# Patient Record
Sex: Female | Born: 1960 | Race: Black or African American | Hispanic: No | Marital: Married | Smoking: Never smoker
Health system: Southern US, Community
[De-identification: ages and names within clinical notes are randomized; demographics above are authoritative.]

## PROBLEM LIST (undated history)

## (undated) DIAGNOSIS — G459 Transient cerebral ischemic attack, unspecified: Secondary | ICD-10-CM

## (undated) DIAGNOSIS — E7800 Pure hypercholesterolemia, unspecified: Secondary | ICD-10-CM

## (undated) DIAGNOSIS — I10 Essential (primary) hypertension: Secondary | ICD-10-CM

## (undated) DIAGNOSIS — Z8489 Family history of other specified conditions: Secondary | ICD-10-CM

## (undated) DIAGNOSIS — E119 Type 2 diabetes mellitus without complications: Secondary | ICD-10-CM

## (undated) DIAGNOSIS — G4733 Obstructive sleep apnea (adult) (pediatric): Secondary | ICD-10-CM

## (undated) DIAGNOSIS — K219 Gastro-esophageal reflux disease without esophagitis: Secondary | ICD-10-CM

## (undated) DIAGNOSIS — M797 Fibromyalgia: Secondary | ICD-10-CM

## (undated) DIAGNOSIS — R011 Cardiac murmur, unspecified: Secondary | ICD-10-CM

## (undated) DIAGNOSIS — E669 Obesity, unspecified: Secondary | ICD-10-CM

## (undated) DIAGNOSIS — F419 Anxiety disorder, unspecified: Secondary | ICD-10-CM

## (undated) DIAGNOSIS — M1990 Unspecified osteoarthritis, unspecified site: Secondary | ICD-10-CM

## (undated) DIAGNOSIS — F329 Major depressive disorder, single episode, unspecified: Secondary | ICD-10-CM

## (undated) DIAGNOSIS — F32A Depression, unspecified: Secondary | ICD-10-CM

## (undated) HISTORY — PX: CHOLECYSTECTOMY: SHX55

## (undated) HISTORY — PX: CARPAL TUNNEL RELEASE: SHX101

## (undated) HISTORY — DX: Anxiety disorder, unspecified: F41.9

## (undated) HISTORY — PX: APPENDECTOMY: SHX54

## (undated) HISTORY — PX: TONSILLECTOMY: SUR1361

## (undated) HISTORY — PX: NASAL SINUS SURGERY: SHX719

## (undated) HISTORY — DX: Fibromyalgia: M79.7

## (undated) HISTORY — PX: KNEE SURGERY: SHX244

---
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
## 1982-12-24 HISTORY — PX: CHOLECYSTECTOMY: SHX55

## 1985-12-24 HISTORY — PX: CARPAL TUNNEL RELEASE: SHX101

## 1990-12-24 HISTORY — PX: NASAL SINUS SURGERY: SHX719

## 1993-12-24 HISTORY — PX: KNEE SURGERY: SHX244

## 2010-12-24 DIAGNOSIS — G459 Transient cerebral ischemic attack, unspecified: Secondary | ICD-10-CM

## 2010-12-24 HISTORY — DX: Transient cerebral ischemic attack, unspecified: G45.9

## 2015-09-21 ENCOUNTER — Inpatient Hospital Stay (HOSPITAL_BASED_OUTPATIENT_CLINIC_OR_DEPARTMENT_OTHER): Payer: Medicaid Other

## 2015-09-21 ENCOUNTER — Encounter (HOSPITAL_BASED_OUTPATIENT_CLINIC_OR_DEPARTMENT_OTHER): Payer: Self-pay

## 2015-09-21 ENCOUNTER — Observation Stay (HOSPITAL_BASED_OUTPATIENT_CLINIC_OR_DEPARTMENT_OTHER)
Admission: EM | Admit: 2015-09-21 | Discharge: 2015-09-22 | Disposition: A | Discharge status: Home / Self Care | Payer: Medicaid Other | Attending: Internal Medicine | Admitting: Internal Medicine

## 2015-09-21 ENCOUNTER — Emergency Department (HOSPITAL_BASED_OUTPATIENT_CLINIC_OR_DEPARTMENT_OTHER): Payer: Medicaid Other

## 2015-09-21 DIAGNOSIS — R079 Chest pain, unspecified: Principal | ICD-10-CM | POA: Diagnosis present

## 2015-09-21 DIAGNOSIS — I10 Essential (primary) hypertension: Secondary | ICD-10-CM

## 2015-09-21 DIAGNOSIS — E109 Type 1 diabetes mellitus without complications: Secondary | ICD-10-CM | POA: Diagnosis not present

## 2015-09-21 DIAGNOSIS — R0789 Other chest pain: Secondary | ICD-10-CM | POA: Diagnosis not present

## 2015-09-21 DIAGNOSIS — E119 Type 2 diabetes mellitus without complications: Secondary | ICD-10-CM

## 2015-09-21 HISTORY — DX: Unspecified osteoarthritis, unspecified site: M19.90

## 2015-09-21 HISTORY — DX: Family history of other specified conditions: Z84.89

## 2015-09-21 HISTORY — DX: Obstructive sleep apnea (adult) (pediatric): G47.33

## 2015-09-21 HISTORY — DX: Pure hypercholesterolemia, unspecified: E78.00

## 2015-09-21 HISTORY — DX: Essential (primary) hypertension: I10

## 2015-09-21 HISTORY — DX: Transient cerebral ischemic attack, unspecified: G45.9

## 2015-09-21 HISTORY — DX: Obesity, unspecified: E66.9

## 2015-09-21 HISTORY — DX: Major depressive disorder, single episode, unspecified: F32.9

## 2015-09-21 HISTORY — DX: Cardiac murmur, unspecified: R01.1

## 2015-09-21 HISTORY — DX: Gastro-esophageal reflux disease without esophagitis: K21.9

## 2015-09-21 HISTORY — DX: Depression, unspecified: F32.A

## 2015-09-21 HISTORY — DX: Type 2 diabetes mellitus without complications: E11.9

## 2015-09-21 LAB — BASIC METABOLIC PANEL
ANION GAP: 6 (ref 5–15)
BUN: 13 mg/dL (ref 6–20)
CHLORIDE: 108 mmol/L (ref 101–111)
CO2: 26 mmol/L (ref 22–32)
Calcium: 8.8 mg/dL — ABNORMAL LOW (ref 8.9–10.3)
Creatinine, Ser: 0.86 mg/dL (ref 0.44–1.00)
GFR calc Af Amer: >60 mL/min (ref >60)
GFR calc non Af Amer: >60 mL/min (ref >60)
GLUCOSE: 142 mg/dL — AB (ref 65–99)
POTASSIUM: 3.7 mmol/L (ref 3.5–5.1)
Sodium: 140 mmol/L (ref 135–145)

## 2015-09-21 LAB — GLUCOSE, CAPILLARY
Glucose-Capillary: 98 mg/dL (ref 65–99)
Glucose-Capillary: 159 mg/dL — ABNORMAL HIGH (ref 65–99)
Glucose-Capillary: 149 mg/dL — ABNORMAL HIGH (ref 65–99)

## 2015-09-21 LAB — CBC WITH DIFFERENTIAL/PLATELET
BASOS ABS: 0 10*3/uL (ref 0.0–0.1)
Basophils Relative: 0 %
Eosinophils Absolute: 0.2 10*3/uL (ref 0.0–0.7)
Eosinophils Relative: 2 %
HEMATOCRIT: 38.7 % (ref 36.0–46.0)
HEMOGLOBIN: 12.9 g/dL (ref 12.0–15.0)
LYMPHS PCT: 42 %
Lymphs Abs: 4 10*3/uL (ref 0.7–4.0)
MCH: 25.3 pg — ABNORMAL LOW (ref 26.0–34.0)
MCHC: 33.3 g/dL (ref 30.0–36.0)
MCV: 75.9 fL — AB (ref 78.0–100.0)
MONO ABS: 0.5 10*3/uL (ref 0.1–1.0)
Monocytes Relative: 5 %
NEUTROS ABS: 4.8 10*3/uL (ref 1.7–7.7)
Neutrophils Relative %: 51 %
Platelets: 299 10*3/uL (ref 150–400)
RBC: 5.1 MIL/uL (ref 3.87–5.11)
RDW: 14.8 % (ref 11.5–15.5)
WBC: 9.5 10*3/uL (ref 4.0–10.5)

## 2015-09-21 LAB — TROPONIN I: Troponin I: <0.03 ng/mL (ref <0.031)

## 2015-09-21 MED ORDER — ACETAMINOPHEN 325 MG PO TABS
650.0000 mg | ORAL_TABLET | ORAL | Status: DC | PRN
Start: 2015-09-21 — End: 2015-09-21

## 2015-09-21 MED ORDER — ONDANSETRON HCL 4 MG PO TABS: 4.0000 mg | ORAL_TABLET | Freq: Four times a day (QID) | ORAL | Status: DC | PRN

## 2015-09-21 MED ORDER — SODIUM CHLORIDE 0.9 % IJ SOLN: 3.0000 mL | INTRAMUSCULAR | Status: DC | PRN

## 2015-09-21 MED ORDER — ONDANSETRON HCL 4 MG/2ML IJ SOLN: 4.0000 mg | Freq: Four times a day (QID) | INTRAMUSCULAR | Status: DC | PRN

## 2015-09-21 MED ORDER — HYDROCHLOROTHIAZIDE 12.5 MG PO CAPS
12.5000 mg | ORAL_CAPSULE | Freq: Every day | ORAL | Status: DC
  Administered 2015-09-21 – 2015-09-22 (×2): 12.5 mg via ORAL
  Filled 2015-09-21 (×2): qty 1

## 2015-09-21 MED ORDER — OXYCODONE HCL 5 MG PO TABS: 5.0000 mg | ORAL_TABLET | ORAL | Status: DC | PRN

## 2015-09-21 MED ORDER — SODIUM CHLORIDE 0.9 % IJ SOLN
3.0000 mL | Freq: Two times a day (BID) | INTRAMUSCULAR | Status: DC
  Administered 2015-09-21 – 2015-09-22 (×2): 3 mL via INTRAVENOUS

## 2015-09-21 MED ORDER — ASPIRIN 81 MG PO CHEW
324.0000 mg | CHEWABLE_TABLET | Freq: Once | ORAL | Status: AC
  Administered 2015-09-21: 324 mg via ORAL
  Filled 2015-09-21: qty 4

## 2015-09-21 MED ORDER — ALPRAZOLAM 0.25 MG PO TABS: 0.2500 mg | ORAL_TABLET | Freq: Two times a day (BID) | ORAL | Status: DC | PRN

## 2015-09-21 MED ORDER — GI COCKTAIL ~~LOC~~
30.0000 mL | Freq: Four times a day (QID) | ORAL | Status: DC | PRN
  Administered 2015-09-21 – 2015-09-22 (×2): 30 mL via ORAL
  Filled 2015-09-21 (×2): qty 30

## 2015-09-21 MED ORDER — ACETAMINOPHEN 650 MG RE SUPP: 650.0000 mg | Freq: Four times a day (QID) | RECTAL | Status: DC | PRN

## 2015-09-21 MED ORDER — LISINOPRIL-HYDROCHLOROTHIAZIDE 10-12.5 MG PO TABS: 1.0000 | ORAL_TABLET | Freq: Every day | ORAL | Status: DC

## 2015-09-21 MED ORDER — MAGNESIUM 30 MG PO TABS: 30.0000 mg | ORAL_TABLET | Freq: Every day | ORAL | Status: DC

## 2015-09-21 MED ORDER — LISINOPRIL 10 MG PO TABS
10.0000 mg | ORAL_TABLET | Freq: Every day | ORAL | Status: DC
  Administered 2015-09-21 – 2015-09-22 (×2): 10 mg via ORAL
  Filled 2015-09-21 (×2): qty 1

## 2015-09-21 MED ORDER — SIMVASTATIN 20 MG PO TABS
20.0000 mg | ORAL_TABLET | Freq: Every day | ORAL | Status: DC
  Administered 2015-09-21 – 2015-09-22 (×2): 20 mg via ORAL
  Filled 2015-09-21 (×2): qty 1

## 2015-09-21 MED ORDER — DOCUSATE SODIUM 100 MG PO CAPS
100.0000 mg | ORAL_CAPSULE | Freq: Two times a day (BID) | ORAL | Status: DC
  Administered 2015-09-21 – 2015-09-22 (×2): 100 mg via ORAL
  Filled 2015-09-21 (×2): qty 1

## 2015-09-21 MED ORDER — SODIUM CHLORIDE 0.9 % IV SOLN: 250.0000 mL | INTRAVENOUS | Status: DC | PRN

## 2015-09-21 MED ORDER — ENOXAPARIN SODIUM 60 MG/0.6ML ~~LOC~~ SOLN
0.5000 mg/kg | SUBCUTANEOUS | Status: DC
  Filled 2015-09-21: qty 0.6

## 2015-09-21 MED ORDER — ENOXAPARIN SODIUM 40 MG/0.4ML ~~LOC~~ SOLN: 40.0000 mg | SUBCUTANEOUS | Status: DC

## 2015-09-21 MED ORDER — INFLUENZA VAC SPLIT QUAD 0.5 ML IM SUSY
0.5000 mL | PREFILLED_SYRINGE | INTRAMUSCULAR | Status: AC
Start: 2015-09-22 — End: 2015-09-22
  Administered 2015-09-22: 0.5 mL via INTRAMUSCULAR
  Filled 2015-09-21: qty 0.5

## 2015-09-21 MED ORDER — MORPHINE SULFATE (PF) 2 MG/ML IV SOLN: 2.0000 mg | INTRAVENOUS | Status: DC | PRN

## 2015-09-21 MED ORDER — INSULIN ASPART 100 UNIT/ML ~~LOC~~ SOLN
0.0000 [IU] | Freq: Three times a day (TID) | SUBCUTANEOUS | Status: DC
  Administered 2015-09-21: 3 [IU] via SUBCUTANEOUS
  Administered 2015-09-22: 2 [IU] via SUBCUTANEOUS

## 2015-09-21 MED ORDER — ACETAMINOPHEN 325 MG PO TABS: 650.0000 mg | ORAL_TABLET | Freq: Four times a day (QID) | ORAL | Status: DC | PRN

## 2015-09-21 MED ORDER — NITROGLYCERIN 2 % TD OINT
TOPICAL_OINTMENT | TRANSDERMAL | Status: AC
  Administered 2015-09-21: 1 [in_us]
  Filled 2015-09-21: qty 1

## 2015-09-21 MED ORDER — ONDANSETRON 4 MG PO TBDP
4.0000 mg | ORAL_TABLET | Freq: Once | ORAL | Status: AC
  Administered 2015-09-21: 4 mg via ORAL
  Filled 2015-09-21: qty 1

## 2015-09-21 MED ORDER — SODIUM CHLORIDE 0.9 % IV SOLN
INTRAVENOUS | Status: DC
Start: 2015-09-21 — End: 2015-09-21

## 2015-09-21 NOTE — ED Notes (Signed)
[facility] has transferred patient at this time. Report called to [facility] at [place].

## 2015-09-21 NOTE — ED Notes (Signed)
Pt c/o lt arm pain waking her from sleep with lt upper back pain "pushing" through to lt chest, states has nausea and dizziness now, was sweating when woke up at 0330am

## 2015-09-21 NOTE — Progress Notes (Addendum)
54 year old female with history of diabetes, hypertension, hyperlipidemia who presents with left arm and chest pain. Patient reports that she woke up at 3:30 AM with pressure-like pain in her left arm. EKG shows no evidence of acute ST elevation. Patient was given full dose aspirin and nitroglycerin paste was applied. Chest pain started approximately 2-1/2 hours prior to evaluation. Initial workup reassuring including one negative troponin. On recheck, patient reports that her chest pain has improved with nitroglycerin. She currently has no chest pain is only experiencing 1/10 arm pain. Patient's heart score is 4Work up negative, heart score 4-5 , admitted to tele

## 2015-09-21 NOTE — H&P (Signed)
[facility] History and Physical  Shannon Rosario DGU:[id number] DOB: 1961-01-20 DOA: 09/21/2015  Referring physician: Emergency Department PCP: [facility]   Chief Complaint: chest pain  HPI: Shannon Rosario is a 54 y.o. female with history of type 2 diabetes, HTN, and obesity who was transferred from [facility] this am with chest pain and left arm pain. The pain originates from her back and radiates into left chest. The chest and left arm pain woke patient up around 3am today. Patient initially though pain was positional but it didn't improve with repositioning. Pain associated with nausea and diaphoresis but no SOB. Currently no chest pain but left arm still has numbness and squeezing sensation from shoulder to elbow. Patient had cardiology workup in [place] in 2012 for chest pain. Workup apparently negative.  She reports an abnormal EKG on annual exam last year with subsequent workup at [facility].    Patient reports an involuntary 15 pound weight loss since May. No other medical complaints.   ED workup: Unremarkable CBC except for microcytosis. Initial troponin negative.   Review of Systems:  Constitutional:  No weight loss, night sweats, Fevers, chills, fatigue.  HEENT:  No headaches, Difficulty swallowing,Tooth/dental problems,Sore throat,  No sneezing, itching, ear ache, nasal congestion, post nasal drip,  Cardio-vascular:  No Orthopnea, PND, swelling in lower extremities, anasarca, dizziness, palpitations  GI:  No heartburn, indigestion, abdominal pain, nausea, vomiting, diarrhea, change in bowel habits, loss of appetite  Resp:  No shortness of breath with exertion or at rest. No excess mucus, no productive cough, No non-productive cough, No coughing up of blood.No change in color of mucus.No wheezing.No chest wall deformity  Skin:  no rash or lesions.  GU:  no dysuria, change in color of urine, no urgency or frequency. No  flank pain.  Musculoskeletal:  No joint pain or swelling. No decreased range of motion.Psych:  No change in mood or affect. No depression or anxiety. No memory loss.   Past Medical History  Diagnosis Date  . Diabetes mellitus without complication   . Hypertension   . Arthritis    Past Surgical History  Procedure Laterality Date  . Cholecystectomy    . Appendectomy    . Cesarean section    . Knee surgery     Social History:  reports that she has never smoked. She does not have any smokeless tobacco history on file. She reports that she does not drink alcohol. Her drug history is not on file.   Lives at home with husband. No assistive devices needed at home.   Allergies  Allergen Reactions  . Dobutamine     FMH: CAD in mother, maternal grandmother  Prior to Admission medications   Medication Sig Start Date End Date Taking? Authorizing Provider  CALCIUM PO Take 1 capsule by mouth daily.   Yes Historical Provider, MD  cholecalciferol (VITAMIN D) 1000 UNITS tablet Take 1,000 Units by mouth daily.   Yes Historical Provider, MD  magnesium 30 MG tablet Take 30 mg by mouth daily.   Yes Historical Provider, MD  Omega-3 Fatty Acids (FISH OIL) 1000 MG CAPS Take 1 capsule by mouth daily.   Yes Historical Provider, MD  vitamin E 100 UNIT capsule Take 100 Units by mouth daily.   Yes Historical Provider, MD  lisinopril-hydrochlorothiazide (PRINZIDE,ZESTORETIC) 10-12.5 MG tablet Take 1 tablet by mouth daily.   Yes Historical Provider, MD  metFORMIN (GLUCOPHAGE) 1000 MG tablet Take 1,000 mg by mouth 2 (two) times  daily with a meal.   Yes Historical Provider, MD  omeprazole (PRILOSEC) 20 MG capsule Take 20 mg by mouth daily.   Yes Historical Provider, MD  simvastatin (ZOCOR) 20 MG tablet Take 20 mg by mouth daily.   Yes Historical Provider, MD   Physical Exam: Filed Vitals:   09/21/15 0556 09/21/15 0725 09/21/15 0734 09/21/15 0900  BP: 153/86 125/88 136/92 120/71  Pulse: 61 62 62 62  Temp:  98.9 F (37.2 C) 98.7 F (37.1 C) 98.7 F (37.1 C) 97.6 F (36.4 C)  TempSrc: Oral Oral  Oral  Resp: 18 16 18    Height: 5\' 9"  (1.753 m)   5\' 9"  (1.753 m)  Weight: 122.471 kg (270 lb)   118.2 kg (260 lb 9.3 oz)  SpO2: 99% 99% 99% 100%    Wt Readings from Last 3 Encounters:  09/21/15 118.2 kg (260 lb 9.3 oz)    General:  Pleasant obese, black female. Appears calm and comfortable Eyes: PERRL, normal lids, irises & conjunctiva ENT: grossly normal hearing, lips & tongue Neck: no LAD, masses or thyromegaly Cardiovascular: RRR, no m/r/g. No LE edema. Telemetry: SR, no arrhythmias  Respiratory: CTA bilaterally, no w/r/r. Normal respiratory effort. Abdomen: soft, ntnd Skin: no rash or induration seen on limited exam Musculoskeletal: grossly normal tone BUE/BLE. No chest wall tenderness Psychiatric: grossly normal mood and affect, speech fluent and appropriate Neurologic: grossly non-focal.          Labs on Admission:  Basic Metabolic Panel:  Recent Labs Lab 09/21/15 0609  NA 140  K 3.7  CL 108  CO2 26  GLUCOSE 142*  BUN 13  CREATININE 0.86  CALCIUM 8.8*    CBC:  Recent Labs Lab 09/21/15 0609  WBC 9.5  NEUTROABS 4.8  HGB 12.9  HCT 38.7  MCV 75.9*  PLT 299   Cardiac Enzymes:  Recent Labs Lab 09/21/15 0609  TROPONINI <0.03   CBG:  Recent Labs Lab 09/21/15 1118  GLUCAP 98    Radiological Exams on Admission: Dg Chest 2 View  09/21/2015   CLINICAL DATA:  Initial evaluation for acute chest pain.  EXAM: CHEST  2 VIEW  COMPARISON:  None.  FINDINGS: The cardiac and mediastinal silhouettes are within normal limits.  The lungs are normally inflated. Minimal left basilar atelectasis. No airspace consolidation, pleural effusion, or pulmonary edema is identified. There is no pneumothorax.  No acute osseous abnormality identified.  IMPRESSION: No active cardiopulmonary disease.   Electronically Signed   By: Jeannine Boga M.D.   On: 09/21/2015 06:21    EKG:  NSR. QT 461ms  Assessment/Plan  Chest pain / left arm pain. Normal initial troponin. Had stress test last year at [facility], will get results. Chest pain score 4-5. Will obtain 2D echo, Cardiology consult called. Fasting lipid panel in am  Diabetes. Obtain Hgb A1c.  Will hold home oral diabeteic medications and place on insulin while inpatient. Start       health healthy / carb modified diet when appropriate to eat.  Hypertension. Stable. Continue home anti-hypertensive medications.   Code Status: full code DVT Prophylaxis: Lovenox Family Communication: Patient alert, oriented and understands plan of care.  Disposition Plan: Discharge home in 24-48 hours.   Time spent: 60 minutes  [facility] Pager [phone]

## 2015-09-21 NOTE — Progress Notes (Signed)
  Echocardiogram 2D Echocardiogram has been performed.  Shannon Rosario 09/21/2015, 2:41 PM

## 2015-09-21 NOTE — ED Notes (Signed)
[facility] here at this time. 

## 2015-09-21 NOTE — ED Provider Notes (Signed)
CSN: [id number]     Arrival date & time 09/21/15  0547 History   First MD Initiated Contact with Patient 09/21/15 [phone]     Chief Complaint  Patient presents with  . Arm Pain     (Consider location/radiation/quality/duration/timing/severity/associated sxs/prior Treatment) HPI  This is a 54 year old female with history of diabetes, hypertension, hyperlipidemia who presents with left arm and chest pain. Patient reports that she woke up at 3:30 AM with pressure-like pain in her left arm. She describes it as "having a blood pressure cuff, arm." She also reports pain in her left upper back "pushing through" to her chest. She has associated nausea and dizziness. Also reported mild diaphoresis and shortness of breath. Reports that she had similar symptoms several years ago and had a full cardiac workup in [place]. This was approximately 5 years ago. Nothing makes the pain better or worse. Currently the pain is 4 out of 10. She denies any history of blood clots, recent hospitalization, recent surgery.  Reports that her mother had multiple heart attacks including one in her early 52s.  Past Medical History  Diagnosis Date  . Diabetes mellitus without complication   . Hypertension   . Arthritis    Past Surgical History  Procedure Laterality Date  . Cholecystectomy    . Appendectomy    . Cesarean section    . Knee surgery     No family history on file. Social History  Substance Use Topics  . Smoking status: Never Smoker   . Smokeless tobacco: None  . Alcohol Use: No   OB History    No data available     Review of Systems  Constitutional: Positive for diaphoresis. Negative for fever.  Respiratory: Positive for chest tightness. Negative for cough and shortness of breath.   Cardiovascular: Positive for chest pain. Negative for leg swelling.  Gastrointestinal: Positive for nausea. Negative for vomiting, abdominal pain and diarrhea.  Genitourinary: Negative for dysuria.  Musculoskeletal:  Negative for back pain.  Skin: Negative for wound.  Neurological: Negative for headaches.  Psychiatric/Behavioral: Negative for confusion.  All other systems reviewed and are negative.     Allergies  Dobutamine  Home Medications   Prior to Admission medications   Medication Sig Start Date End Date Taking? Authorizing Daivion Pape  lisinopril-hydrochlorothiazide (PRINZIDE,ZESTORETIC) 10-12.5 MG tablet Take 1 tablet by mouth daily.   Yes Historical Hurman Ketelsen, MD  metFORMIN (GLUCOPHAGE) 1000 MG tablet Take 1,000 mg by mouth 2 (two) times daily with a meal.   Yes Historical Keymon Mcelroy, MD  omeprazole (PRILOSEC) 20 MG capsule Take 20 mg by mouth daily.   Yes Historical Jadore Mcguffin, MD  simvastatin (ZOCOR) 20 MG tablet Take 20 mg by mouth daily.   Yes Historical Vauda Salvucci, MD   BP 153/86 mmHg  Pulse 61  Temp(Src) 98.9 F (37.2 C) (Oral)  Resp 18  Ht 5\' 9"  (1.753 m)  Wt 270 lb (122.471 kg)  BMI 39.85 kg/m2  SpO2 99% Physical Exam  Constitutional: She is oriented to person, place, and time. She appears well-developed and well-nourished.  Obese  HENT:  Head: Normocephalic and atraumatic.  Cardiovascular: Normal rate, regular rhythm and normal heart sounds.   No murmur heard. Pulmonary/Chest: Effort normal and breath sounds normal. No respiratory distress. She has no wheezes.  Abdominal: Soft. Bowel sounds are normal. There is no tenderness. There is no rebound.  Musculoskeletal: She exhibits no edema.  Neurological: She is alert and oriented to person, place, and time.  Skin: Skin is warm and  dry.  Psychiatric: She has a normal mood and affect.  Nursing note and vitals reviewed.   ED Course  Procedures (including critical care time) Labs Review Labs Reviewed  CBC WITH DIFFERENTIAL/PLATELET - Abnormal; Notable for the following:    MCV 75.9 (*)    MCH 25.3 (*)    All other components within normal limits  BASIC METABOLIC PANEL - Abnormal; Notable for the following:    Glucose, Bld  142 (*)    Calcium 8.8 (*)    All other components within normal limits  TROPONIN I    Imaging Review Dg Chest 2 View  09/21/2015   CLINICAL DATA:  Initial evaluation for acute chest pain.  EXAM: CHEST  2 VIEW  COMPARISON:  None.  FINDINGS: The cardiac and mediastinal silhouettes are within normal limits.  The lungs are normally inflated. Minimal left basilar atelectasis. No airspace consolidation, pleural effusion, or pulmonary edema is identified. There is no pneumothorax.  No acute osseous abnormality identified.  IMPRESSION: No active cardiopulmonary disease.   Electronically Signed   By: Jeannine Boga M.D.   On: 09/21/2015 06:21   I have personally reviewed and evaluated these images and lab results as part of my medical decision-making.   EKG Interpretation   Date/Time:  Wednesday September 21 2015 05:59:54 EDT Ventricular Rate:  66 PR Interval:  130 QRS Duration: 78 QT Interval:  418 QTC Calculation: 438 R Axis:   4 Text Interpretation:  Normal sinus rhythm Low voltage QRS Cannot rule out  Anterior infarct , age undetermined Abnormal ECG No prior for comparison  Confirmed by HORTON  MD, COURTNEY ([id number]) on 09/21/2015 5:59:21 AM      MDM   Final diagnoses:  Chest pain, unspecified chest pain type    Patient presents with chest and arm pain. Nontoxic on exam. Initial vital signs notable for blood pressure 153/86. Patient certainly has risk factors for ACS including hypertension, hyperlipidemia, obesity, and early family history. Description of pain is somewhat concerning. EKG shows no evidence of acute ST elevation. Patient was given full dose aspirin and nitroglycerin paste was applied. Chest pain started approximately 2-1/2 hours prior to evaluation. Initial workup reassuring including one negative troponin. On recheck, patient reports that her chest pain has improved with nitroglycerin. She currently has no chest pain is only experiencing 1/10 arm pain.  Patient's  heart score is 4. I feel best if patient were admitted for observation and formal chest pain rule out especially given her risk factors and improvement of pain with nitroglycerin. Patient is agreeable to plan.    Merryl Hacker, MD 09/21/15 [phone]

## 2015-09-21 NOTE — Consult Note (Signed)
CARDIOLOGY CONSULT NOTE     Patient ID: Shannon Rosario MRN: [id number] DOB/AGE: [birth year] 55 y.o.   Admit date: 09/21/2015 Referring Physician Reyne Dumas MD Primary Physician [facility] Primary Cardiologist N/A Reason for Consultation chest pain  HPI: 54 yo BF seen for evaluation of chest pain at the request of the hospitalist service. She reports that she awoke last night with pain in her left upper arm radiating to her upper left back and chest. She became hot and sweaty. No SOB. She thought initially her pain was from lying on her left side but persisted with change in position. She went to ED and admitted for further evaluation.  She has a history of DM type 2, HTN, HL, and family history of CAD. She reports prior Dobutamine stress test 4 yrs ago that led to a cardiac cath in [place]. This was reportedly normal. She also had extensive cardiac evaluation one year ago at [place] [facility] with a pharmacologic nuclear stress test that was normal.   Past Medical History  Diagnosis Date  . Diabetes mellitus without complication   . Hypertension   . Arthritis   . Hypercholesterolemia   . OSA (obstructive sleep apnea)   . Obesity     Family History  Problem Relation Age of Onset  . CAD Mother     Social History   Social History  . Marital Status: Married    Spouse Name: N/A  . Number of Children: N/A  . Years of Education: N/A   Occupational History  . teacher    Social History Main Topics  . Smoking status: Never Smoker   . Smokeless tobacco: Not on file  . Alcohol Use: No  . Drug Use: Not on file  . Sexual Activity: Not on file   Other Topics Concern  . Not on file   Social History Narrative  . No narrative on file    Past Surgical History  Procedure Laterality Date  . Cholecystectomy    . Appendectomy    . Cesarean section    . Knee surgery       Prescriptions prior to admission  Medication Sig Dispense Refill Last Dose   . CALCIUM PO Take 1 capsule by mouth daily.   09/20/2015 at Unknown time  . cholecalciferol (VITAMIN D) 1000 UNITS tablet Take 1,000 Units by mouth daily.   09/20/2015 at Unknown time  . lisinopril-hydrochlorothiazide (PRINZIDE,ZESTORETIC) 10-12.5 MG tablet Take 1 tablet by mouth daily.   09/20/2015 at Unknown time  . magnesium 30 MG tablet Take 30 mg by mouth daily.   09/20/2015 at Unknown time  . metFORMIN (GLUCOPHAGE) 1000 MG tablet Take 1,000 mg by mouth 2 (two) times daily with a meal.   09/20/2015 at Unknown time  . Omega-3 Fatty Acids (FISH OIL) 1000 MG CAPS Take 1 capsule by mouth daily.   09/20/2015 at Unknown time  . omeprazole (PRILOSEC) 20 MG capsule Take 20 mg by mouth daily.   09/20/2015 at Unknown time  . simvastatin (ZOCOR) 20 MG tablet Take 20 mg by mouth daily.   09/20/2015 at Unknown time  . vitamin E 100 UNIT capsule Take 100 Units by mouth daily.   09/20/2015 at Unknown time      Medication List    ASK your doctor about these medications        CALCIUM PO  Take 1 capsule by mouth daily.     cholecalciferol 1000 UNITS tablet  Commonly known as:  VITAMIN  D  Take 1,000 Units by mouth daily.     Fish Oil 1000 MG Caps  Take 1 capsule by mouth daily.     lisinopril-hydrochlorothiazide 10-12.5 MG tablet  Commonly known as:  PRINZIDE,ZESTORETIC  Take 1 tablet by mouth daily.     magnesium 30 MG tablet  Take 30 mg by mouth daily.     metFORMIN 1000 MG tablet  Commonly known as:  GLUCOPHAGE  Take 1,000 mg by mouth 2 (two) times daily with a meal.     omeprazole 20 MG capsule  Commonly known as:  PRILOSEC  Take 20 mg by mouth daily.     simvastatin 20 MG tablet  Commonly known as:  ZOCOR  Take 20 mg by mouth daily.     vitamin E 100 UNIT capsule  Take 100 Units by mouth daily.        ROS: As noted in HPI. All other systems are reviewed and are negative unless otherwise mentioned.   Physical Exam: Blood pressure 120/71, pulse 62, temperature 97.6 F (36.4 C),  temperature source Oral, resp. rate 18, height 5\' 9"  (1.753 m), weight 118.2 kg (260 lb 9.3 oz), SpO2 100 %.  Current Weight  09/21/15 118.2 kg (260 lb 9.3 oz)    GENERAL:  Well appearing, obese BF in NAD HEENT:  PERRL, EOMI, sclera are clear. Oropharynx is clear. NECK:  No jugular venous distention, carotid upstroke brisk and symmetric, no bruits, no thyromegaly or adenopathy LUNGS:  Clear to auscultation bilaterally CHEST:  Unremarkable. No pain to palpation in anterior chest, shoulder, or back. HEART:  RRR,  PMI not displaced or sustained,S1 and S2 within normal limits, no S3, no S4: no clicks, no rubs, no murmurs ABD:  Soft, nontender. BS +, no masses or bruits. No hepatomegaly, no splenomegaly EXT:  2 + pulses throughout, no edema, no cyanosis no clubbing SKIN:  Warm and dry.  No rashes NEURO:  Alert and oriented x 3. Cranial nerves II through XII intact. PSYCH:  Cognitively intact    Labs:   Lab Results  Component Value Date   WBC 9.5 09/21/2015   HGB 12.9 09/21/2015   HCT 38.7 09/21/2015   MCV 75.9* 09/21/2015   PLT 299 09/21/2015     Recent Labs Lab 09/21/15 0609  NA 140  K 3.7  CL 108  CO2 26  BUN 13  CREATININE 0.86  CALCIUM 8.8*  GLUCOSE 142*   Lab Results  Component Value Date   TROPONINI <0.03 09/21/2015   No results found for: CHOL No results found for: HDL No results found for: LDLCALC No results found for: TRIG No results found for: CHOLHDL No results found for: LDLDIRECT  No results found for: PROBNP No results found for: TSH No results found for: HGBA1C  Radiology: Dg Chest 2 View  09/21/2015   CLINICAL DATA:  Initial evaluation for acute chest pain.  EXAM: CHEST  2 VIEW  COMPARISON:  None.  FINDINGS: The cardiac and mediastinal silhouettes are within normal limits.  The lungs are normally inflated. Minimal left basilar atelectasis. No airspace consolidation, pleural effusion, or pulmonary edema is identified. There is no pneumothorax.  No  acute osseous abnormality identified.  IMPRESSION: No active cardiopulmonary disease.   Electronically Signed   By: Jeannine Boga M.D.   On: 09/21/2015 06:21    EKG: NSR with low voltage. No acute ST- T changes.  ASSESSMENT AND PLAN:  1. Chest pain with atypical features. Patient does have multiple cardiac  risk factors but initial evaluation with troponin and Ecg are normal. She has had extensive cardiac evaluation in the past including a cardiac cath 4 yrs ago and Myoview one year ago that apparently were normal. If this can be confirmed then I think her cardiac risk is low. If troponins are negative she could be DC home on medical therapy. I would reserve further work up only if she has persistent pain. 2. DM type 2.  3. Obesity with OSA. Doesn't use CPAP 4. HTN 5. Hypercholesterolemia on statin.   Signed: Min Collymore [place], [place]  09/21/2015, 1:58 PM

## 2015-09-21 NOTE — ED Notes (Signed)
Report called to [facility] with [facility], eta of 20-25 minutes.

## 2015-09-22 DIAGNOSIS — I10 Essential (primary) hypertension: Secondary | ICD-10-CM | POA: Diagnosis not present

## 2015-09-22 DIAGNOSIS — E1165 Type 2 diabetes mellitus with hyperglycemia: Secondary | ICD-10-CM

## 2015-09-22 DIAGNOSIS — R0789 Other chest pain: Secondary | ICD-10-CM | POA: Diagnosis not present

## 2015-09-22 LAB — HEMOGLOBIN A1C
HEMOGLOBIN A1C: 7.8 % — AB (ref 4.8–5.6)
Mean Plasma Glucose: 177 mg/dL

## 2015-09-22 LAB — LIPID PANEL
CHOL/HDL RATIO: 4.3 ratio
CHOLESTEROL: 147 mg/dL (ref 0–200)
HDL: 34 mg/dL — ABNORMAL LOW (ref >40)
LDL Cholesterol: 73 mg/dL (ref 0–99)
Triglycerides: 200 mg/dL — ABNORMAL HIGH (ref <150)
VLDL: 40 mg/dL (ref 0–40)

## 2015-09-22 LAB — GLUCOSE, CAPILLARY: Glucose-Capillary: 140 mg/dL — ABNORMAL HIGH (ref 65–99)

## 2015-09-22 MED ORDER — ASPIRIN EC 81 MG PO TBEC: 81.0000 mg | DELAYED_RELEASE_TABLET | Freq: Every day | ORAL | Status: DC

## 2015-09-22 MED ORDER — PANTOPRAZOLE SODIUM 40 MG PO TBEC
40.0000 mg | DELAYED_RELEASE_TABLET | Freq: Every day | ORAL | Status: DC
  Administered 2015-09-22: 40 mg via ORAL
  Filled 2015-09-22: qty 1

## 2015-09-22 MED ORDER — PANTOPRAZOLE SODIUM 40 MG PO TBEC: 40.0000 mg | DELAYED_RELEASE_TABLET | Freq: Every day | ORAL | Status: DC

## 2015-09-22 NOTE — Discharge Summary (Signed)
Physician Discharge Summary  Shannon Rosario DUK:[id number] DOB: [birth year] DOA: 09/21/2015  PCP: [facility]  Admit date: 09/21/2015 Discharge date: 09/22/2015  Time spent: 35 minutes  Recommendations for Outpatient Follow-up:  1. Needs better blood sugar control 2. Weight loss encouraged  Discharge Diagnoses:  Principal Problem:   Chest pain Active Problems:   Hypertension   Diabetes   Pain in the chest   Discharge Condition: improved  Diet recommendation: cardiac/diabetic  Filed Weights   09/21/15 0556 09/21/15 0900 09/22/15 0433  Weight: 122.471 kg (270 lb) 118.2 kg (260 lb 9.3 oz) 117.8 kg (259 lb 11.2 oz)    History of present illness:  Shannon Rosario is a 54 y.o. female with history of type 2 diabetes, HTN, and obesity who was transferred from [facility] this am with chest pain and left arm pain. The pain originates from her back and radiates into left chest. The chest and left arm pain woke patient up around 3am today. Patient initially though pain was positional but it didn't improve with repositioning. Pain associated with nausea and diaphoresis but no SOB. Currently no chest pain but left arm still has numbness and squeezing sensation from shoulder to elbow. Patient had cardiology workup in [place] in 2012 for chest pain. Workup apparently negative. She reports an abnormal EKG on annual exam last year with subsequent workup at [facility] Course:  Chest pain with atypical features. Patient does have multiple cardiac risk factors but initial evaluation with troponin and Ecg are normal. She has had extensive cardiac evaluation in the past including a cardiac cath 4 yrs ago and Myoview one year ago that apparently were normal. troponins are negative  DC home on medical therapy.   reserve further work up only if she has persistent pain.   DM type 2.    Obesity with OSA. Doesn't use  CPAP  HTN  Hypercholesterolemia on statin.  Procedures:    Consultations:  cards  Discharge Exam: Filed Vitals:   09/22/15 0433  BP: 149/73  Pulse:   Temp: 97.9 F (36.6 C)  Resp: 20    General: awake, NAD   Discharge Instructions   Discharge Instructions    Diet - low sodium heart healthy    Complete by:  As directed      Diet - low sodium heart healthy    Complete by:  As directed      Diet Carb Modified    Complete by:  As directed      Discharge instructions    Complete by:  As directed   HgbA1C elevated please be sure to monitor blood sugars and bring to PCP -strict diabetic diet     Increase activity slowly    Complete by:  As directed           Current Discharge Medication List    START taking these medications   Details  aspirin EC 81 MG tablet Take 1 tablet (81 mg total) by mouth daily.    pantoprazole (PROTONIX) 40 MG tablet Take 1 tablet (40 mg total) by mouth daily. Qty: 30 tablet, Refills: 0      CONTINUE these medications which have NOT CHANGED   Details  CALCIUM PO Take 1 capsule by mouth daily.    cholecalciferol (VITAMIN D) 1000 UNITS tablet Take 1,000 Units by mouth daily.    lisinopril-hydrochlorothiazide (PRINZIDE,ZESTORETIC) 10-12.5 MG tablet Take 1 tablet by mouth daily.    magnesium 30 MG tablet  Take 30 mg by mouth daily.    metFORMIN (GLUCOPHAGE) 1000 MG tablet Take 1,000 mg by mouth 2 (two) times daily with a meal.    Omega-3 Fatty Acids (FISH OIL) 1000 MG CAPS Take 1 capsule by mouth daily.    simvastatin (ZOCOR) 20 MG tablet Take 20 mg by mouth daily.    vitamin E 100 UNIT capsule Take 100 Units by mouth daily.      STOP taking these medications     omeprazole (PRILOSEC) 20 MG capsule        Allergies  Allergen Reactions  . Dobutamine    Follow-up Information    Follow up with [facility] In 1 week.   Contact information:   [place] [address] [phone]         The results of significant diagnostics from this hospitalization (including imaging, microbiology, ancillary and laboratory) are listed below for reference.    Significant Diagnostic Studies: Dg Chest 2 View  09/21/2015   CLINICAL DATA:  Initial evaluation for acute chest pain.  EXAM: CHEST  2 VIEW  COMPARISON:  None.  FINDINGS: The cardiac and mediastinal silhouettes are within normal limits.  The lungs are normally inflated. Minimal left basilar atelectasis. No airspace consolidation, pleural effusion, or pulmonary edema is identified. There is no pneumothorax.  No acute osseous abnormality identified.  IMPRESSION: No active cardiopulmonary disease.   Electronically Signed   By: Jeannine Boga M.D.   On: 09/21/2015 06:21    Microbiology: No results found for this or any previous visit (from the past 240 hour(s)).   Labs: Basic Metabolic Panel:  Recent Labs Lab 09/21/15 0609  NA 140  K 3.7  CL 108  CO2 26  GLUCOSE 142*  BUN 13  CREATININE 0.86  CALCIUM 8.8*   Liver Function Tests: No results for input(s): AST, ALT, ALKPHOS, BILITOT, PROT, ALBUMIN in the last 168 hours. No results for input(s): LIPASE, AMYLASE in the last 168 hours. No results for input(s): AMMONIA in the last 168 hours. CBC:  Recent Labs Lab 09/21/15 0609  WBC 9.5  NEUTROABS 4.8  HGB 12.9  HCT 38.7  MCV 75.9*  PLT 299   Cardiac Enzymes:  Recent Labs Lab 09/21/15 0609 09/21/15 1350 09/21/15 1839  TROPONINI <0.03 <0.03 <0.03   BNP: BNP (last 3 results) No results for input(s): BNP in the last 8760 hours.  ProBNP (last 3 results) No results for input(s): PROBNP in the last 8760 hours.  CBG:  Recent Labs Lab 09/21/15 1118 09/21/15 1620 09/21/15 2130 09/22/15 0741  GLUCAP 98 159* 149* 140*       Signed:  VANN, JESSICA  [facility] 09/22/2015, 9:42 AM

## 2015-09-22 NOTE — Care Management Note (Signed)
Case Management Note  Patient Details  Name: Shannon Rosario MRN: [id number] Date of Birth: 23-Jun-1961  Subjective/Objective:  Pt admitted for chest pain. Pt has Medicaid. Medicaid card reads as [facility] in [place]. CM did call Helene Kelp to see if hospital f/u could be scheduled. Per Helene Kelp at ext [phone]- not taking any new patients at this time.                   Action/Plan: CM did ask Staff RN to call pt back to provide pt with the number to the [place]. No further needs from CM at this time.   Bethena Roys, RN 09/22/2015, 11:26 AM

## 2015-11-22 ENCOUNTER — Ambulatory Visit: Payer: Medicaid Other | Admitting: Cardiology

## 2015-11-22 DIAGNOSIS — R0989 Other specified symptoms and signs involving the circulatory and respiratory systems: Secondary | ICD-10-CM

## 2016-09-18 ENCOUNTER — Emergency Department (HOSPITAL_BASED_OUTPATIENT_CLINIC_OR_DEPARTMENT_OTHER)
Admission: EM | Admit: 2016-09-18 | Discharge: 2016-09-18 | Disposition: A | Discharge status: Home / Self Care | Payer: BC Managed Care – PPO | Attending: Emergency Medicine | Admitting: Emergency Medicine

## 2016-09-18 ENCOUNTER — Encounter (HOSPITAL_BASED_OUTPATIENT_CLINIC_OR_DEPARTMENT_OTHER): Payer: Self-pay

## 2016-09-18 DIAGNOSIS — I10 Essential (primary) hypertension: Secondary | ICD-10-CM | POA: Diagnosis not present

## 2016-09-18 DIAGNOSIS — T782XXA Anaphylactic shock, unspecified, initial encounter: Secondary | ICD-10-CM | POA: Insufficient documentation

## 2016-09-18 DIAGNOSIS — L299 Pruritus, unspecified: Secondary | ICD-10-CM | POA: Diagnosis present

## 2016-09-18 DIAGNOSIS — Z7984 Long term (current) use of oral hypoglycemic drugs: Secondary | ICD-10-CM | POA: Diagnosis not present

## 2016-09-18 DIAGNOSIS — Z8673 Personal history of transient ischemic attack (TIA), and cerebral infarction without residual deficits: Secondary | ICD-10-CM | POA: Insufficient documentation

## 2016-09-18 DIAGNOSIS — Z79899 Other long term (current) drug therapy: Secondary | ICD-10-CM | POA: Diagnosis not present

## 2016-09-18 DIAGNOSIS — E119 Type 2 diabetes mellitus without complications: Secondary | ICD-10-CM | POA: Diagnosis not present

## 2016-09-18 MED ORDER — FAMOTIDINE IN NACL 20-0.9 MG/50ML-% IV SOLN
20.0000 mg | Freq: Once | INTRAVENOUS | Status: AC
  Administered 2016-09-18: 20 mg via INTRAVENOUS
  Filled 2016-09-18: qty 50

## 2016-09-18 MED ORDER — METHYLPREDNISOLONE SODIUM SUCC 125 MG IJ SOLR
125.0000 mg | Freq: Once | INTRAMUSCULAR | Status: AC
  Administered 2016-09-18: 125 mg via INTRAVENOUS
  Filled 2016-09-18: qty 2

## 2016-09-18 MED ORDER — DIPHENHYDRAMINE HCL 50 MG/ML IJ SOLN
25.0000 mg | Freq: Once | INTRAMUSCULAR | Status: AC
  Administered 2016-09-18: 25 mg via INTRAVENOUS
  Filled 2016-09-18: qty 1

## 2016-09-18 MED ORDER — PREDNISONE 20 MG PO TABS: 40.0000 mg | ORAL_TABLET | Freq: Every day | ORAL | 0 refills | Status: DC

## 2016-09-18 MED ORDER — EPINEPHRINE 0.3 MG/0.3ML IJ SOAJ: 0.3000 mg | Freq: Once | INTRAMUSCULAR | 0 refills | Status: AC | PRN

## 2016-09-18 MED ORDER — EPINEPHRINE 0.3 MG/0.3ML IJ SOAJ
0.3000 mg | Freq: Once | INTRAMUSCULAR | Status: AC
  Administered 2016-09-18: 0.3 mg via INTRAMUSCULAR
  Filled 2016-09-18: qty 0.3

## 2016-09-18 MED ORDER — HYDROXYZINE HCL 25 MG PO TABS
50.0000 mg | ORAL_TABLET | Freq: Once | ORAL | Status: AC
  Administered 2016-09-18: 50 mg via ORAL
  Filled 2016-09-18: qty 2

## 2016-09-18 MED ORDER — HYDROXYZINE HCL 25 MG PO TABS: 25.0000 mg | ORAL_TABLET | Freq: Four times a day (QID) | ORAL | 0 refills | Status: DC | PRN

## 2016-09-18 NOTE — ED Triage Notes (Signed)
Pt c/o itching to top of lt foot then to breast since 2130, states took "3cups" of children benadryl an hour ago then swelling to lips and mouth area 82mins ago. Pt c/o sore dry throat, talking in complete sentences. Pt c/o itching to face/neck/back now; swelling noted to top rt lip

## 2016-09-18 NOTE — ED Notes (Signed)
Patient being monitored on cardiac monitor, continuous pulse ox and NIBP.

## 2016-09-18 NOTE — ED Provider Notes (Signed)
[facility] DEPT [facility] Provider Note   CSN: [id number] Arrival date & time: 09/18/16  0132     History   Chief Complaint Chief Complaint  Patient presents with  . Allergic Reaction    HPI Shannon Rosario is a 55 y.o. female.  HPI  This is a 55 year old female who presents with possible allergic reaction. Patient reports that at approximately 9 PM she noted left foot itching. She is unsure if she was bitten by something. She noticed redness. She progressively developed an itchy rash and facial and lip swelling. Denies shortness of breath. Denies difficulty swallowing.  No known allergies. Denies any recent new foods, medications, soaps, or detergents.  Past Medical History:  Diagnosis Date  . Arthritis   . Depression   . Diabetes mellitus without complication ([place])   . Family history of adverse reaction to anesthesia    My Mother had some complications,I dont remember what   . GERD (gastroesophageal reflux disease)   . Heart murmur   . Hypercholesterolemia   . Hypertension   . Obesity   . OSA (obstructive sleep apnea)   . TIA (transient ischemic attack) 2012    Patient Active Problem List   Diagnosis Date Noted  . Chest pain 09/21/2015  . Hypertension 09/21/2015  . Diabetes ([place]) 09/21/2015  . Pain in the chest     Past Surgical History:  Procedure Laterality Date  . APPENDECTOMY    . CARPAL TUNNEL RELEASE    . CESAREAN SECTION    . CHOLECYSTECTOMY    . KNEE SURGERY    . NASAL SINUS SURGERY    . TONSILLECTOMY      OB History    No data available       Home Medications    Prior to Admission medications   Medication Sig Start Date End Date Taking? Authorizing Provider  CALCIUM PO Take 1 capsule by mouth daily.    Historical Provider, MD  cholecalciferol (VITAMIN D) 1000 UNITS tablet Take 1,000 Units by mouth daily.    Historical Provider, MD  EPINEPHrine 0.3 mg/0.3 mL IJ SOAJ injection Inject 0.3 mLs (0.3 mg total) into the muscle once as  needed (anaphylaxis). 09/18/16   Merryl Hacker, MD  hydrOXYzine (ATARAX/VISTARIL) 25 MG tablet Take 1 tablet (25 mg total) by mouth every 6 (six) hours as needed for itching. 09/18/16   Merryl Hacker, MD  lisinopril-hydrochlorothiazide (PRINZIDE,ZESTORETIC) 10-12.5 MG tablet Take 1 tablet by mouth daily.    Historical Provider, MD  magnesium 30 MG tablet Take 30 mg by mouth daily.    Historical Provider, MD  metFORMIN (GLUCOPHAGE) 1000 MG tablet Take 1,000 mg by mouth 2 (two) times daily with a meal.    Historical Provider, MD  Omega-3 Fatty Acids (FISH OIL) 1000 MG CAPS Take 1 capsule by mouth daily.    Historical Provider, MD  pantoprazole (PROTONIX) 40 MG tablet Take 1 tablet (40 mg total) by mouth daily. 09/22/15   Geradine Girt, DO  predniSONE (DELTASONE) 20 MG tablet Take 2 tablets (40 mg total) by mouth daily. 09/18/16   Merryl Hacker, MD  simvastatin (ZOCOR) 20 MG tablet Take 20 mg by mouth daily.    Historical Provider, MD  vitamin E 100 UNIT capsule Take 100 Units by mouth daily.    Historical Provider, MD    Family History Family History  Problem Relation Age of Onset  . CAD Mother     Social History Social History  Substance Use Topics  .  Smoking status: Never Smoker  . Smokeless tobacco: Never Used  . Alcohol use No     Comment: social     Allergies   Dobutamine   Review of Systems Review of Systems  HENT: Positive for facial swelling. Negative for sore throat.   Respiratory: Negative for shortness of breath and wheezing.   Cardiovascular: Negative for chest pain.  Gastrointestinal: Negative for abdominal pain, nausea and vomiting.  Skin: Positive for rash.  All other systems reviewed and are negative.    Physical Exam Updated Vital Signs BP 146/87   Pulse 80   Temp 98.3 F (36.8 C) (Oral)   Resp 21   Ht 5\' 8"  (1.727 m)   Wt 255 lb (115.7 kg)   SpO2 99%   BMI 38.77 kg/m   Physical Exam  Constitutional: She is oriented to person, place,  and time. She appears well-developed and well-nourished.  Morbidly obese  HENT:  Head: Normocephalic and atraumatic.  Mild swelling noted of the lips and posterior oropharynx, normal phonation, no obvious distress  Eyes: Pupils are equal, round, and reactive to light.  Neck: Normal range of motion.  Cardiovascular: Normal rate, regular rhythm and normal heart sounds.   No murmur heard. Pulmonary/Chest: Effort normal and breath sounds normal. No respiratory distress. She has no wheezes.  Abdominal: Soft. Bowel sounds are normal. There is no tenderness. There is no guarding.  Neurological: She is alert and oriented to person, place, and time.  Skin: Skin is warm and dry. Rash noted.  Diffuse urticarial rash  Psychiatric: She has a normal mood and affect.  Nursing note and vitals reviewed.    ED Treatments / Results  Labs (all labs ordered are listed, but only abnormal results are displayed) Labs Reviewed - No data to display  EKG  EKG Interpretation None       Radiology No results found.  Procedures Procedures (including critical care time)  CRITICAL CARE Performed by: Merryl Hacker   Total critical care time: 30 minutes  Critical care time was exclusive of separately billable procedures and treating other patients.  Critical care was necessary to treat or prevent imminent or life-threatening deterioration.  Critical care was time spent personally by me on the following activities: development of treatment plan with patient and/or surrogate as well as nursing, discussions with consultants, evaluation of patient's response to treatment, examination of patient, obtaining history from patient or surrogate, ordering and performing treatments and interventions, ordering and review of laboratory studies, ordering and review of radiographic studies, pulse oximetry and re-evaluation of patient's condition.   Medications Ordered in ED Medications  methylPREDNISolone  sodium succinate (SOLU-MEDROL) 125 mg/2 mL injection 125 mg (125 mg Intravenous Given 09/18/16 0216)  diphenhydrAMINE (BENADRYL) injection 25 mg (25 mg Intravenous Given 09/18/16 0215)  famotidine (PEPCID) IVPB 20 mg premix (0 mg Intravenous Stopped 09/18/16 0249)  EPINEPHrine (EPI-PEN) injection 0.3 mg (0.3 mg Intramuscular Given 09/18/16 0213)  hydrOXYzine (ATARAX/VISTARIL) tablet 50 mg (50 mg Oral Given 09/18/16 0251)     Initial Impression / Assessment and Plan / ED Course  I have reviewed the triage vital signs and the nursing notes.  Pertinent labs & imaging results that were available during my care of the patient were reviewed by me and considered in my medical decision making (see chart for details).  Clinical Course  Comment By Time  Still itching.  No worsening of oral or pharyngeal swelling. Continue to monitor Merryl Hacker, MD 09/26 0247  Improved.  Hives improved continued mild facial swelling.  Continue to monitor. Merryl Hacker, MD 09/26 [phone]    Patient presents today with allergic reaction. She has evidence of facial and oral pharyngeal swelling as well as diffuse urticaria. Vital signs are reassuring. No wheezing on exam. Given facial and oral swelling, patient was treated for anaphylaxis with Benadryl, Solu-Medrol and, Pepcid, and epinephrine.  She was observed in the emergency department for 5 hours. Symptoms improved gradually. At recheck prior to discharge, patient had improved rash and diminished oral swelling. She reports that she feels much better. Will discharge home with prednisone, Atarax, and an EpiPen. She was given instructions regarding EpiPen use.  After history, exam, and medical workup I feel the patient has been appropriately medically screened and is safe for discharge home. Pertinent diagnoses were discussed with the patient. Patient was given return precautions.   Final Clinical Impressions(s) / ED Diagnoses   Final diagnoses:  Anaphylaxis,  initial encounter    New Prescriptions New Prescriptions   EPINEPHRINE 0.3 MG/0.3 ML IJ SOAJ INJECTION    Inject 0.3 mLs (0.3 mg total) into the muscle once as needed (anaphylaxis).   HYDROXYZINE (ATARAX/VISTARIL) 25 MG TABLET    Take 1 tablet (25 mg total) by mouth every 6 (six) hours as needed for itching.   PREDNISONE (DELTASONE) 20 MG TABLET    Take 2 tablets (40 mg total) by mouth daily.     Merryl Hacker, MD 09/18/16 [phone]

## 2016-09-19 ENCOUNTER — Encounter (HOSPITAL_BASED_OUTPATIENT_CLINIC_OR_DEPARTMENT_OTHER): Payer: Self-pay | Admitting: *Deleted

## 2016-09-19 ENCOUNTER — Emergency Department (HOSPITAL_BASED_OUTPATIENT_CLINIC_OR_DEPARTMENT_OTHER)
Admission: EM | Admit: 2016-09-19 | Discharge: 2016-09-19 | Disposition: A | Discharge status: Home / Self Care | Payer: BC Managed Care – PPO | Attending: Emergency Medicine | Admitting: Emergency Medicine

## 2016-09-19 DIAGNOSIS — E119 Type 2 diabetes mellitus without complications: Secondary | ICD-10-CM | POA: Insufficient documentation

## 2016-09-19 DIAGNOSIS — Z7984 Long term (current) use of oral hypoglycemic drugs: Secondary | ICD-10-CM | POA: Insufficient documentation

## 2016-09-19 DIAGNOSIS — T7840XD Allergy, unspecified, subsequent encounter: Secondary | ICD-10-CM | POA: Diagnosis not present

## 2016-09-19 DIAGNOSIS — R21 Rash and other nonspecific skin eruption: Secondary | ICD-10-CM | POA: Insufficient documentation

## 2016-09-19 DIAGNOSIS — I10 Essential (primary) hypertension: Secondary | ICD-10-CM | POA: Diagnosis not present

## 2016-09-19 DIAGNOSIS — Z79899 Other long term (current) drug therapy: Secondary | ICD-10-CM | POA: Diagnosis not present

## 2016-09-19 DIAGNOSIS — L509 Urticaria, unspecified: Secondary | ICD-10-CM | POA: Diagnosis not present

## 2016-09-19 MED ORDER — DIPHENHYDRAMINE HCL 25 MG PO TABS: 25.0000 mg | ORAL_TABLET | Freq: Three times a day (TID) | ORAL | 0 refills | Status: DC

## 2016-09-19 MED ORDER — FAMOTIDINE IN NACL 20-0.9 MG/50ML-% IV SOLN
20.0000 mg | Freq: Once | INTRAVENOUS | Status: AC
  Administered 2016-09-19: 20 mg via INTRAVENOUS
  Filled 2016-09-19: qty 50

## 2016-09-19 MED ORDER — RANITIDINE HCL 150 MG PO CAPS: 150.0000 mg | ORAL_CAPSULE | Freq: Every day | ORAL | 0 refills | Status: DC

## 2016-09-19 MED ORDER — METHYLPREDNISOLONE SODIUM SUCC 125 MG IJ SOLR
125.0000 mg | Freq: Once | INTRAMUSCULAR | Status: AC
  Administered 2016-09-19: 125 mg via INTRAVENOUS
  Filled 2016-09-19: qty 2

## 2016-09-19 MED ORDER — DIPHENHYDRAMINE HCL 50 MG/ML IJ SOLN
25.0000 mg | Freq: Once | INTRAMUSCULAR | Status: AC
  Administered 2016-09-19: 25 mg via INTRAVENOUS
  Filled 2016-09-19: qty 1

## 2016-09-19 MED FILL — raNITIdine HCL 150 MG TABS: 150 | 7 days supply | Qty: 7 | Fill #0

## 2016-09-19 MED FILL — BANOPHEN 25 MG CAPSULE: 25 | 100 days supply | Qty: 100 | Fill #0

## 2016-09-19 NOTE — ED Notes (Signed)
Denies taking meds PTA d/t going into work and not wanting to be drowsy.

## 2016-09-19 NOTE — ED Notes (Signed)
MD at bedside. 

## 2016-09-19 NOTE — ED Triage Notes (Signed)
Pt reports being evaluated here yesterday for allergic reaction. Reports she began itching this morning around 0230 and it has gotten progressively worse since then. Pt has generalized itching and hives forming at this time. Denies swelling in lips/tongue/throat but reports painful swallowing. Denies sob. Reports mild upper chest pressure.

## 2016-09-19 NOTE — ED Notes (Signed)
2 unsuccessful IV attempts -- blood return noted in both; both infiltrated immediately upon flushing with saline. RT at bedside assessing for IV site.

## 2016-09-19 NOTE — ED Notes (Signed)
Pt able to tolerate drinking water without difficulty. Reports it's still painful but not as bad as when she came in.

## 2016-09-19 NOTE — ED Provider Notes (Signed)
[facility] Provider Note   CSN: [id number] Arrival date & time: 09/19/16  [id number]     History   Chief Complaint Chief Complaint  Patient presents with  . Allergic Reaction    HPI Shannon Rosario is a 55 y.o. female.  The history is provided by the patient.  Allergic Reaction  Presenting symptoms: difficulty swallowing, itching and rash   Presenting symptoms: no swelling and no wheezing   Severity:  Moderate Duration:  3 hours Context comment:  Unknown Relieved by:  Nothing Exacerbated by: unknown.  Patient was seen here 2 days ago for the same however at that time noted to have lip and oral swelling requiring epinephrine, steroids, Benadryl, Pepcid. His symptoms improved and after 5 hours of observation was able to be discharged home. She was prescribed steroids and instructed to take Benadryl. Patient reports taking the steroids however has not been taking Benadryl as a mixture sleepy.  Pain with swallowing was present during the last encounter, but worsened yesterday. States that pain feels lower down in her throat.  Past Medical History:  Diagnosis Date  . Arthritis   . Depression   . Diabetes mellitus without complication ([place])   . Family history of adverse reaction to anesthesia    My Mother had some complications,I dont remember what   . GERD (gastroesophageal reflux disease)   . Heart murmur   . Hypercholesterolemia   . Hypertension   . Obesity   . OSA (obstructive sleep apnea)   . TIA (transient ischemic attack) 2012    Patient Active Problem List   Diagnosis Date Noted  . Chest pain 09/21/2015  . Hypertension 09/21/2015  . Diabetes ([place]) 09/21/2015  . Pain in the chest     Past Surgical History:  Procedure Laterality Date  . APPENDECTOMY    . CARPAL TUNNEL RELEASE    . CESAREAN SECTION    . CHOLECYSTECTOMY    . KNEE SURGERY    . NASAL SINUS SURGERY    . TONSILLECTOMY      OB History    No data available       Home  Medications    Prior to Admission medications   Medication Sig Start Date End Date Taking? Authorizing Provider  CALCIUM PO Take 1 capsule by mouth daily.   Yes Historical Provider, MD  cholecalciferol (VITAMIN D) 1000 UNITS tablet Take 1,000 Units by mouth daily.   Yes Historical Provider, MD  EPINEPHrine 0.3 mg/0.3 mL IJ SOAJ injection Inject 0.3 mLs (0.3 mg total) into the muscle once as needed (anaphylaxis). 09/18/16  Yes Merryl Hacker, MD  hydrOXYzine (ATARAX/VISTARIL) 25 MG tablet Take 1 tablet (25 mg total) by mouth every 6 (six) hours as needed for itching. 09/18/16  Yes Merryl Hacker, MD  lisinopril-hydrochlorothiazide (PRINZIDE,ZESTORETIC) 10-12.5 MG tablet Take 1 tablet by mouth daily.   Yes Historical Provider, MD  magnesium 30 MG tablet Take 30 mg by mouth daily.   Yes Historical Provider, MD  metFORMIN (GLUCOPHAGE) 1000 MG tablet Take 1,000 mg by mouth 2 (two) times daily with a meal.   Yes Historical Provider, MD  Omega-3 Fatty Acids (FISH OIL) 1000 MG CAPS Take 1 capsule by mouth daily.   Yes Historical Provider, MD  pantoprazole (PROTONIX) 40 MG tablet Take 1 tablet (40 mg total) by mouth daily. 09/22/15  Yes Geradine Girt, DO  predniSONE (DELTASONE) 20 MG tablet Take 2 tablets (40 mg total) by mouth daily. 09/18/16  Yes Merryl Hacker,  MD  simvastatin (ZOCOR) 20 MG tablet Take 20 mg by mouth daily.   Yes Historical Provider, MD  vitamin E 100 UNIT capsule Take 100 Units by mouth daily.   Yes Historical Provider, MD  diphenhydrAMINE (BENADRYL) 25 MG tablet Take 1 tablet (25 mg total) by mouth every 8 (eight) hours. 09/19/16 09/23/16  Fatima Blank, MD  ranitidine (ZANTAC) 150 MG capsule Take 1 capsule (150 mg total) by mouth daily. 09/19/16 09/26/16  Fatima Blank, MD    Family History Family History  Problem Relation Age of Onset  . CAD Mother     Social History Social History  Substance Use Topics  . Smoking status: Never Smoker  . Smokeless  tobacco: Never Used  . Alcohol use Yes     Comment: social     Allergies   Dobutamine and Pineapple   Review of Systems Review of Systems  Constitutional: Negative for chills and fever.  HENT: Positive for trouble swallowing. Negative for ear pain and sore throat.   Eyes: Negative for pain and visual disturbance.  Respiratory: Negative for cough, chest tightness, shortness of breath, wheezing and stridor.   Cardiovascular: Negative for chest pain and palpitations.  Gastrointestinal: Negative for abdominal pain and vomiting.  Genitourinary: Negative for dysuria and hematuria.  Musculoskeletal: Negative for arthralgias and back pain.  Skin: Positive for itching and rash. Negative for color change.  Neurological: Negative for seizures and syncope.  All other systems reviewed and are negative.    Physical Exam Updated Vital Signs BP (!) 139/104 (BP Location: Left Arm)   Pulse 83   Temp 98.2 F (36.8 C) (Oral)   Resp 18   Ht 5\' 8"  (1.727 m)   Wt 255 lb (115.7 kg)   SpO2 98%   BMI 38.77 kg/m   Physical Exam  Constitutional: She is oriented to person, place, and time. She appears well-developed and well-nourished. No distress.  HENT:  Head: Normocephalic and atraumatic.  Nose: Nose normal.  Mouth/Throat: Oropharynx is clear and moist and mucous membranes are normal. No posterior oropharyngeal edema.  Abnormal phonation  Eyes: Conjunctivae and EOM are normal. Pupils are equal, round, and reactive to light. Right eye exhibits no discharge. Left eye exhibits no discharge. No scleral icterus.  Neck: Normal range of motion. Neck supple.  Cardiovascular: Normal rate and regular rhythm.  Exam reveals no gallop and no friction rub.   No murmur heard. Pulmonary/Chest: Effort normal and breath sounds normal. No stridor. No respiratory distress. She has no rales.  Abdominal: Soft. She exhibits no distension. There is no tenderness.  Musculoskeletal: She exhibits no edema or  tenderness.  Neurological: She is alert and oriented to person, place, and time.  Skin: Skin is warm and dry. Rash noted. Rash is urticarial (on neck, torso, and BUE). She is not diaphoretic. No erythema.  Psychiatric: She has a normal mood and affect.  Vitals reviewed.    ED Treatments / Results  Labs (all labs ordered are listed, but only abnormal results are displayed) Labs Reviewed - No data to display  EKG  EKG Interpretation None       Radiology No results found.  Procedures Procedures (including critical care time)  Medications Ordered in ED Medications  diphenhydrAMINE (BENADRYL) injection 25 mg (25 mg Intravenous Given 09/19/16 0934)  methylPREDNISolone sodium succinate (SOLU-MEDROL) 125 mg/2 mL injection 125 mg (125 mg Intravenous Given 09/19/16 0938)  famotidine (PEPCID) IVPB 20 mg premix (0 mg Intravenous Stopped 09/19/16 1021)  Initial Impression / Assessment and Plan / ED Course  I have reviewed the triage vital signs and the nursing notes.  Pertinent labs & imaging results that were available during my care of the patient were reviewed by me and considered in my medical decision making (see chart for details).  Clinical Course    55 y.o. female here with pruritic rash. No known triggers or exposures. No respiratory, GI, or neurologic symptoms to suggest anaphylaxis. She reports recent infectious (URI) symptoms suggestive of possible viral urticaria.    On exam, there is no evidence of oral swelling or airway compromise.   Given Benadryl, H2 blocker, and steroids. Monitored for 4 hours. Symptoms new complete resolution of her urticaria.  Feel she is safe for discharge with strict return precautions. Given Rx for zantac and instructed to continue her steroid and Benadryl course..   Final Clinical Impressions(s) / ED Diagnoses   Final diagnoses:  Allergic reaction, subsequent encounter  Urticaria  Rash   Disposition: Discharge  Condition:  Good  I have discussed the results, Dx and Tx plan with the patient who expressed understanding and agree(s) with the plan. Discharge instructions discussed at great length. The patient was given strict return precautions who verbalized understanding of the instructions. No further questions at time of discharge.    Discharge Medication List as of 09/19/2016 12:51 PM    START taking these medications   Details  diphenhydrAMINE (BENADRYL) 25 MG tablet Take 1 tablet (25 mg total) by mouth every 8 (eight) hours., Starting Wed 09/19/2016, Until Sun 09/23/2016, Print    ranitidine (ZANTAC) 150 MG capsule Take 1 capsule (150 mg total) by mouth daily., Starting Wed 09/19/2016, Until Wed 09/26/2016, Print        Follow Up: [facility] [address] [place] [phone]  call to establish care with a primary care doctor to see if they can help find an allergist for continued workup      Fatima Blank, MD 09/19/16 1659

## 2016-09-27 ENCOUNTER — Ambulatory Visit: Payer: Self-pay | Admitting: Allergy and Immunology

## 2016-09-30 ENCOUNTER — Emergency Department (HOSPITAL_BASED_OUTPATIENT_CLINIC_OR_DEPARTMENT_OTHER)
Admission: EM | Admit: 2016-09-30 | Discharge: 2016-09-30 | Disposition: A | Discharge status: Home / Self Care | Payer: BC Managed Care – PPO | Attending: Emergency Medicine | Admitting: Emergency Medicine

## 2016-09-30 ENCOUNTER — Encounter (HOSPITAL_BASED_OUTPATIENT_CLINIC_OR_DEPARTMENT_OTHER): Payer: Self-pay | Admitting: Emergency Medicine

## 2016-09-30 ENCOUNTER — Emergency Department (HOSPITAL_BASED_OUTPATIENT_CLINIC_OR_DEPARTMENT_OTHER): Payer: BC Managed Care – PPO

## 2016-09-30 ENCOUNTER — Other Ambulatory Visit: Payer: Self-pay

## 2016-09-30 DIAGNOSIS — E1165 Type 2 diabetes mellitus with hyperglycemia: Secondary | ICD-10-CM | POA: Diagnosis not present

## 2016-09-30 DIAGNOSIS — R202 Paresthesia of skin: Secondary | ICD-10-CM | POA: Diagnosis not present

## 2016-09-30 DIAGNOSIS — R739 Hyperglycemia, unspecified: Secondary | ICD-10-CM

## 2016-09-30 DIAGNOSIS — Z79899 Other long term (current) drug therapy: Secondary | ICD-10-CM | POA: Insufficient documentation

## 2016-09-30 DIAGNOSIS — R200 Anesthesia of skin: Secondary | ICD-10-CM

## 2016-09-30 DIAGNOSIS — Z7984 Long term (current) use of oral hypoglycemic drugs: Secondary | ICD-10-CM | POA: Diagnosis not present

## 2016-09-30 LAB — CBC
HEMATOCRIT: 37.4 % (ref 36.0–46.0)
Hemoglobin: 12.3 g/dL (ref 12.0–15.0)
MCH: 25.4 pg — ABNORMAL LOW (ref 26.0–34.0)
MCHC: 32.9 g/dL (ref 30.0–36.0)
MCV: 77.3 fL — ABNORMAL LOW (ref 78.0–100.0)
Platelets: 243 10*3/uL (ref 150–400)
RBC: 4.84 MIL/uL (ref 3.87–5.11)
RDW: 14.7 % (ref 11.5–15.5)
WBC: 8.1 10*3/uL (ref 4.0–10.5)

## 2016-09-30 LAB — URINALYSIS, ROUTINE W REFLEX MICROSCOPIC
Bilirubin Urine: NEGATIVE
Glucose, UA: >1000 mg/dL — AB
HGB URINE DIPSTICK: NEGATIVE
Ketones, ur: NEGATIVE mg/dL
LEUKOCYTES UA: NEGATIVE
Nitrite: NEGATIVE
PROTEIN: NEGATIVE mg/dL
SPECIFIC GRAVITY, URINE: 1.027 (ref 1.005–1.030)
pH: 5 (ref 5.0–8.0)

## 2016-09-30 LAB — RAPID URINE DRUG SCREEN, HOSP PERFORMED
Amphetamines: NOT DETECTED
BARBITURATES: NOT DETECTED
BENZODIAZEPINES: NOT DETECTED
Cocaine: NOT DETECTED
Opiates: NOT DETECTED
Tetrahydrocannabinol: NOT DETECTED

## 2016-09-30 LAB — URINE MICROSCOPIC-ADD ON

## 2016-09-30 LAB — PROTIME-INR
INR: 0.89
Prothrombin Time: 12 seconds (ref 11.4–15.2)

## 2016-09-30 LAB — DIFFERENTIAL
BASOS ABS: 0 10*3/uL (ref 0.0–0.1)
BASOS PCT: 0 %
Eosinophils Absolute: 0.2 10*3/uL (ref 0.0–0.7)
Eosinophils Relative: 2 %
Lymphocytes Relative: 38 %
Lymphs Abs: 3 10*3/uL (ref 0.7–4.0)
MONOS PCT: 4 %
Monocytes Absolute: 0.3 10*3/uL (ref 0.1–1.0)
NEUTROS ABS: 4.6 10*3/uL (ref 1.7–7.7)
Neutrophils Relative %: 56 %

## 2016-09-30 LAB — COMPREHENSIVE METABOLIC PANEL
ALT: 30 U/L (ref 14–54)
AST: 19 U/L (ref 15–41)
Albumin: 3.7 g/dL (ref 3.5–5.0)
Alkaline Phosphatase: 57 U/L (ref 38–126)
Anion gap: 8 (ref 5–15)
BUN: 11 mg/dL (ref 6–20)
CALCIUM: 8.8 mg/dL — AB (ref 8.9–10.3)
CHLORIDE: 106 mmol/L (ref 101–111)
CO2: 25 mmol/L (ref 22–32)
CREATININE: 0.83 mg/dL (ref 0.44–1.00)
GFR calc Af Amer: >60 mL/min (ref >60)
Glucose, Bld: 278 mg/dL — ABNORMAL HIGH (ref 65–99)
Potassium: 3.3 mmol/L — ABNORMAL LOW (ref 3.5–5.1)
SODIUM: 139 mmol/L (ref 135–145)
TOTAL PROTEIN: 6.5 g/dL (ref 6.5–8.1)
Total Bilirubin: 0.4 mg/dL (ref 0.3–1.2)

## 2016-09-30 LAB — APTT: APTT: 24 s (ref 24–36)

## 2016-09-30 LAB — TROPONIN I: Troponin I: <0.03 ng/mL (ref <0.03)

## 2016-09-30 LAB — ETHANOL

## 2016-09-30 NOTE — ED Notes (Signed)
Pt made aware to return if symptoms worsen or if any life threatening symptoms occur.   

## 2016-09-30 NOTE — ED Provider Notes (Signed)
[facility] Provider Note   CSN: [id number] Arrival date & time: 09/30/16  1237     History   Chief Complaint Chief Complaint  Patient presents with  . Numbness    HPI Shannon Rosario is a 55 y.o. female.  HPI Pt first started numbness on Thursday after work.  She touched her face and noticed it felt weird.  She could still feel but it wasn't normal.  It involved her teeth and lips.  It felt like she had a novacaine shot at the dentist.  [facility] also head a headache.  That persisted on Friday.  Yesterday she felt fatigued.  Today when she woke up she felt strange and it would not resolve.  The numbness involved the entire left side not just the face.  She does feel weaker on the left side..  No trouble with the speech.  Still able to walk.   Pt states she has history of a TIA.  She was getting a stress tests and was given dobutamine.  It increased her BP and she developed sx. she was told she had a [place].  She did have an MRI at the time  .  This occurred approx 6-7 years ago Past Medical History:  Diagnosis Date  . Arthritis   . Depression   . Diabetes mellitus without complication ([place])   . Family history of adverse reaction to anesthesia    My Mother had some complications,I dont remember what   . GERD (gastroesophageal reflux disease)   . Heart murmur   . Hypercholesterolemia   . Hypertension   . Obesity   . OSA (obstructive sleep apnea)   . TIA (transient ischemic attack) 2012    Patient Active Problem List   Diagnosis Date Noted  . Chest pain 09/21/2015  . Hypertension 09/21/2015  . Diabetes ([place]) 09/21/2015  . Pain in the chest     Past Surgical History:  Procedure Laterality Date  . APPENDECTOMY    . CARPAL TUNNEL RELEASE    . CESAREAN SECTION    . CHOLECYSTECTOMY    . KNEE SURGERY    . NASAL SINUS SURGERY    . TONSILLECTOMY      OB History    No data available       Home Medications    Prior to Admission medications   Medication  Sig Start Date End Date Taking? Authorizing Provider  CALCIUM PO Take 1 capsule by mouth daily.    Historical Provider, MD  cholecalciferol (VITAMIN D) 1000 UNITS tablet Take 1,000 Units by mouth daily.    Historical Provider, MD  diphenhydrAMINE (BENADRYL) 25 MG tablet Take 1 tablet (25 mg total) by mouth every 8 (eight) hours. 09/19/16 09/23/16  Fatima Blank, MD  EPINEPHrine 0.3 mg/0.3 mL IJ SOAJ injection Inject 0.3 mLs (0.3 mg total) into the muscle once as needed (anaphylaxis). 09/18/16   Merryl Hacker, MD  hydrOXYzine (ATARAX/VISTARIL) 25 MG tablet Take 1 tablet (25 mg total) by mouth every 6 (six) hours as needed for itching. 09/18/16   Merryl Hacker, MD  lisinopril-hydrochlorothiazide (PRINZIDE,ZESTORETIC) 10-12.5 MG tablet Take 1 tablet by mouth daily.    Historical Provider, MD  magnesium 30 MG tablet Take 30 mg by mouth daily.    Historical Provider, MD  metFORMIN (GLUCOPHAGE) 1000 MG tablet Take 1,000 mg by mouth 2 (two) times daily with a meal.    Historical Provider, MD  Omega-3 Fatty Acids (FISH OIL) 1000 MG CAPS Take 1 capsule  by mouth daily.    Historical Provider, MD  pantoprazole (PROTONIX) 40 MG tablet Take 1 tablet (40 mg total) by mouth daily. 09/22/15   Geradine Girt, DO  predniSONE (DELTASONE) 20 MG tablet Take 2 tablets (40 mg total) by mouth daily. 09/18/16   Merryl Hacker, MD  ranitidine (ZANTAC) 150 MG capsule Take 1 capsule (150 mg total) by mouth daily. 09/19/16 09/26/16  Fatima Blank, MD  simvastatin (ZOCOR) 20 MG tablet Take 20 mg by mouth daily.    Historical Provider, MD  vitamin E 100 UNIT capsule Take 100 Units by mouth daily.    Historical Provider, MD    Family History Family History  Problem Relation Age of Onset  . CAD Mother     Social History Social History  Substance Use Topics  . Smoking status: Never Smoker  . Smokeless tobacco: Never Used  . Alcohol use Yes     Comment: social     Allergies   Dobutamine and  Pineapple   Review of Systems Review of Systems  All other systems reviewed and are negative.    Physical Exam Updated Vital Signs BP 137/84   Pulse 75   Temp 98.1 F (36.7 C) (Oral)   Resp 16   Ht 5\' 8"  (1.727 m)   Wt 115.7 kg   SpO2 100%   BMI 38.77 kg/m   Physical Exam  Constitutional: She is oriented to person, place, and time. She appears well-developed and well-nourished. No distress.  HENT:  Head: Normocephalic and atraumatic.  Right Ear: External ear normal.  Left Ear: External ear normal.  Mouth/Throat: Oropharynx is clear and moist.  Eyes: Conjunctivae are normal. Right eye exhibits no discharge. Left eye exhibits no discharge. No scleral icterus.  Neck: Neck supple. No tracheal deviation present.  Cardiovascular: Normal rate, regular rhythm and intact distal pulses.   Pulmonary/Chest: Effort normal and breath sounds normal. No stridor. No respiratory distress. She has no wheezes. She has no rales.  Abdominal: Soft. Bowel sounds are normal. She exhibits no distension. There is no tenderness. There is no rebound and no guarding.  Musculoskeletal: She exhibits no edema or tenderness.  Neurological: She is alert and oriented to person, place, and time. She has normal strength. No cranial nerve deficit (No facial droop, extraocular movements intact, tongue midline ) or sensory deficit. She exhibits normal muscle tone. She displays no seizure activity. Coordination normal.  No pronator drift bilateral upper extrem, able to hold both legs off bed for 5 seconds, sensation intact in all extremities, no visual field cuts, no left or right sided neglect, normal finger-nose exam bilaterally, no nystagmus noted  Sensation is intact on the left side but feels different than the other side, difficulty discriminating sharp/dull  Skin: Skin is warm and dry. No rash noted.  Psychiatric: She has a normal mood and affect.  Nursing note and vitals reviewed.    ED Treatments /  Results  Labs (all labs ordered are listed, but only abnormal results are displayed) Labs Reviewed  CBC - Abnormal; Notable for the following:       Result Value   MCV 77.3 (*)    MCH 25.4 (*)    All other components within normal limits  COMPREHENSIVE METABOLIC PANEL - Abnormal; Notable for the following:    Potassium 3.3 (*)    Glucose, Bld 278 (*)    Calcium 8.8 (*)    All other components within normal limits  URINALYSIS, ROUTINE W REFLEX  MICROSCOPIC (NOT AT [facility]) - Abnormal; Notable for the following:    Glucose, UA >1000 (*)    All other components within normal limits  URINE MICROSCOPIC-ADD ON - Abnormal; Notable for the following:    Squamous Epithelial / LPF 0-5 (*)    Bacteria, UA RARE (*)    Crystals URIC ACID CRYSTALS (*)    All other components within normal limits  ETHANOL  PROTIME-INR  APTT  DIFFERENTIAL  URINE RAPID DRUG SCREEN, HOSP PERFORMED  TROPONIN I    EKG  EKG Interpretation  Date/Time:  Sunday September 30 2016 12:50:22 EDT Ventricular Rate:  88 PR Interval:  138 QRS Duration: 68 QT Interval:  354 QTC Calculation: 428 R Axis:   2 Text Interpretation:  Normal sinus rhythm Low voltage QRS Cannot rule out Anterior infarct , age undetermined Abnormal ECG No significant change since last tracing Confirmed by Avamae Dehaan  MD-J, Geryl Dohn [phone]) on 09/30/2016 1:15:17 PM       Radiology Ct Head Wo Contrast  Result Date: 09/30/2016 CLINICAL DATA:  Blurred vision with left-sided weakness and numbness for 3 days EXAM: CT HEAD WITHOUT CONTRAST TECHNIQUE: Contiguous axial images were obtained from the base of the skull through the vertex without intravenous contrast. COMPARISON:  None. FINDINGS: Brain: The ventricles are normal in size and configuration. There is mild invagination of CSF into the sella. There is no intracranial mass, hemorrhage, extra-axial fluid collection, or midline shift. Gray-white compartments are normal. No acute infarct evident. There are foci of  basal ganglia calcification bilaterally, a finding felt to be physiologic. Vascular: There is no hyperdense vessel. No vascular calcifications are evident. Skull: The bony calvarium appears intact. Sinuses/Orbits: Visualized paranasal sinuses are clear. There is rightward deviation of the nasal septum. There are no intraorbital lesions. Other: Mastoid air cells are clear. IMPRESSION: No intracranial mass, hemorrhage, or extra-axial fluid collection. No acute infarct evident. Mild invagination of CSF into the sella. Ventricles normal in size and configuration. Incidental note is made of rightward deviation of the nasal septum. Electronically Signed   By: Lowella Grip III M.D.   On: 09/30/2016 14:14    Procedures Procedures (including critical care time)  Medications Ordered in ED Medications - No data to display   Initial Impression / Assessment and Plan / ED Course  I have reviewed the triage vital signs and the nursing notes.  Pertinent labs & imaging results that were available during my care of the patient were reviewed by me and considered in my medical decision making (see chart for details).  Clinical Course  Value Comment By Time  Bilirubin Urine: NEGATIVE Hyperglycemia noted. No DKA.  Labs reviewed.  CT negative Dorie Rank, MD 10/08 1539   I discussed the findings with the patient. Her laboratory tests and CT scan are reassuring. I am concerned about the possibility of a stroke however considering her persistent numbness. I explained to her that the CAT scan unfortunately can't rule that out.  We do not have MRI capabilities at the free standing ED today. I discussed having the patient go to [facility] to have an MRI.   Patient does not want to have to wait longer. She would prefer to call her doctor on Monday to schedule an MRI. Patient understands to return to [facility] emergency room immediately if she has any worsening symptoms  Final Clinical Impressions(s) / ED Diagnoses    Final diagnoses:  Paresthesia  Left sided numbness  Hyperglycemia    New Prescriptions New  Prescriptions   No medications on file     Dorie Rank, MD 09/30/16 1559

## 2016-09-30 NOTE — Discharge Instructions (Signed)
Return to [facility] emergency room immediately if you have any worsening symptoms, weakness or difficulty with your speech where she change her mind about trying to get an MRI today. Follow-up with your doctor to be rechecked.  Call tomorrow to schedule an appointment.

## 2016-09-30 NOTE — ED Triage Notes (Signed)
Patient reports left side numbness which began this morning.  Reports since Thursday she has had intermittent left face numbness but this resolves.  Reports at 10am this morning she began having left arm and leg numbness.  Reports face is now constantly numb.  Gait steady but grips noted to be decreased on left side.  No facial drooping noted.

## 2016-10-22 ENCOUNTER — Ambulatory Visit (INDEPENDENT_AMBULATORY_CARE_PROVIDER_SITE_OTHER): Payer: BC Managed Care – PPO | Admitting: Pediatrics

## 2016-10-22 ENCOUNTER — Encounter: Payer: Self-pay | Admitting: Pediatrics

## 2016-10-22 VITALS — BP 134/88 | HR 84 | Temp 98.7°F | Resp 16 | Ht 68.0 in | Wt 252.6 lb

## 2016-10-22 DIAGNOSIS — T63421A Toxic effect of venom of ants, accidental (unintentional), initial encounter: Secondary | ICD-10-CM | POA: Diagnosis not present

## 2016-10-22 DIAGNOSIS — J302 Other seasonal allergic rhinitis: Secondary | ICD-10-CM

## 2016-10-22 DIAGNOSIS — Z9989 Dependence on other enabling machines and devices: Secondary | ICD-10-CM

## 2016-10-22 DIAGNOSIS — G4733 Obstructive sleep apnea (adult) (pediatric): Secondary | ICD-10-CM | POA: Diagnosis not present

## 2016-10-22 DIAGNOSIS — T7800XA Anaphylactic reaction due to unspecified food, initial encounter: Secondary | ICD-10-CM | POA: Insufficient documentation

## 2016-10-22 DIAGNOSIS — L500 Allergic urticaria: Secondary | ICD-10-CM | POA: Diagnosis not present

## 2016-10-22 DIAGNOSIS — I10 Essential (primary) hypertension: Secondary | ICD-10-CM | POA: Diagnosis not present

## 2016-10-22 DIAGNOSIS — T63441A Toxic effect of venom of bees, accidental (unintentional), initial encounter: Secondary | ICD-10-CM | POA: Diagnosis not present

## 2016-10-22 NOTE — Patient Instructions (Signed)
Environmental control of dust and mold Zyrtec 10 mg once a day if needed for runny nose or itching Do foods with salicylates make you itch? Change lisinopril to a non  ACE  inhibitor and a non-beta blocker to control your blood pressure  If you have an allergic reaction take Benadryl 50 mg every 4 hours and if you have life-threatening symptoms inject with EpiPen 0.3 mg. Then write what you had to eat or drink in the previous 4 hours  Make a list of the hair products that your  hairdresser used 2 days before your reaction  I will call you with the results of your lab work. Avoid beef, pork and lamb until you hear from me.

## 2016-10-22 NOTE — Progress Notes (Signed)
[place] [address] Dept: [id number]  New Patient Note  Patient ID: Shannon Rosario, female    DOB: November 25, 1961  Age: 55 y.o. MRN: [id number] Date of Office Visit: 10/22/2016 Referring provider: Benito Mccreedy, MD [place] [id number] [place], [place] [address]    Chief Complaint: Allergic Reaction (lip swelling, hives all over, itching x 3 weeks ago went to [place] on [address] was given Epi)  HPI [facility] presents for evaluation of allergic reaction 3 weeks ago. She went to the emergency room with this allergic reaction. The hives lasted for 4 or  5 days She had swelling of her mouth, throat face. She also had hives. In childhood she was allergic to milk, eggs and citrus but she can now  tolerate these foods. She avoids pineapple because once in the past, she had  swelling of her throat There are no clearcut precipitants to this allergic reaction. However she noted that she may have had an insect bite in 1 foot and one buttock. The possible bite of one  foot may have been swollen for 4 days She used to take allergy injections when she was a child because of severe allergic rhinitis. She has gastroesophageal reflux. She has not had asthmatic symptoms. Two  days before this reaction she went to her hairdresser. She has not noted skin reactions on her scalp. She has not started using CPAP for her obstructive sleep apnea  Review of Systems  Constitutional:       Obese  HENT:       Nasal congestion for several years. She has obstructive sleep apnea. She used to take allergy injections as a child. She is not using CPAP. She had a T&A  Eyes: Negative.   Respiratory: Negative.   Cardiovascular:       Hypertension  Gastrointestinal:       Gastroesophageal reflux. Cholecystectomy  Genitourinary: Negative.   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     Endo/Heme/Allergies:       Adult-onset diabetes for 6 years  Psychiatric/Behavioral: Negative.     Outpatient Encounter Prescriptions as of 10/22/2016  Medication Sig  . CALCIUM PO Take 1 capsule by mouth daily.  . cholecalciferol (VITAMIN D) 1000 UNITS tablet Take 1,000 Units by mouth daily.  Marland Kitchen EPINEPHrine 0.3 mg/0.3 mL IJ SOAJ injection Inject 0.3 mLs (0.3 mg total) into the muscle once as needed (anaphylaxis).  Marland Kitchen lisinopril-hydrochlorothiazide (PRINZIDE,ZESTORETIC) 10-12.5 MG tablet Take 1 tablet by mouth daily.  . metFORMIN (GLUCOPHAGE) 1000 MG tablet Take 1,000 mg by mouth 2 (two) times daily with a meal.  . omeprazole (PRILOSEC) 20 MG capsule Take 20 mg by mouth.  . simvastatin (ZOCOR) 20 MG tablet Take 20 mg by mouth daily.  Marland Kitchen triamcinolone cream (KENALOG) 0.5 %   . diphenhydrAMINE (BENADRYL) 25 MG tablet Take 1 tablet (25 mg total) by mouth every 8 (eight) hours.  . hydrOXYzine (ATARAX/VISTARIL) 25 MG tablet Take 1 tablet (25 mg total) by mouth every 6 (six) hours as needed for itching. (Patient not taking: Reported on 10/22/2016)  . [DISCONTINUED] magnesium 30 MG tablet Take 30 mg by mouth daily.  . [DISCONTINUED] Omega-3 Fatty Acids (FISH OIL) 1000 MG CAPS Take 1 capsule by mouth daily.  . [DISCONTINUED] pantoprazole (PROTONIX) 40 MG tablet Take 1 tablet (40 mg total) by mouth daily.  . [  DISCONTINUED] predniSONE (DELTASONE) 20 MG tablet Take 2 tablets (40 mg total) by mouth daily.  . [DISCONTINUED] ranitidine (ZANTAC) 150 MG capsule Take 1 capsule (150 mg total) by mouth daily.  . [DISCONTINUED] vitamin E 100 UNIT capsule Take 100 Units by mouth daily.   No facility-administered encounter medications on file as of 10/22/2016.      Drug Allergies:  Allergies  Allergen Reactions  . Dobutamine Hypertension  . Pineapple Swelling    Reports it makes her throat swell    Family History: Melda's family history includes Allergic rhinitis in her daughter, father, grandchild,  grandchild, and mother; Asthma in her mother; Bronchitis in her daughter, grandchild, grandchild, and sister; CAD in her mother; Eczema in her grandchild and sister; Food Allergy in her daughter; Lupus in her mother; Urticaria in her grandchild..  Social and environmental. She is a Radio producer. Her symptoms are not worse at work. There are no pets in the home. She is not exposed to cigarette smoking. She has never smoked cigarettes  Physical Exam: BP 134/88   Pulse 84   Temp 98.7 F (37.1 C) (Oral)   Resp 16   Ht 5\' 8"  (1.727 m)   Wt 252 lb 9.6 oz (114.6 kg)   BMI 38.41 kg/m    Physical Exam  Constitutional: She is oriented to person, place, and time. She appears well-developed and well-nourished.  Obese  HENT:  Eyes normal. Ears normal. Nose mild swelling of nasal  turbinates. Pharynx normal.  Neck: Neck supple. No thyromegaly present.  Cardiovascular:  S1 and S2 normal no murmurs  Pulmonary/Chest:  Clear to percussion and auscultation  Abdominal: Soft. There is no tenderness (no hepatosplenomegaly).  Lymphadenopathy:    She has no cervical adenopathy.  Neurological: She is alert and oriented to person, place, and time.  Skin:  Clear but dermographia noted  Psychiatric: She has a normal mood and affect. Her behavior is normal. Judgment and thought content normal.  Vitals reviewed.   Diagnostics: Allergy skin tests were positive to weeds, Cedar pollen, a common mold and cat. Skin testing to foods was negative   Assessment  Assessment and Plan: 1. Anaphylactic reaction to bee sting, accidental or unintentional, initial encounter   2. Fire ant bite, accidental or unintentional, initial encounter   3. Anaphylactic shock due to food, initial encounter   4. 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   5. Allergic urticaria   6. Obstructive sleep apnea treated with continuous positive airway pressure (CPAP)   7. Essential hypertension   7.     Adult-onset  diabetes well controlled   No orders of the defined types were placed in this encounter.   Patient Instructions  Environmental control of dust and mold Zyrtec 10 mg once a day if needed for runny nose or itching Do foods with salicylates make you itch? Change lisinopril to a non  ACE  inhibitor and a non-beta blocker to control your blood pressure  If you have an allergic reaction take Benadryl 50 mg every 4 hours and if you have life-threatening symptoms inject with EpiPen 0.3 mg. Then write what you had to eat or drink in the previous 4 hours  Make a list of the hair products that your  hairdresser used 2 days before your reaction  I will call you with the results of your lab work. Avoid beef, pork and lamb until you hear from me.   No Follow-up on file.   Thank you for the opportunity  to care for this patient.  Please do not hesitate to contact me with questions.  Penne Lash, M.D.  [facility] [address] [place], [place] [address] [phone]

## 2017-08-28 ENCOUNTER — Other Ambulatory Visit: Payer: Self-pay | Admitting: Family Medicine

## 2017-08-28 DIAGNOSIS — M545 Low back pain: Secondary | ICD-10-CM

## 2017-09-11 ENCOUNTER — Other Ambulatory Visit: Payer: Self-pay | Admitting: Family Medicine

## 2017-09-11 ENCOUNTER — Ambulatory Visit
Admission: RE | Admit: 2017-09-11 | Discharge: 2017-09-11 | Disposition: A | Discharge status: Home / Self Care | Payer: Medicaid Other | Source: Ambulatory Visit | Attending: Family Medicine | Admitting: Family Medicine

## 2017-09-11 DIAGNOSIS — M545 Low back pain: Secondary | ICD-10-CM

## 2018-04-01 ENCOUNTER — Ambulatory Visit: Payer: Medicaid Other | Attending: Internal Medicine | Admitting: Physical Therapy

## 2018-04-01 DIAGNOSIS — M62830 Muscle spasm of back: Secondary | ICD-10-CM | POA: Diagnosis present

## 2018-04-01 DIAGNOSIS — R262 Difficulty in walking, not elsewhere classified: Secondary | ICD-10-CM

## 2018-04-01 DIAGNOSIS — M5442 Lumbago with sciatica, left side: Secondary | ICD-10-CM | POA: Insufficient documentation

## 2018-04-01 DIAGNOSIS — M5441 Lumbago with sciatica, right side: Secondary | ICD-10-CM | POA: Insufficient documentation

## 2018-04-01 DIAGNOSIS — G8929 Other chronic pain: Secondary | ICD-10-CM | POA: Diagnosis present

## 2018-04-02 NOTE — Therapy (Signed)
[facility] [place] [address]  [place], [place], [address] Phone: [phone]   Fax:  [phone]  Physical Therapy Evaluation  Patient Details  Name: Shannon Rosario MRN: [id number] Date of Birth: 1961/09/13 Referring Provider: Benito Mccreedy, MD   Encounter Date: 04/01/2018  PT End of Session - 04/01/18 1553    Visit Number  1    Number of Visits  4    Date for PT Re-Evaluation  04/25/18    Authorization Type  Medicaid    PT Start Time  1450    PT Stop Time  1553    PT Time Calculation (min)  63 min    Activity Tolerance  Patient tolerated treatment well    Behavior During Therapy  [facility] for tasks assessed/performed       Past Medical History:  Diagnosis Date  . Arthritis   . Depression   . Diabetes mellitus without complication ([place])   . Family history of adverse reaction to anesthesia    My Mother had some complications,I dont remember what   . GERD (gastroesophageal reflux disease)   . Heart murmur   . Hypercholesterolemia   . Hypertension   . Obesity   . OSA (obstructive sleep apnea)   . TIA (transient ischemic attack) 2012    Past Surgical History:  Procedure Laterality Date  . APPENDECTOMY    . CARPAL TUNNEL RELEASE    . CESAREAN SECTION    . CHOLECYSTECTOMY    . KNEE SURGERY    . NASAL SINUS SURGERY    . TONSILLECTOMY      There were no vitals filed for this visit.   Subjective Assessment - 04/01/18 1509    Subjective  Pt reports h/o OA & osteopenia with collapsed disc in lower back and tailbone - causes low back pain with radicular pain in the groin area with weakness in her legs causing her kne to give way at times. Pain limits positional and sleeping tolerance.    Pertinent History  DDD, OA, osteopenia     Limitations  Sitting;Standing;Walking;House hold activities;Lifting    How long can you sit comfortably?  5-10 minutes    How long can you stand comfortably?  10-15 minutes     How long can you walk comfortably?  5 minutes w/o UE support    Patient Stated Goals  "relief of pain & better mobility"    Currently in Pain?  Yes    Pain Score  6     Pain Location  Back    Pain Orientation  Lower;Right;Left    Pain Type  Chronic pain    Pain Radiating Towards  numbness into B thighs, radicular pain further down R LE than L    Pain Onset  More than a month ago 31 yrs    Pain Frequency  Constant    Aggravating Factors   prolonged sitting, standing or walking    Pain Relieving Factors  ice pack, rest    Effect of Pain on Daily Activities  limited walking tolerance - requires need for UE support (shopping cart); limited positional tolerance; L knee prone to buckling         [facility] PT Assessment - 04/01/18 1450      Assessment   Medical Diagnosis  Acute on chronic LBP    Referring Provider  Benito Mccreedy, MD    Onset Date/Surgical Date  -- 30 yrs    Hand Dominance  Right  Next MD Visit  ~May 2019    Prior Therapy  remote h/o PT for LBP (~2009-2010); PT for R RTC injury; PT s/p L knee scope      Balance Screen   Has the patient fallen in the past 6 months  No    Has the patient had a decrease in activity level because of a fear of falling?   No    Is the patient reluctant to leave their home because of a fear of falling?   No      Home Environment   Living Environment  Private residence    Type of [place] to enter    Entrance Stairs-Number of Steps  1    Home Layout  Two level;Bed/bath upstairs    Home Equipment  Shower seat - built in      Prior Function   Level of Independence  Independent with basic ADLs;Independent with gait;Independent with transfers;Needs assistance with homemaking    Vocation  Unemployed applied for disability    Leisure  read; daily naps in afternoon - mostly sedentary      Posture/Postural Control   Posture/Postural Control  Postural limitations    Postural Limitations  Increased lumbar  lordosis;Anterior pelvic tilt      ROM / Strength   AROM / PROM / Strength  AROM;Strength      AROM   AROM Assessment Site  Lumbar    Lumbar Flexion  hands to knees - tight    Lumbar Extension  30% - pain    Lumbar - Right Side Bend  hand to lateral knee    Lumbar - Left Side Bend  hand to lateral knee    Lumbar - Right Rotation  20%    Lumbar - Left Rotation  20%      Strength   Strength Assessment Site  Hip;Knee    Right/Left Hip  Right;Left    Right Hip Flexion  4/5    Right Hip Extension  4-/5    Right Hip External Rotation   4/5    Right Hip Internal Rotation  4+/5    Right Hip ABduction  4/5    Right Hip ADduction  3+/5    Left Hip Flexion  3+/5    Left Hip Extension  3+/5    Left Hip External Rotation  3+/5    Left Hip Internal Rotation  4-/5    Left Hip ABduction  4-/5    Left Hip ADduction  3-/5    Right/Left Knee  Right;Left    Right Knee Flexion  4+/5    Right Knee Extension  4+/5    Left Knee Flexion  4-/5    Left Knee Extension  4/5      Flexibility   Soft Tissue Assessment /Muscle Length  yes    Hamstrings  mod tight L>R    Quadriceps  mild tight B    Piriformis  mod tight R>L                Objective measurements completed on examination: See above findings.      [facility] Adult PT Treatment/Exercise - 04/01/18 1450      Exercises   Exercises  Lumbar      Lumbar Exercises: Stretches   Passive Hamstring Stretch  Right;Left;30 seconds;1 rep    Passive Hamstring Stretch Limitations  seated with fwd lean    Figure 4 Stretch  30 seconds;1 rep;Seated;With overpressure  Lumbar Exercises: Supine   Pelvic Tilt  5 reps;5 seconds             PT Education - 04/01/18 1550    Education provided  Yes    Education Details  PT eval findings, anticipated POC & initial HEP    Person(s) Educated  Patient    Methods  Explanation;Demonstration;Handout    Comprehension  Verbalized understanding;Returned demonstration;Need further instruction           PT Long Term Goals - 04/01/18 1653      PT LONG TERM GOAL #1   Title  Independent with initial HEP    Status  New    Target Date  04/25/18      PT LONG TERM GOAL #2   Title  Pt will verbalize/demonstrate understanding of proper posture and body mechanics to reduce strain on low back during daily tasks    Status  New    Target Date  04/25/18      PT LONG TERM GOAL #3   Title  Pt will report >/= 25% improvement in LBP for improved standing and walking tolerance     Status  New    Target Date  04/25/18             Plan - 04/01/18 1553    Clinical Impression Statement  Shannon Rosario is a 57 y/o female who presents to OP PT with 31 yr h/o chronic low back pain. Currently also experiencing B LE radicular pain and numbness into B thighs, R > L. Pain limits positional tolerance for sitting and standing as well as walking tolerance requiring need for UE support from shopping cart to make it through grocery store. Pt notes h/o benefit from prior PT episodes for similar pain issues, with most recent episode ~2009-2010. Lumbar ROM limited in all planes with pt noting tightness in most directions and pain into extension. Proximal LE flexibility demonstrating mild to moderate restriction, with mild to moderate proximal LE weakness also evident. Pt will benefit from skilled PT to address deficits listed and allow return to normal activity level w/o pain interference. Initial POC will be for 3 visits per [facility] authorization limits, but will consider recert for additional visits pending progress with PT.    History and Personal Factors relevant to plan of care:  morbid obesity, OA, osteopenia, DM + PMH    Clinical Presentation  Stable    Clinical Decision Making  Low    Rehab Potential  Good    Clinical Impairments Affecting Rehab Potential  morbid obesity    PT Frequency  1x / week    PT Duration  3 weeks    PT Treatment/Interventions  Patient/family education;ADLs/Self Care Home  Management;Neuromuscular re-education;Therapeutic exercise;Therapeutic activities;Functional mobility training;Gait training;Stair training;Manual techniques;Taping;Dry needling;Electrical Stimulation;Moist Heat;Cryotherapy;Traction    Consulted and Agree with Plan of Care  Patient       Patient will benefit from skilled therapeutic intervention in order to improve the following deficits and impairments:  Pain, Impaired flexibility, Increased muscle spasms, Postural dysfunction, Improper body mechanics, Decreased range of motion, Decreased strength, Difficulty walking, Decreased activity tolerance  Visit Diagnosis: Chronic bilateral low back pain with bilateral sciatica  Difficulty in walking, not elsewhere classified  Muscle spasm of back     Problem List Patient Active Problem List   Diagnosis Date Noted  . 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 Fire ant bite 10/22/2016  . 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 10/22/2016  . Obstructive sleep apnea treated with continuous positive airway pressure (CPAP) 10/22/2016  . Chest pain 09/21/2015  . Essential hypertension 09/21/2015  . Diabetes ([place]) 09/21/2015  . Pain in the chest     Percival Spanish, PT, MPT 04/02/2018, 8:23 AM  [facility] [address]  [place], [place], [address] Phone: [phone]   Fax:  [phone]  Name: Casie Sturgeon MRN: [id number] Date of Birth: 09-19-1961

## 2018-04-08 ENCOUNTER — Ambulatory Visit: Payer: Medicaid Other

## 2018-04-15 ENCOUNTER — Ambulatory Visit: Payer: Medicaid Other | Admitting: Physical Therapy

## 2018-04-22 ENCOUNTER — Ambulatory Visit: Payer: Medicaid Other | Admitting: Physical Therapy

## 2018-04-22 ENCOUNTER — Encounter: Payer: Self-pay | Admitting: Physical Therapy

## 2018-04-22 DIAGNOSIS — M5442 Lumbago with sciatica, left side: Principal | ICD-10-CM

## 2018-04-22 DIAGNOSIS — M5441 Lumbago with sciatica, right side: Principal | ICD-10-CM

## 2018-04-22 DIAGNOSIS — M62830 Muscle spasm of back: Secondary | ICD-10-CM

## 2018-04-22 DIAGNOSIS — G8929 Other chronic pain: Secondary | ICD-10-CM

## 2018-04-22 DIAGNOSIS — R262 Difficulty in walking, not elsewhere classified: Secondary | ICD-10-CM

## 2018-04-22 NOTE — Patient Instructions (Addendum)

## 2018-04-22 NOTE — Therapy (Signed)
[facility] [place] [address]  [place], [place], [address] Phone: [phone]   Fax:  [phone]  Physical Therapy Treatment  Patient Details  Name: Shannon Rosario MRN: [id number] Date of Birth: 06-17-1961 Referring Provider: Benito Mccreedy, MD   Encounter Date: 04/22/2018  PT End of Session - 04/22/18 1418    Visit Number  2    Number of Visits  4    Date for PT Re-Evaluation  04/28/18    Authorization Type  Medicaid    Authorization Time Period  04/08/18 - 04/28/18    Authorization - Visit Number  1    Authorization - Number of Visits  3    PT Start Time  4401 pt arrived late    PT Stop Time  1509    PT Time Calculation (min)  54 min    Activity Tolerance  Patient tolerated treatment well    Behavior During Therapy  [facility] for tasks assessed/performed       Past Medical History:  Diagnosis Date  . Arthritis   . Depression   . Diabetes mellitus without complication ([place])   . Family history of adverse reaction to anesthesia    My Mother had some complications,I dont remember what   . GERD (gastroesophageal reflux disease)   . Heart murmur   . Hypercholesterolemia   . Hypertension   . Obesity   . OSA (obstructive sleep apnea)   . TIA (transient ischemic attack) 2012    Past Surgical History:  Procedure Laterality Date  . APPENDECTOMY    . CARPAL TUNNEL RELEASE    . CESAREAN SECTION    . CHOLECYSTECTOMY    . KNEE SURGERY    . NASAL SINUS SURGERY    . TONSILLECTOMY      There were no vitals filed for this visit.  Subjective Assessment - 04/22/18 1416    Subjective  Pt reporting some issues with HEP - difficulty with pelvic tilt in particular. Notes one epsiode where her L knee buckled recently on the stairs, but was able to catch herself.    Pertinent History  DDD, OA, osteopenia     Patient Stated Goals  "relief of pain & better mobility"    Currently in Pain?  No/denies                        [facility] Adult PT Treatment/Exercise - 04/22/18 1415      Self-Care   Self-Care  Posture    Posture  Provided education in proper posture & body mechanics with typical daily activities and household chores.      Exercises   Exercises  Lumbar      Lumbar Exercises: Stretches   Passive Hamstring Stretch  Right;Left;30 seconds;2 reps    Passive Hamstring Stretch Limitations  seated with fwd lean    Figure 4 Stretch  30 seconds;2 reps;Seated;With overpressure      Lumbar Exercises: Aerobic   Nustep  L3 x 6' (LE only)      Lumbar Exercises: Supine   Pelvic Tilt  10 reps;5 seconds    Bent Knee Raise  10 reps;3 seconds    Bent Knee Raise Limitations  brace marching    Bridge  10 reps;5 seconds    Bridge Limitations  cues for pacing, avoiding jerky motions             PT Education - 04/22/18 1509    Education provided  Yes    Education Details  Posture & body mechanics education for typically daily tasks; HEP update - lumbopelvic strengthening    Person(s) Educated  Patient    Methods  Explanation;Demonstration;Handout    Comprehension  Verbalized understanding;Returned demonstration;Need further instruction          PT Long Term Goals - 04/22/18 1419      PT LONG TERM GOAL #1   Title  Independent with initial HEP    Status  Achieved      PT LONG TERM GOAL #2   Title  Pt will verbalize/demonstrate understanding of proper posture and body mechanics to reduce strain on low back during daily tasks    Status  On-going      PT LONG TERM GOAL #3   Title  Pt will report >/= 25% improvement in LBP for improved standing and walking tolerance     Status  On-going            Plan - 04/22/18 1419    Clinical Impression Statement  Shannon Rosario returning for first time since eval 3 weeks ago with 2 missed appts in the prior 2 weeks. Pt reporting good compliance with HEP but questions if she was performing pelvic tilt correctly - review of HEP  reqiuring only minor clarification of technique. Pt noting back pain more intermittent with completion of HEP, but pain intensity essentially unchanged. Progressed therapeutic exercises with basic lumbopelvic strengthening, but pt noting some increased pubic pain with isometric hip adduction and some increased sacral pain with hip abduction with theraband. GIven pain patterns, assess SIJ alignment, but no misalignment identified. Pt requesting HEP update with exercises completed today, but cautioned pt to avoid exercises if pain worsens during completion. Treatment concluded with instruction in posture and body mechanics education for typical daily tasks, with pt reporting she typically avoids many household chores due to back and knee issues.    Rehab Potential  Good    Clinical Impairments Affecting Rehab Potential  morbid obesity    PT Treatment/Interventions  Patient/family education;ADLs/Self Care Home Management;Neuromuscular re-education;Therapeutic exercise;Therapeutic activities;Functional mobility training;Gait training;Stair training;Manual techniques;Taping;Dry needling;Electrical Stimulation;Moist Heat;Cryotherapy;Traction    Consulted and Agree with Plan of Care  Patient       Patient will benefit from skilled therapeutic intervention in order to improve the following deficits and impairments:  Pain, Impaired flexibility, Increased muscle spasms, Postural dysfunction, Improper body mechanics, Decreased range of motion, Decreased strength, Difficulty walking, Decreased activity tolerance  Visit Diagnosis: Chronic bilateral low back pain with bilateral sciatica  Difficulty in walking, not elsewhere classified  Muscle spasm of back     Problem List Patient Active Problem List   Diagnosis Date Noted  . 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 Fire ant bite 10/22/2016  . 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 Chronic  seasonal 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 10/22/2016  . Obstructive sleep apnea treated with continuous positive airway pressure (CPAP) 10/22/2016  . Chest pain 09/21/2015  . Essential hypertension 09/21/2015  . Diabetes ([place]) 09/21/2015  . Pain in the chest     Percival Spanish, PT, MPT 04/22/2018, 3:27 PM  [facility] [address]  [place], [place], [address] Phone: [phone]   Fax:  [phone]  Name: Shannon Rosario MRN: [id number] Date of Birth: 1961/01/10

## 2018-04-28 ENCOUNTER — Ambulatory Visit: Payer: Medicaid Other | Attending: Internal Medicine

## 2018-04-28 DIAGNOSIS — G8929 Other chronic pain: Secondary | ICD-10-CM | POA: Insufficient documentation

## 2018-04-28 DIAGNOSIS — R262 Difficulty in walking, not elsewhere classified: Secondary | ICD-10-CM

## 2018-04-28 DIAGNOSIS — M5441 Lumbago with sciatica, right side: Secondary | ICD-10-CM | POA: Diagnosis present

## 2018-04-28 DIAGNOSIS — M5442 Lumbago with sciatica, left side: Secondary | ICD-10-CM | POA: Diagnosis present

## 2018-04-28 DIAGNOSIS — M62830 Muscle spasm of back: Secondary | ICD-10-CM | POA: Insufficient documentation

## 2018-04-28 NOTE — Therapy (Signed)
[facility] [address]  [place], [place], [address] Phone: [phone]   Fax:  [phone]  Physical Therapy Treatment  Patient Details  Name: Sanaai Doane MRN: [id number] Date of Birth: 08/10/1961 Referring Provider: Benito Mccreedy, MD   Encounter Date: 04/28/2018  PT End of Session - 04/28/18 1459    Visit Number  3    Number of Visits  15    Date for PT Re-Evaluation  06/16/18    Authorization Type  Medicaid    Authorization Time Period  04/08/18 - 04/28/18    Authorization - Visit Number  2    Authorization - Number of Visits  3    PT Start Time  9379    PT Stop Time  1541 10 min moist heat to end treatment     PT Time Calculation (min)  48 min    Activity Tolerance  Patient tolerated treatment well    Behavior During Therapy  [facility] for tasks assessed/performed       Past Medical History:  Diagnosis Date  . Arthritis   . Depression   . Diabetes mellitus without complication ([place])   . Family history of adverse reaction to anesthesia    My Mother had some complications,I dont remember what   . GERD (gastroesophageal reflux disease)   . Heart murmur   . Hypercholesterolemia   . Hypertension   . Obesity   . OSA (obstructive sleep apnea)   . TIA (transient ischemic attack) 2012    Past Surgical History:  Procedure Laterality Date  . APPENDECTOMY    . CARPAL TUNNEL RELEASE    . CESAREAN SECTION    . CHOLECYSTECTOMY    . KNEE SURGERY    . NASAL SINUS SURGERY    . TONSILLECTOMY      There were no vitals filed for this visit.  Subjective Assessment - 04/28/18 1456    Subjective  Pt. reporting difficulty lying on back with HEP and has partially completed HEP.      Pertinent History  DDD, OA, osteopenia     Patient Stated Goals  "relief of pain & better mobility"    Currently in Pain?  Yes    Pain Score  6     Pain Location  Back    Pain Orientation  Lower;Right    Pain Descriptors /  Indicators  -- "pressure"    Pain Type  Chronic pain    Pain Radiating Towards  Numbness into B lateral anterior thighs    Pain Onset  More than a month ago    Pain Frequency  Constant    Aggravating Factors   prolonged standing     Pain Relieving Factors  ice pack     Multiple Pain Sites  No         [facility] PT Assessment - 04/28/18 1453      Assessment   Medical Diagnosis  Acute on chronic LBP    Referring Provider  Benito Mccreedy, MD    Onset Date/Surgical Date  -- 30 yrs    Next MD Visit  ~May 2019      Prior Function   Level of Independence  Independent with basic ADLs;Independent with gait;Independent with transfers;Needs assistance with homemaking    Vocation  Unemployed applied for disability    Leisure  read; daily naps in afternoon - mostly sedentary  [facility] Adult PT Treatment/Exercise - 04/28/18 1509      Lumbar Exercises: Stretches   Piriformis Stretch  Right;2 reps;30 seconds    Piriformis Stretch Limitations  supine     Figure 4 Stretch  30 seconds;2 reps;Seated;With overpressure    Figure 4 Stretch Limitations  seated       Lumbar Exercises: Aerobic   Recumbent Bike  Lvl 1, 6 min       Lumbar Exercises: Supine   Clam  10 reps;3 seconds    Clam Limitations  alteranting yellow TB at knees     Bent Knee Raise  15 reps;3 seconds    Bent Knee Raise Limitations  yellow TB at knees  Cues for abdom. bracing     Bridge  5 seconds x 12 reps     Bridge Limitations  cues for pacing, avoiding jerky motions    Bridge with [employer]  10 reps;3 seconds    Bridge with [employer] Limitations  adduction ball squeeze       Lumbar Exercises: Sidelying   Clam  Right;15 reps      Modalities   Modalities  Moist Heat      Moist Heat Therapy   Number Minutes Moist Heat  10 Minutes    Moist Heat Location  Lumbar Spine      Manual Therapy   Manual Therapy  Soft tissue mobilization    Manual therapy comments  sidelying     Soft tissue  mobilization  STM to R buttocks, R-sided paraspinals; some tenderness in paraspinals, ttp in R buttocks                   PT Long Term Goals - 04/28/18 1515      PT LONG TERM GOAL #1   Title  Independent with initial HEP    Status  Achieved      PT LONG TERM GOAL #2   Title  Pt will verbalize/demonstrate understanding of proper posture and body mechanics to reduce strain on low back during daily tasks    Status  Achieved      PT LONG TERM GOAL #3   Title  Pt will report >/= 25% improvement in LBP for improved standing and walking tolerance     Status  On-going no change on pain levels     [place] Date  06/16/18      PT LONG TERM GOAL #4   Title  Pt will demostrate increased in B hip strength to 4-/5 to 4/5 for improved proximal stability    Status  New    [place] Date  06/16/18      PT LONG TERM GOAL #5   Title  Pt will report ability to walk for 15-20 minutes w/o reliance on UE/UB support on grocery cart w/o increased LBP to improve tolerance for grocery shopping    Status  New    Target Date  06/16/18      PT LONG TERM GOAL #6   Title  Independent with ongoing HEP    Status  New    Target Date  06/16/18            Plan - 04/28/18 1500    Clinical Impression Statement  Pt. reporting some increased LBP/"soreness" following HEP performance this past week however, feels her "mobility" is improving since starting therapy.  Wishes to continue with therapy and notes she'd like additional focus on improved tolerance for walking in grocery store without need for leaning  on cart.  Pt. still noting difficulty climbing stairs and washing dishes however has had some improved tolerance for washing dishes after alternating body mechanics.  Reports no significant change in LBP levels overall at this point since starting therapy.  Good tolerance for all supine level lumbopelvic strengthening activities in treatment today only requiring minor cueing for proper technique.  Recommend  recert for additional 12 visits to further address pain and strength deficits to improve activity tolerance.    Rehab Potential  Good    Clinical Impairments Affecting Rehab Potential  morbid obesity    PT Frequency  2x / week    PT Duration  6 weeks    PT Treatment/Interventions  Patient/family education;ADLs/Self Care Home Management;Neuromuscular re-education;Therapeutic exercise;Therapeutic activities;Functional mobility training;Gait training;Stair training;Manual techniques;Taping;Dry needling;Electrical Stimulation;Moist Heat;Cryotherapy;Traction    Consulted and Agree with Plan of Care  Patient       Patient will benefit from skilled therapeutic intervention in order to improve the following deficits and impairments:  Pain, Impaired flexibility, Increased muscle spasms, Postural dysfunction, Improper body mechanics, Decreased range of motion, Decreased strength, Difficulty walking, Decreased activity tolerance  Visit Diagnosis: Chronic bilateral low back pain with bilateral sciatica - Plan: PT plan of care cert/re-cert  Difficulty in walking, not elsewhere classified - Plan: PT plan of care cert/re-cert  Muscle spasm of back - Plan: PT plan of care cert/re-cert     Problem List Patient Active Problem List   Diagnosis Date Noted  . 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 Fire ant bite 10/22/2016  . 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 10/22/2016  . Obstructive sleep apnea treated with continuous positive airway pressure (CPAP) 10/22/2016  . Chest pain 09/21/2015  . Essential hypertension 09/21/2015  . Diabetes ([place]) 09/21/2015  . Pain in the chest     Bess Harvest, PTA 04/28/18 6:45 PM  [facility] [address]  [place], [place], [address] Phone: [phone]   Fax:  [phone]  Name:  Magally Vahle MRN: [id number] Date of Birth: [birth year]

## 2018-05-07 ENCOUNTER — Ambulatory Visit: Payer: Medicaid Other

## 2018-05-07 DIAGNOSIS — M5442 Lumbago with sciatica, left side: Secondary | ICD-10-CM | POA: Diagnosis not present

## 2018-05-07 DIAGNOSIS — R262 Difficulty in walking, not elsewhere classified: Secondary | ICD-10-CM

## 2018-05-07 DIAGNOSIS — M5441 Lumbago with sciatica, right side: Principal | ICD-10-CM

## 2018-05-07 DIAGNOSIS — G8929 Other chronic pain: Secondary | ICD-10-CM

## 2018-05-07 DIAGNOSIS — M62830 Muscle spasm of back: Secondary | ICD-10-CM

## 2018-05-07 NOTE — Therapy (Signed)
[facility] [address]  [place], [place], [address] Phone: [phone]   Fax:  [phone]  Physical Therapy Treatment  Patient Details  Name: Shannon Rosario MRN: [id number] Date of Birth: 08-Nov-1961 Referring Provider: Benito Mccreedy, MD   Encounter Date: 05/07/2018  PT End of Session - 05/07/18 1108    Visit Number  4    Number of Visits  15    Date for PT Re-Evaluation  06/16/18    Authorization Type  Medicaid    Authorization Time Period  5.13.19 - 6.23.19    Authorization - Visit Number  1    Authorization - Number of Visits  12    PT Start Time  1102    PT Stop Time  1145    PT Time Calculation (min)  43 min    Activity Tolerance  Patient tolerated treatment well    Behavior During Therapy  [facility] for tasks assessed/performed       Past Medical History:  Diagnosis Date  . Arthritis   . Depression   . Diabetes mellitus without complication ([place])   . Family history of adverse reaction to anesthesia    My Mother had some complications,I dont remember what   . GERD (gastroesophageal reflux disease)   . Heart murmur   . Hypercholesterolemia   . Hypertension   . Obesity   . OSA (obstructive sleep apnea)   . TIA (transient ischemic attack) 2012    Past Surgical History:  Procedure Laterality Date  . APPENDECTOMY    . CARPAL TUNNEL RELEASE    . CESAREAN SECTION    . CHOLECYSTECTOMY    . KNEE SURGERY    . NASAL SINUS SURGERY    . TONSILLECTOMY      There were no vitals filed for this visit.  Subjective Assessment - 05/07/18 1107    Subjective  Pt. noting some relief for remainder of day following last visit which she attributes to the heat applied to buttocks.      Pertinent History  DDD, OA, osteopenia     How long can you walk comfortably?  5 minutes w/o UE support    Patient Stated Goals  "relief of pain & better mobility"    Currently in Pain?  Yes    Pain Score  7     Pain  Location  Back    Pain Orientation  Lower;Right    Pain Descriptors / Indicators  Tightness    Pain Type  Chronic pain    Pain Radiating Towards  none today     Pain Onset  More than a month ago    Pain Frequency  Constant    Aggravating Factors   crossing legs, "sitting up too straight"    Pain Relieving Factors  Ice pack     Multiple Pain Sites  No                       [facility] Adult PT Treatment/Exercise - 05/07/18 1120      Self-Care   Self-Care  Other Self-Care Comments    Other Self-Care Comments   Discussed positioning with LE resting on open cabinet base with prolonged kitchen work as to reduce lumbar strain in prolonged standing positions        Lumbar Exercises: Aerobic   Nustep  L4 x 7' (LE only)      Lumbar Exercises: Standing   Functional Squats  10 reps;3  seconds    Functional Squats Limitations  counter; Cueing to activate glutes by "cracking the walnut"      Lumbar Exercises: Seated   Sit to Stand  10 reps without UE support       Lumbar Exercises: Supine   Bridge with clamshell  15 reps;3 seconds isometric hip abd/ER into yellow TB at knees     Other Supine Lumbar Exercises  Hooklying adduction ball squeeze 5" x 10 reps       Lumbar Exercises: Sidelying   Clam  Right;10 reps;3 seconds    Clam Limitations  yellow TB at knees       Moist Heat Therapy   Number Minutes Moist Heat  10 Minutes    Moist Heat Location  Lumbar Spine R buttocks       Manual Therapy   Manual Therapy  Soft tissue mobilization;Myofascial release    Manual therapy comments  sidelying     Soft tissue mobilization  STM to R buttocks/piriformis, R-sided paraspinals; some tenderness in paraspinals, ttp in R buttocks     Myofascial Release  TPR to R medial glute area in area of most tenderness                  PT Long Term Goals - 05/07/18 1130      PT LONG TERM GOAL #1   Title  Independent with initial HEP    Status  Achieved      PT LONG TERM GOAL #2    Title  Pt will verbalize/demonstrate understanding of proper posture and body mechanics to reduce strain on low back during daily tasks    Status  Achieved      PT LONG TERM GOAL #3   Title  Pt will report >/= 25% improvement in LBP for improved standing and walking tolerance     Status  On-going no change on pain levels       PT LONG TERM GOAL #4   Title  Pt will demostrate increased in B hip strength to 4-/5 to 4/5 for improved proximal stability    Status  On-going      PT LONG TERM GOAL #5   Title  Pt will report ability to walk for 15-20 minutes w/o reliance on UE/UB support on grocery cart w/o increased LBP to improve tolerance for grocery shopping    Status  On-going      PT LONG TERM GOAL #6   Title  Independent with ongoing HEP    Status  On-going            Plan - 05/07/18 1133    Clinical Impression Statement  Shannon Rosario returning to therapy today reporting pain levels have not changed significantly since last visit.  Did have noted tenderness and tone in R medial glute and R-sided paraspinals and these areas were addressed with STM with some relief noted following this.  Lumbopelvic strengthening progressed today with pt. tolerating well focused on adductor and glute strengthening activities.  Pt. noting good benefit from moist heat applied last visit thus ended treatment with heat to lumbar spine/R glute for hopeful reduction in post-exercise pain/tone.  Will continue to progress toward goals.      PT Treatment/Interventions  Patient/family education;ADLs/Self Care Home Management;Neuromuscular re-education;Therapeutic exercise;Therapeutic activities;Functional mobility training;Gait training;Stair training;Manual techniques;Taping;Dry needling;Electrical Stimulation;Moist Heat;Cryotherapy;Traction    Consulted and Agree with Plan of Care  Patient       Patient will benefit from skilled therapeutic intervention in order to improve  the following deficits and impairments:   Pain, Impaired flexibility, Increased muscle spasms, Postural dysfunction, Improper body mechanics, Decreased range of motion, Decreased strength, Difficulty walking, Decreased activity tolerance  Visit Diagnosis: Chronic bilateral low back pain with bilateral sciatica  Difficulty in walking, not elsewhere classified  Muscle spasm of back     Problem List Patient Active Problem List   Diagnosis Date Noted  . 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 Fire ant bite 10/22/2016  . 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 10/22/2016  . Obstructive sleep apnea treated with continuous positive airway pressure (CPAP) 10/22/2016  . Chest pain 09/21/2015  . Essential hypertension 09/21/2015  . Diabetes ([place]) 09/21/2015  . Pain in the chest     Bess Harvest, PTA 05/07/18 12:10 PM  [facility] [address]  [place], [place], [address] Phone: [phone]   Fax:  [phone]  Name: Shannon Rosario MRN: [id number] Date of Birth: 06-11-1961

## 2018-05-12 ENCOUNTER — Ambulatory Visit: Payer: Medicaid Other | Admitting: Physical Therapy

## 2018-05-14 ENCOUNTER — Ambulatory Visit: Payer: Medicaid Other

## 2018-05-14 DIAGNOSIS — M5441 Lumbago with sciatica, right side: Principal | ICD-10-CM

## 2018-05-14 DIAGNOSIS — G8929 Other chronic pain: Secondary | ICD-10-CM

## 2018-05-14 DIAGNOSIS — M5442 Lumbago with sciatica, left side: Principal | ICD-10-CM

## 2018-05-14 DIAGNOSIS — M62830 Muscle spasm of back: Secondary | ICD-10-CM

## 2018-05-14 DIAGNOSIS — R262 Difficulty in walking, not elsewhere classified: Secondary | ICD-10-CM

## 2018-05-14 NOTE — Therapy (Signed)
[facility] [address]  [place], [place], [address] Phone: [phone]   Fax:  [phone]  Physical Therapy Treatment  Patient Details  Name: Shannon Rosario MRN: [id number] Date of Birth: June 11, 1961 Referring Provider: Benito Mccreedy, MD   Encounter Date: 05/14/2018  PT End of Session - 05/14/18 1022    Visit Number  5    Number of Visits  15    Date for PT Re-Evaluation  06/16/18    Authorization Type  Medicaid    Authorization Time Period  5.13.19 - 6.23.19    Authorization - Visit Number  2    Authorization - Number of Visits  12    PT Start Time  1016    PT Stop Time  1110 ended with 10 min moist heat    PT Time Calculation (min)  54 min    Activity Tolerance  Patient tolerated treatment well    Behavior During Therapy  [facility] for tasks assessed/performed       Past Medical History:  Diagnosis Date  . Arthritis   . Depression   . Diabetes mellitus without complication ([place])   . Family history of adverse reaction to anesthesia    My Mother had some complications,I dont remember what   . GERD (gastroesophageal reflux disease)   . Heart murmur   . Hypercholesterolemia   . Hypertension   . Obesity   . OSA (obstructive sleep apnea)   . TIA (transient ischemic attack) 2012    Past Surgical History:  Procedure Laterality Date  . APPENDECTOMY    . CARPAL TUNNEL RELEASE    . CESAREAN SECTION    . CHOLECYSTECTOMY    . KNEE SURGERY    . NASAL SINUS SURGERY    . TONSILLECTOMY      There were no vitals filed for this visit.  Subjective Assessment - 05/14/18 1022    Subjective  Notes she has L ankle pain today however admits to cleaning out pantry for 2 hours yesterday.      Pertinent History  DDD, OA, osteopenia     Patient Stated Goals  "relief of pain & better mobility"    Currently in Pain?  Yes    Pain Score  6     Pain Location  Back    Pain Orientation  Lower;Right    Pain  Descriptors / Indicators  Sore    Pain Type  Chronic pain    Pain Radiating Towards  none today     Pain Onset  More than a month ago    Pain Frequency  Constant    Multiple Pain Sites  Yes    Pain Score  10    Pain Location  Ankle    Pain Orientation  Left    Pain Descriptors / Indicators  Sharp    Pain Type  Acute pain    Aggravating Factors   only while walking                        [facility] Adult PT Treatment/Exercise - 05/14/18 1029      Lumbar Exercises: Stretches   Lumbar Stabilization Level 1  3 reps;10 seconds seated red p-ball roll-outs       Lumbar Exercises: Aerobic   Nustep  L4 x 7' (LE only)      Lumbar Exercises: Seated   Sit to Stand  -- x 12 reps without UE support  Lumbar Exercises: Supine   Ab Set  10 reps;5 seconds    AB Set Limitations  + adduction ball squeeze    Bridge with Ball Squeeze  15 reps;3 seconds    Bridge with [employer] Limitations  adduction ball squeeze     Isometric Hip Flexion  10 reps;5 seconds      Knee/Hip Exercises: Standing   Hip Abduction  Right;Left;10 reps;Knee straight    Abduction Limitations  red looped TB at chair     Hip Extension  Right;Left;10 reps;Knee straight    Extension Limitations  red looped TB at chair     Forward Step Up  Right;Left;Step Height: 6";Hand Hold: 0;10 reps    Forward Step Up Limitations  Cues to step back under control       Moist Heat Therapy   Number Minutes Moist Heat  10 Minutes    Moist Heat Location  Lumbar Spine R buttocks                   PT Long Term Goals - 05/07/18 1130      PT LONG TERM GOAL #1   Title  Independent with initial HEP    Status  Achieved      PT LONG TERM GOAL #2   Title  Pt will verbalize/demonstrate understanding of proper posture and body mechanics to reduce strain on low back during daily tasks    Status  Achieved      PT LONG TERM GOAL #3   Title  Pt will report >/= 25% improvement in LBP for improved standing and walking  tolerance     Status  On-going no change on pain levels       PT LONG TERM GOAL #4   Title  Pt will demostrate increased in B hip strength to 4-/5 to 4/5 for improved proximal stability    Status  On-going      PT LONG TERM GOAL #5   Title  Pt will report ability to walk for 15-20 minutes w/o reliance on UE/UB support on grocery cart w/o increased LBP to improve tolerance for grocery shopping    Status  On-going      PT LONG TERM GOAL #6   Title  Independent with ongoing HEP    Status  On-going            Plan - 05/14/18 1022    Clinical Impression Statement  Pt. noting she had three days of muscular soreness in B anterior thighs after last visit.  Also with complaint of ankle pain today with walking however was unable to reproduce ankle pain in session today.  Pt. tolerable to advancement of lumbopelvic strengthening activities in standing today.  Admits to partial adherence to HEP.  Does require cueing throughout therex for proper abdominal bracing and for consistent breathing pattern.  Will continue to benefit from further skilled therapy to improve proximal hip strength, and reduce pain with functional tasks.      PT Treatment/Interventions  Patient/family education;ADLs/Self Care Home Management;Neuromuscular re-education;Therapeutic exercise;Therapeutic activities;Functional mobility training;Gait training;Stair training;Manual techniques;Taping;Dry needling;Electrical Stimulation;Moist Heat;Cryotherapy;Traction    Consulted and Agree with Plan of Care  Patient       Patient will benefit from skilled therapeutic intervention in order to improve the following deficits and impairments:  Pain, Impaired flexibility, Increased muscle spasms, Postural dysfunction, Improper body mechanics, Decreased range of motion, Decreased strength, Difficulty walking, Decreased activity tolerance  Visit Diagnosis: Chronic bilateral low back pain with bilateral  sciatica  Difficulty in walking, not  elsewhere classified  Muscle spasm of back     Problem List Patient Active Problem List   Diagnosis Date Noted  . 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 Fire ant bite 10/22/2016  . 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 10/22/2016  . Obstructive sleep apnea treated with continuous positive airway pressure (CPAP) 10/22/2016  . Chest pain 09/21/2015  . Essential hypertension 09/21/2015  . Diabetes ([place]) 09/21/2015  . Pain in the chest     Bess Harvest, PTA 05/14/18 12:50 PM  [facility] [address]  [place], [place], [address] Phone: [phone]   Fax:  [phone]  Name: Shannon Rosario MRN: [id number] Date of Birth: Sep 29, 1961

## 2018-05-21 ENCOUNTER — Ambulatory Visit: Payer: Medicaid Other

## 2018-05-21 DIAGNOSIS — R262 Difficulty in walking, not elsewhere classified: Secondary | ICD-10-CM

## 2018-05-21 DIAGNOSIS — M62830 Muscle spasm of back: Secondary | ICD-10-CM

## 2018-05-21 DIAGNOSIS — G8929 Other chronic pain: Secondary | ICD-10-CM

## 2018-05-21 DIAGNOSIS — M5442 Lumbago with sciatica, left side: Principal | ICD-10-CM

## 2018-05-21 DIAGNOSIS — M5441 Lumbago with sciatica, right side: Principal | ICD-10-CM

## 2018-05-21 NOTE — Therapy (Signed)
[facility] [address]  [place], [place], [address] Phone: [phone]   Fax:  [phone]  Physical Therapy Treatment  Patient Details  Name: Shannon Rosario MRN: [id number] Date of Birth: 03-18-1961 Referring Provider: Benito Mccreedy, MD   Encounter Date: 05/21/2018  PT End of Session - 05/21/18 1032    Visit Number  6    Number of Visits  15    Date for PT Re-Evaluation  06/16/18    Authorization Type  Medicaid    Authorization Time Period  5.13.19 - 6.23.19    Authorization - Visit Number  3    Authorization - Number of Visits  12    PT Start Time  1027 pt. arrived late     PT Stop Time  1100    PT Time Calculation (min)  33 min    Activity Tolerance  Patient tolerated treatment well    Behavior During Therapy  [facility] for tasks assessed/performed       Past Medical History:  Diagnosis Date  . Arthritis   . Depression   . Diabetes mellitus without complication ([place])   . Family history of adverse reaction to anesthesia    My Mother had some complications,I dont remember what   . GERD (gastroesophageal reflux disease)   . Heart murmur   . Hypercholesterolemia   . Hypertension   . Obesity   . OSA (obstructive sleep apnea)   . TIA (transient ischemic attack) 2012    Past Surgical History:  Procedure Laterality Date  . APPENDECTOMY    . CARPAL TUNNEL RELEASE    . CESAREAN SECTION    . CHOLECYSTECTOMY    . KNEE SURGERY    . NASAL SINUS SURGERY    . TONSILLECTOMY      There were no vitals filed for this visit.  Subjective Assessment - 05/21/18 1029    Subjective  Pt. noting minimal reduction in overall pain levels overall.  Does report she is performing HEP daily.      Pertinent History  DDD, OA, osteopenia     Patient Stated Goals  "relief of pain & better mobility"    Currently in Pain?  Yes    Pain Score  6     Pain Location  Back    Pain Orientation  Lower;Right    Pain  Descriptors / Indicators  Sore    Pain Type  Chronic pain    Pain Radiating Towards  "soreness" radiating into R lateral thigh     Pain Onset  More than a month ago    Pain Frequency  Constant    Multiple Pain Sites  Yes    Pain Score  0 Average 7/10 with standing/walking, 10/10 sharp pain at worst     Pain Location  Ankle    Pain Orientation  Left    Pain Descriptors / Indicators  Sharp    Pain Type  Acute pain    Pain Frequency  Intermittent    Aggravating Factors   only while walking                        [facility] Adult PT Treatment/Exercise - 05/21/18 1041      Lumbar Exercises: Stretches   Piriformis Stretch  Right;2 reps;30 seconds Towel assistance     Piriformis Stretch Limitations  supine       Lumbar Exercises: Aerobic   Nustep  L5 x 7' (LE  only)      Lumbar Exercises: Standing   Wall Slides  10 reps;3 seconds    Wall Slides Limitations  depth to tolerance  Provided cueing to promote neutral spine; well tolerated       Lumbar Exercises: Supine   Bridge  15 reps;3 seconds    Bridge Limitations  Cues to avoid jerky movements       Knee/Hip Exercises: Standing   Hip Flexion  Right;Left;10 reps;Knee straight    Hip Flexion Limitations  red looped TB at ankles; counter Cues provided to ensure abdom. bracing     Hip Abduction  Right;Left;Knee straight x 12 reps; Cues provided to ensure abdom. bracing     Abduction Limitations  red looped TB at counter    Hip Extension  Right;Left;Knee straight x 12 reps; Cues provided to ensure abdom. bracing     Extension Limitations  red looped TB at counter                  PT Long Term Goals - 05/07/18 1130      PT LONG TERM GOAL #1   Title  Independent with initial HEP    Status  Achieved      PT LONG TERM GOAL #2   Title  Pt will verbalize/demonstrate understanding of proper posture and body mechanics to reduce strain on low back during daily tasks    Status  Achieved      PT LONG TERM GOAL #3    Title  Pt will report >/= 25% improvement in LBP for improved standing and walking tolerance     Status  On-going no change on pain levels       PT LONG TERM GOAL #4   Title  Pt will demostrate increased in B hip strength to 4-/5 to 4/5 for improved proximal stability    Status  On-going      PT LONG TERM GOAL #5   Title  Pt will report ability to walk for 15-20 minutes w/o reliance on UE/UB support on grocery cart w/o increased LBP to improve tolerance for grocery shopping    Status  On-going      PT LONG TERM GOAL #6   Title  Independent with ongoing HEP    Status  On-going            Plan - 05/21/18 1034    Clinical Impression Statement  Shannon Rosario reporting that she has not seen noticeable change in LBP levels since starting therapy.  Does reporting daily adherence to HEP and notes she has been trying to modify positioning with kitchen work to reduce lumbar strain.  Shannon Rosario tolerated advancement of hip strengthening activities in treatment well today noting a drop in LBP following therex.  Encouraged pt. today to continue daily performance of HEP for improvement in hip strength and reduction in lumbar strain with daily tasks.  Pt. declining moist heat to end session.  Will continue to progress per pt. in coming visits.      Clinical Impairments Affecting Rehab Potential  morbid obesity    PT Treatment/Interventions  Patient/family education;ADLs/Self Care Home Management;Neuromuscular re-education;Therapeutic exercise;Therapeutic activities;Functional mobility training;Gait training;Stair training;Manual techniques;Taping;Dry needling;Electrical Stimulation;Moist Heat;Cryotherapy;Traction    Consulted and Agree with Plan of Care  Patient       Patient will benefit from skilled therapeutic intervention in order to improve the following deficits and impairments:  Pain, Impaired flexibility, Increased muscle spasms, Postural dysfunction, Improper body mechanics, Decreased range of  motion, Decreased  strength, Difficulty walking, Decreased activity tolerance  Visit Diagnosis: Chronic bilateral low back pain with bilateral sciatica  Difficulty in walking, not elsewhere classified  Muscle spasm of back     Problem List Patient Active Problem List   Diagnosis Date Noted  . 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 Fire ant bite 10/22/2016  . 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 10/22/2016  . Obstructive sleep apnea treated with continuous positive airway pressure (CPAP) 10/22/2016  . Chest pain 09/21/2015  . Essential hypertension 09/21/2015  . Diabetes ([place]) 09/21/2015  . Pain in the chest     Bess Harvest, PTA 05/21/18 11:41 AM  [facility] [address]  [place], [place], [address] Phone: [phone]   Fax:  [phone]  Name: Shannon Rosario MRN: [id number] Date of Birth: 1961-12-15

## 2018-05-26 ENCOUNTER — Ambulatory Visit: Payer: Medicaid Other | Attending: Internal Medicine

## 2018-05-26 DIAGNOSIS — R262 Difficulty in walking, not elsewhere classified: Secondary | ICD-10-CM | POA: Insufficient documentation

## 2018-05-26 DIAGNOSIS — M62830 Muscle spasm of back: Secondary | ICD-10-CM | POA: Diagnosis present

## 2018-05-26 DIAGNOSIS — G8929 Other chronic pain: Secondary | ICD-10-CM | POA: Insufficient documentation

## 2018-05-26 DIAGNOSIS — M5441 Lumbago with sciatica, right side: Secondary | ICD-10-CM | POA: Diagnosis present

## 2018-05-26 DIAGNOSIS — M5442 Lumbago with sciatica, left side: Secondary | ICD-10-CM | POA: Insufficient documentation

## 2018-05-26 NOTE — Therapy (Signed)
[facility] [address]  [place], [place], [address] Phone: [phone]   Fax:  [phone]  Physical Therapy Treatment  Patient Details  Name: Shannon Rosario MRN: [id number] Date of Birth: 1961-06-16 Referring Provider: Benito Mccreedy, MD   Encounter Date: 05/26/2018  PT End of Session - 05/26/18 1023    Visit Number  7    Number of Visits  15    Date for PT Re-Evaluation  06/16/18    Authorization Type  Medicaid    Authorization Time Period  5.13.19 - 6.23.19    Authorization - Visit Number  4    Authorization - Number of Visits  12    PT Start Time  1019    PT Stop Time  1115    PT Time Calculation (min)  56 min    Activity Tolerance  Patient tolerated treatment well    Behavior During Therapy  [facility] for tasks assessed/performed       Past Medical History:  Diagnosis Date  . Arthritis   . Depression   . Diabetes mellitus without complication ([place])   . Family history of adverse reaction to anesthesia    My Mother had some complications,I dont remember what   . GERD (gastroesophageal reflux disease)   . Heart murmur   . Hypercholesterolemia   . Hypertension   . Obesity   . OSA (obstructive sleep apnea)   . TIA (transient ischemic attack) 2012    Past Surgical History:  Procedure Laterality Date  . APPENDECTOMY    . CARPAL TUNNEL RELEASE    . CESAREAN SECTION    . CHOLECYSTECTOMY    . KNEE SURGERY    . NASAL SINUS SURGERY    . TONSILLECTOMY      There were no vitals filed for this visit.  Subjective Assessment - 05/26/18 1022    Subjective  Pt. noting increased pain over weekend without known trigger.      Pertinent History  DDD, OA, osteopenia     Patient Stated Goals  "relief of pain & better mobility"    Currently in Pain?  Yes    Pain Score  5     Pain Location  Back    Pain Orientation  Lower;Right    Pain Descriptors / Indicators  -- "Pressure"    Pain Type  Chronic pain     Pain Radiating Towards  Radiating into R lateral thigh into R foot                        [facility] Adult PT Treatment/Exercise - 05/26/18 1045      Lumbar Exercises: Stretches   Lower Trunk Rotation  10 seconds;5 reps    Piriformis Stretch  Right;2 reps;30 seconds    Piriformis Stretch Limitations  strap assisted figure-4     Other Lumbar Stretch Exercise  R sciatic nerve glides in supine with strap support for R LE 5" x 10 reps       Lumbar Exercises: Aerobic   Nustep  L5 x 7' (LE only)      Lumbar Exercises: Supine   Clam  10 reps;3 seconds    Clam Limitations  alteranting red TB at knees     Bent Knee Raise  10 reps;3 seconds    Bent Knee Raise Limitations  red TB      Modalities   Modalities  Electrical Stimulation      Moist Heat  Therapy   Number Minutes Moist Heat  15 Minutes    Moist Heat Location  Hip R buttocks       Electrical Stimulation   Electrical Stimulation Location  R lowerback/buttocks     Electrical Stimulation Action  IFC    Electrical Stimulation Parameters  to tolerance, 15'    Electrical Stimulation Goals  Pain;Tone      Manual Therapy   Manual Therapy  Soft tissue mobilization;Myofascial release;Passive ROM    Manual therapy comments  sidelying     Soft tissue mobilization  STM to R buttocks/piriformis some tenderness in paraspinals, ttp in R buttocks     Myofascial Release  TPR to R medial glute area in area of most tenderness Good relief noted following this     Passive ROM  Manual R piriformis, ITB, HS stretch with therapist x 30 sec each way              PT Education - 05/26/18 1217    Education provided  Yes    Education Details  HEP update; red looped TB issued to pt.     Person(s) Educated  Patient    Methods  Explanation;Demonstration;Verbal cues;Handout    Comprehension  Verbalized understanding;Returned demonstration;Verbal cues required;Need further instruction          PT Long Term Goals - 05/07/18 1130       PT LONG TERM GOAL #1   Title  Independent with initial HEP    Status  Achieved      PT LONG TERM GOAL #2   Title  Pt will verbalize/demonstrate understanding of proper posture and body mechanics to reduce strain on low back during daily tasks    Status  Achieved      PT LONG TERM GOAL #3   Title  Pt will report >/= 25% improvement in LBP for improved standing and walking tolerance     Status  On-going no change on pain levels       PT LONG TERM GOAL #4   Title  Pt will demostrate increased in B hip strength to 4-/5 to 4/5 for improved proximal stability    Status  On-going      PT LONG TERM GOAL #5   Title  Pt will report ability to walk for 15-20 minutes w/o reliance on UE/UB support on grocery cart w/o increased LBP to improve tolerance for grocery shopping    Status  On-going      PT LONG TERM GOAL #6   Title  Independent with ongoing HEP    Status  On-going            Plan - 05/26/18 1024    Clinical Impression Statement  Shannon Rosario seen to start session with complaint of worsening R-sided back pain, which extended into R buttocks and radiates down R LE into foot.  Pt. unsure of trigger however notes this pain has limited her over last two days with kitchen work and standing.  Pt. with TTP/increased tone in R buttocks proximal hip musculature, which was addressed with manual therapy with good reported relief.  Strengthening therex with basic supine lumbopelvic strengthening and ROM activiteis, which were well tolerated.  Ended session with E-stim/moist heat to R buttocks to decrease pain and tone.      PT Treatment/Interventions  Patient/family education;ADLs/Self Care Home Management;Neuromuscular re-education;Therapeutic exercise;Therapeutic activities;Functional mobility training;Gait training;Stair training;Manual techniques;Taping;Dry needling;Electrical Stimulation;Moist Heat;Cryotherapy;Traction    Consulted and Agree with Plan of Care  Patient  Patient will  benefit from skilled therapeutic intervention in order to improve the following deficits and impairments:  Pain, Impaired flexibility, Increased muscle spasms, Postural dysfunction, Improper body mechanics, Decreased range of motion, Decreased strength, Difficulty walking, Decreased activity tolerance  Visit Diagnosis: Chronic bilateral low back pain with bilateral sciatica  Difficulty in walking, not elsewhere classified  Muscle spasm of back     Problem List Patient Active Problem List   Diagnosis Date Noted  . 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 Fire ant bite 10/22/2016  . 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 10/22/2016  . Obstructive sleep apnea treated with continuous positive airway pressure (CPAP) 10/22/2016  . Chest pain 09/21/2015  . Essential hypertension 09/21/2015  . Diabetes ([place]) 09/21/2015  . Pain in the chest     Bess Harvest, PTA 05/26/18 12:25 PM  [facility] [address]  [place], [place], [address] Phone: [phone]   Fax:  [phone]  Name: Shannon Rosario MRN: [id number] Date of Birth: 03/20/1961

## 2018-05-28 ENCOUNTER — Ambulatory Visit: Payer: Medicaid Other

## 2018-05-28 DIAGNOSIS — M5442 Lumbago with sciatica, left side: Principal | ICD-10-CM

## 2018-05-28 DIAGNOSIS — R262 Difficulty in walking, not elsewhere classified: Secondary | ICD-10-CM

## 2018-05-28 DIAGNOSIS — M5441 Lumbago with sciatica, right side: Principal | ICD-10-CM

## 2018-05-28 DIAGNOSIS — G8929 Other chronic pain: Secondary | ICD-10-CM

## 2018-05-28 DIAGNOSIS — M62830 Muscle spasm of back: Secondary | ICD-10-CM

## 2018-05-28 NOTE — Therapy (Signed)
[facility] [address]  [place], [place], [address] Phone: [phone]   Fax:  [phone]  Physical Therapy Treatment  Patient Details  Name: Shannon Rosario MRN: [id number] Date of Birth: 03/01/1961 Referring Provider: Benito Mccreedy, MD   Encounter Date: 05/28/2018  PT End of Session - 05/28/18 1027    Visit Number  8    Number of Visits  15    Date for PT Re-Evaluation  06/16/18    Authorization Type  Medicaid    Authorization Time Period  5.13.19 - 6.23.19    Authorization - Visit Number  5    Authorization - Number of Visits  12    PT Start Time  1022    PT Stop Time  1105    PT Time Calculation (min)  43 min    Activity Tolerance  Patient tolerated treatment well    Behavior During Therapy  [facility] for tasks assessed/performed       Past Medical History:  Diagnosis Date  . Arthritis   . Depression   . Diabetes mellitus without complication ([place])   . Family history of adverse reaction to anesthesia    My Mother had some complications,I dont remember what   . GERD (gastroesophageal reflux disease)   . Heart murmur   . Hypercholesterolemia   . Hypertension   . Obesity   . OSA (obstructive sleep apnea)   . TIA (transient ischemic attack) 2012    Past Surgical History:  Procedure Laterality Date  . APPENDECTOMY    . CARPAL TUNNEL RELEASE    . CESAREAN SECTION    . CHOLECYSTECTOMY    . KNEE SURGERY    . NASAL SINUS SURGERY    . TONSILLECTOMY      There were no vitals filed for this visit.  Subjective Assessment - 05/28/18 1026    Subjective  Pt. notes a feeling of heaviness in R LE with some numbness into R foot today which she notes started Monday.     Pertinent History  DDD, OA, osteopenia     Patient Stated Goals  "relief of pain & better mobility"    Currently in Pain?  Yes    Pain Score  6     Pain Location  Back    Pain Orientation  Lower;Right    Pain Type  Chronic pain     Pain Radiating Towards  Radiating down R LE, numbness into R foot     Pain Onset  More than a month ago    Pain Frequency  Constant    Aggravating Factors   Unsure     Pain Relieving Factors  heat    Multiple Pain Sites  No                       [facility] Adult PT Treatment/Exercise - 05/28/18 1056      Lumbar Exercises: Stretches   Other Lumbar Stretch Exercise  R sciatic nerve glides in supine with strap support for R LE 5" x 10 reps       Lumbar Exercises: Aerobic   Nustep  L5 x 7' (LE only)      Lumbar Exercises: Supine   Clam  15 reps;3 seconds    Clam Limitations  alteranting red TB at knees     Bridge with [employer]  15 reps;3 seconds    Bridge with [employer] Limitations  adduction ball squeeze  Isometric Hip Flexion  10 reps;5 seconds    Isometric Hip Flexion Limitations  heels on peanut p-ball     Other Supine Lumbar Exercises  --      Modalities   Modalities  Traction      Moist Heat Therapy   Number Minutes Moist Heat  15 Minutes    Moist Heat Location  Lumbar Spine and hips       Traction   Type of Traction  Lumbar    Min (lbs)  65    Max (lbs)  80    Hold Time  60    Rest Time  20    Time  10                  PT Long Term Goals - 05/07/18 1130      PT LONG TERM GOAL #1   Title  Independent with initial HEP    Status  Achieved      PT LONG TERM GOAL #2   Title  Pt will verbalize/demonstrate understanding of proper posture and body mechanics to reduce strain on low back during daily tasks    Status  Achieved      PT LONG TERM GOAL #3   Title  Pt will report >/= 25% improvement in LBP for improved standing and walking tolerance     Status  On-going no change on pain levels       PT LONG TERM GOAL #4   Title  Pt will demostrate increased in B hip strength to 4-/5 to 4/5 for improved proximal stability    Status  On-going      PT LONG TERM GOAL #5   Title  Pt will report ability to walk for 15-20 minutes w/o reliance  on UE/UB support on grocery cart w/o increased LBP to improve tolerance for grocery shopping    Status  On-going      PT LONG TERM GOAL #6   Title  Independent with ongoing HEP    Status  On-going            Plan - 05/28/18 1028    Clinical Impression Statement  Shannon Rosario seen to start session reporting no change in LBP and some R radicular symptoms today thus initiated trial of lumbar traction at 80#/65# neutral pull with pt. tolerating well.  Remainder of session today focusing on mild progression of lumbopelvic strengthening at supine level, which was tolerated well.  Pt. leaving session noting some decrease in pain levels however will monitor response at upcoming visit.    PT Treatment/Interventions  Patient/family education;ADLs/Self Care Home Management;Neuromuscular re-education;Therapeutic exercise;Therapeutic activities;Functional mobility training;Gait training;Stair training;Manual techniques;Taping;Dry needling;Electrical Stimulation;Moist Heat;Cryotherapy;Traction    Consulted and Agree with Plan of Care  Patient       Patient will benefit from skilled therapeutic intervention in order to improve the following deficits and impairments:  Pain, Impaired flexibility, Increased muscle spasms, Postural dysfunction, Improper body mechanics, Decreased range of motion, Decreased strength, Difficulty walking, Decreased activity tolerance  Visit Diagnosis: Chronic bilateral low back pain with bilateral sciatica  Difficulty in walking, not elsewhere classified  Muscle spasm of back     Problem List Patient Active Problem List   Diagnosis Date Noted  . 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 Fire ant bite 10/22/2016  . 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 10/22/2016  . Obstructive sleep apnea treated  with continuous positive airway pressure (CPAP) 10/22/2016   . Chest pain 09/21/2015  . Essential hypertension 09/21/2015  . Diabetes ([place]) 09/21/2015  . Pain in the chest     Bess Harvest, PTA 05/28/18 11:54 AM  [facility] [address]  [place], [place], [address] Phone: [phone]   Fax:  [phone]  Name: Shannon Rosario MRN: [id number] Date of Birth: 01-Apr-1961

## 2018-06-02 ENCOUNTER — Ambulatory Visit: Payer: Medicaid Other

## 2018-06-02 DIAGNOSIS — M5441 Lumbago with sciatica, right side: Principal | ICD-10-CM

## 2018-06-02 DIAGNOSIS — M5442 Lumbago with sciatica, left side: Secondary | ICD-10-CM | POA: Diagnosis not present

## 2018-06-02 DIAGNOSIS — R262 Difficulty in walking, not elsewhere classified: Secondary | ICD-10-CM

## 2018-06-02 DIAGNOSIS — G8929 Other chronic pain: Secondary | ICD-10-CM

## 2018-06-02 DIAGNOSIS — M62830 Muscle spasm of back: Secondary | ICD-10-CM

## 2018-06-02 NOTE — Patient Instructions (Signed)

## 2018-06-02 NOTE — Therapy (Addendum)
[facility] [address]  [place], [place], [address] Phone: [phone]   Fax:  [phone]  Physical Therapy Treatment  Patient Details  Name: Shannon Rosario MRN: [id number] Date of Birth: [birth year] Referring Provider: Benito Mccreedy, MD   Encounter Date: 06/02/2018   Visit #:    9 # of visits:   15 Date for PT Re-eval. 6.24.2019  Authorization type Medicaid  Auth time period [id number] Auth visit #   6 Auth # of visits  12     Past Medical History:  Diagnosis Date  . Arthritis   . Depression   . Diabetes mellitus without complication ([place])   . Family history of adverse reaction to anesthesia    My Mother had some complications,I dont remember what   . GERD (gastroesophageal reflux disease)   . Heart murmur   . Hypercholesterolemia   . Hypertension   . Obesity   . OSA (obstructive sleep apnea)   . TIA (transient ischemic attack) 2012    Past Surgical History:  Procedure Laterality Date  . APPENDECTOMY    . CARPAL TUNNEL RELEASE    . CESAREAN SECTION    . CHOLECYSTECTOMY    . KNEE SURGERY    . NASAL SINUS SURGERY    . TONSILLECTOMY      There were no vitals filed for this visit.   Subjective: Pt. Noting increased posterior thigh soreness today without known trigger.  Currently in pain:  Yes  Pain score:   5 Pain Location    Back  Pain Orientation:   Lower; Right  Pain Description   "pressure" Multiple Pain sites:  No                    [facility] Adult PT Treatment/Exercise - 06/03/18 0001      Manual Therapy   Manual Therapy  Soft tissue mobilization;Myofascial release    Manual therapy comments  L sidelying    Soft tissue mobilization  R glutes    Myofascial Release  manual TPR to R glute mediius & minimus       Trigger [place] Dry Needling - 06/02/18 1030    Consent Given?  Yes    Education Handout Provided  Yes    Muscles Treated Lower Body   Gluteus minimus;Gluteus maximus & glute medius    Gluteus Maximus Response  Twitch response elicited;Palpable increased muscle length    Gluteus Minimus Response  Twitch response elicited;Palpable increased muscle length        Therex: NuStep: lvl 5, 7 min (LE only) Supine ab set    5" x 10 reps  Isometric hip flexion 5" x 10 reps   Modalities: E-stim./IFC:  R buttocks, to tolerance, 15';  To decrease pain and tone      PT Education - 06/02/18 1304    Education provided  Yes    Education Details  DN adductional handout with pt. verbalizing understanding    Person(s) Educated  Patient    Methods  Explanation;Verbal cues;Handout    Comprehension  Verbalized understanding;Verbal cues required;Need further instruction          PT Long Term Goals - 05/07/18 1130      PT LONG TERM GOAL #1   Title  Independent with initial HEP    Status  Achieved      PT LONG TERM GOAL #2   Title  Pt will verbalize/demonstrate understanding of proper posture and body mechanics to  reduce strain on low back during daily tasks    Status  Achieved      PT LONG TERM GOAL #3   Title  Pt will report >/= 25% improvement in LBP for improved standing and walking tolerance     Status  On-going no change on pain levels       PT LONG TERM GOAL #4   Title  Pt will demostrate increased in B hip strength to 4-/5 to 4/5 for improved proximal stability    Status  On-going      PT LONG TERM GOAL #5   Title  Pt will report ability to walk for 15-20 minutes w/o reliance on UE/UB support on grocery cart w/o increased LBP to improve tolerance for grocery shopping    Status  On-going      PT LONG TERM GOAL #6   Title  Independent with ongoing HEP    Status  On-going       Clinical Impression: Pt. noting limited relief from mechanical traction trialed last visit.  Supervising PT discussing possibility of DN with pt. today with pt. giving consent and DN general educational handout issued to pt.  Supervising  PT performed DN with pt. with pt. tolerating well with good response.  Duration of session focused on lumbopelvic strengthening and session ending with E-stim/moist heat to R buttocks to lesson post-activity tone and pain.  Will monitor response to DN at upcoming visit and progress as pt. tolerates.    Plan: Monitor response to traction progression, progress lumbopelvic strengthening into standing as pt. tolerates.      Patient will benefit from skilled therapeutic intervention in order to improve the following deficits and impairments:  Pain, Impaired flexibility, Increased muscle spasms, Postural dysfunction, Improper body mechanics, Decreased range of motion, Decreased strength, Difficulty walking, Decreased activity tolerance  Visit Diagnosis: Chronic bilateral low back pain with bilateral sciatica  Difficulty in walking, not elsewhere classified  Muscle spasm of back     Problem List Patient Active Problem List   Diagnosis Date Noted  . 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 Fire ant bite 10/22/2016  . 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 10/22/2016  . Obstructive sleep apnea treated with continuous positive airway pressure (CPAP) 10/22/2016  . Chest pain 09/21/2015  . Essential hypertension 09/21/2015  . Diabetes ([place]) 09/21/2015  . Pain in the chest     Bess Harvest, PTA 06/03/18 6:28 PM  [facility] [address]  [place], [place], [address] Phone: [phone]   Fax:  [phone]  Name: Shannon Rosario MRN: [id number] Date of Birth: 1961-04-11

## 2018-06-04 ENCOUNTER — Ambulatory Visit: Payer: Medicaid Other

## 2018-06-04 DIAGNOSIS — M5442 Lumbago with sciatica, left side: Principal | ICD-10-CM

## 2018-06-04 DIAGNOSIS — M5441 Lumbago with sciatica, right side: Principal | ICD-10-CM

## 2018-06-04 DIAGNOSIS — M62830 Muscle spasm of back: Secondary | ICD-10-CM

## 2018-06-04 DIAGNOSIS — R262 Difficulty in walking, not elsewhere classified: Secondary | ICD-10-CM

## 2018-06-04 DIAGNOSIS — G8929 Other chronic pain: Secondary | ICD-10-CM

## 2018-06-04 NOTE — Therapy (Signed)
[facility] [address]  [place], [place], [address] Phone: [phone]   Fax:  [phone]  Physical Therapy Treatment  Patient Details  Name: Shannon Rosario MRN: [id number] Date of Birth: [birth year] Referring Provider: Benito Mccreedy, MD   Encounter Date: 06/04/2018  PT End of Session - 06/04/18 1030    Visit Number  10    Number of Visits  15    Date for PT Re-Evaluation  06/16/18    Authorization Type  Medicaid    Authorization Time Period  5.13.19 - 6.23.19    Authorization - Visit Number  7    Authorization - Number of Visits  12    PT Start Time  1021    PT Stop Time  1115    PT Time Calculation (min)  54 min    Activity Tolerance  Patient tolerated treatment well    Behavior During Therapy  [facility] for tasks assessed/performed       Past Medical History:  Diagnosis Date  . Arthritis   . Depression   . Diabetes mellitus without complication ([place])   . Family history of adverse reaction to anesthesia    My Mother had some complications,I dont remember what   . GERD (gastroesophageal reflux disease)   . Heart murmur   . Hypercholesterolemia   . Hypertension   . Obesity   . OSA (obstructive sleep apnea)   . TIA (transient ischemic attack) 2012    Past Surgical History:  Procedure Laterality Date  . APPENDECTOMY    . CARPAL TUNNEL RELEASE    . CESAREAN SECTION    . CHOLECYSTECTOMY    . KNEE SURGERY    . NASAL SINUS SURGERY    . TONSILLECTOMY      There were no vitals filed for this visit.  Subjective Assessment - 06/04/18 1028    Subjective  Pt. noting 2% improvement in overall pain levels.  Notes, "My pain in R buttocks has improved".  Pt. noting some improvement with therapy thus far.  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      Pertinent History  DDD, OA, osteopenia     How long can you sit comfortably?  12 min     How long can you stand  comfortably?  10 min     How long can you walk comfortably?  5 min     Patient Stated Goals  "relief of pain & better mobility"    Currently in Pain?  Yes    Pain Score  6     Pain Location  Back    Pain Orientation  Lower;Left    Pain Radiating Towards  Radiating down R and L LE into L foot     Pain Onset  More than a month ago    Pain Frequency  Constant    Aggravating Factors   Unsure, Pressure     Pain Relieving Factors  Heat     Multiple Pain Sites  No         [facility] PT Assessment - 06/04/18 1035      AROM   AROM Assessment Site  Lumbar    Lumbar Flexion  hands to knees - tight    Lumbar Extension  30% - pain    Lumbar - Right Side Bend  hand to lateral knee    Lumbar - Left Side Bend  hand to lateral knee  Lumbar - Right Rotation  WNL    Lumbar - Left Rotation  WNL      Strength   Strength Assessment Site  Hip;Knee    Right/Left Hip  Right;Left    Right Hip Flexion  4/5    Right Hip Extension  4/5    Right Hip External Rotation   4+/5    Right Hip Internal Rotation  4+/5    Right Hip ABduction  4/5    Right Hip ADduction  4-/5    Left Hip Flexion  4-/5    Left Hip Extension  4/5    Left Hip External Rotation  4-/5    Left Hip Internal Rotation  4/5    Left Hip ABduction  4/5    Left Hip ADduction  3+/5    Right/Left Knee  Right;Left    Right Knee Flexion  4+/5    Right Knee Extension  4+/5    Left Knee Flexion  4/5    Left Knee Extension  4+/5                   [facility] Adult PT Treatment/Exercise - 06/04/18 1045      Self-Care   Self-Care  Other Self-Care Comments    Other Self-Care Comments   Discussion with pt. regarding current goal status with supervising PT and therapist; discussed what pt. wishes to continue focusing on in therapy and what she still feels she can improve upon      Lumbar Exercises: Stretches   Other Lumbar Stretch Exercise  L sciatic nerve glides 5" x 10 reps       Lumbar Exercises: Aerobic   Nustep  L5 x 7' (LE only)       Lumbar Exercises: Supine   Bridge  15 reps;5 seconds    Bridge Limitations  Cues to avoid jerky movements     Isometric Hip Flexion  5 seconds;15 reps Cues for slow/controlled motion     Isometric Hip Flexion Limitations  heels on peanut p-ball       Moist Heat Therapy   Number Minutes Moist Heat  15 Minutes    Moist Heat Location  Hip L buttocks       Traction   Type of Traction  Lumbar    Min (lbs)  70    Max (lbs)  90    Hold Time  60    Rest Time  20    Time  12                   PT Long Term Goals - 06/04/18 1034      PT LONG TERM GOAL #1   Title  Independent with initial HEP    Status  Achieved      PT LONG TERM GOAL #2   Title  Pt will verbalize/demonstrate understanding of proper posture and body mechanics to reduce strain on low back during daily tasks    Status  Achieved      PT LONG TERM GOAL #3   Title  Pt will report >/= 25% improvement in LBP for improved standing and walking tolerance     Status  On-going Minor change on pain levels       PT LONG TERM GOAL #4   Title  Pt will demostrate increased in B hip strength to 4-/5 to 4/5 for improved proximal stability    Status  Partially Met      PT LONG TERM GOAL #5  Title  Pt will report ability to walk for 15-20 minutes w/o reliance on UE/UB support on grocery cart w/o increased LBP to improve tolerance for grocery shopping    Status  Partially Met pt. able to walk with cart ~ 20 min in grocery store with less need to rely on cart with UE.         PT LONG TERM GOAL #6   Title  Independent with ongoing HEP    Status  On-going            Plan - 06/04/18 1044    Clinical Impression Statement  Pt. noting she can stand longer duration with kitchen work without as intense of LBP.  Notes she was able to walk in grocery store for ~ 20 min without needing as much support from cart.  Able to demo good improvement in LE strength with MMT.  Does note slow progress with reduction in overall pain  levels however does want to continue with therapy to focus on existing goals.  Progressed lumbar traction to end session today to 90#/75# with pt. tolerating well.  Notes good benefit from DN performed last visit and may be open to further DN in future visits.  Progressing well at this point.      PT Treatment/Interventions  Patient/family education;ADLs/Self Care Home Management;Neuromuscular re-education;Therapeutic exercise;Therapeutic activities;Functional mobility training;Gait training;Stair training;Manual techniques;Taping;Dry needling;Electrical Stimulation;Moist Heat;Cryotherapy;Traction    Consulted and Agree with Plan of Care  Patient       Patient will benefit from skilled therapeutic intervention in order to improve the following deficits and impairments:  Pain, Impaired flexibility, Increased muscle spasms, Postural dysfunction, Improper body mechanics, Decreased range of motion, Decreased strength, Difficulty walking, Decreased activity tolerance  Visit Diagnosis: Chronic bilateral low back pain with bilateral sciatica  Difficulty in walking, not elsewhere classified  Muscle spasm of back     Problem List Patient Active Problem List   Diagnosis Date Noted  . 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 Fire ant bite 10/22/2016  . 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 10/22/2016  . Obstructive sleep apnea treated with continuous positive airway pressure (CPAP) 10/22/2016  . Chest pain 09/21/2015  . Essential hypertension 09/21/2015  . Diabetes ([place]) 09/21/2015  . Pain in the chest     Bess Harvest 06/04/2018, 12:28 PM  [facility] [address]  [place], [place], [address] Phone: [phone]   Fax:  [phone]  Name: Shannon Rosario MRN: [id number] Date of Birth: [birth year]

## 2018-06-09 ENCOUNTER — Encounter: Payer: Self-pay | Admitting: Physical Therapy

## 2018-06-09 ENCOUNTER — Ambulatory Visit: Payer: Medicaid Other | Admitting: Physical Therapy

## 2018-06-09 DIAGNOSIS — M62830 Muscle spasm of back: Secondary | ICD-10-CM

## 2018-06-09 DIAGNOSIS — M5441 Lumbago with sciatica, right side: Principal | ICD-10-CM

## 2018-06-09 DIAGNOSIS — M5442 Lumbago with sciatica, left side: Secondary | ICD-10-CM | POA: Diagnosis not present

## 2018-06-09 DIAGNOSIS — G8929 Other chronic pain: Secondary | ICD-10-CM

## 2018-06-09 DIAGNOSIS — R262 Difficulty in walking, not elsewhere classified: Secondary | ICD-10-CM

## 2018-06-09 NOTE — Therapy (Signed)
[facility] [address]  [place], [place], [address] Phone: [phone]   Fax:  [phone]  Physical Therapy Treatment  Patient Details  Name: Shannon Rosario MRN: [id number] Date of Birth: 1961/05/12 Referring Provider: Benito Mccreedy, MD   Encounter Date: 06/09/2018  PT End of Session - 06/09/18 1024    Visit Number  11    Number of Visits  15    Date for PT Re-Evaluation  06/16/18    Authorization Type  Medicaid    Authorization Time Period  5.13.19 - 6.23.19    Authorization - Visit Number  8    Authorization - Number of Visits  12    PT Start Time  1024 pt arrived late    PT Stop Time  1109    PT Time Calculation (min)  45 min    Activity Tolerance  Patient tolerated treatment well    Behavior During Therapy  [facility] for tasks assessed/performed       Past Medical History:  Diagnosis Date  . Arthritis   . Depression   . Diabetes mellitus without complication ([place])   . Family history of adverse reaction to anesthesia    My Mother had some complications,I dont remember what   . GERD (gastroesophageal reflux disease)   . Heart murmur   . Hypercholesterolemia   . Hypertension   . Obesity   . OSA (obstructive sleep apnea)   . TIA (transient ischemic attack) 2012    Past Surgical History:  Procedure Laterality Date  . APPENDECTOMY    . CARPAL TUNNEL RELEASE    . CESAREAN SECTION    . CHOLECYSTECTOMY    . KNEE SURGERY    . NASAL SINUS SURGERY    . TONSILLECTOMY      There were no vitals filed for this visit.  Subjective Assessment - 06/09/18 1029    Subjective  Pt reports her back bothered all weekend - states she had been cleaning out a back room on Wed & Thurs but had to stop due to increased back pain which lasted into the weekend. Pt reports pulsing pain in her back limiting her standing tolerance with fixing Father's day dinner and noted dizziness while prepping food. Also noted  inability to tolerate lying on her     Pertinent History  DDD, OA, osteopenia     Patient Stated Goals  "relief of pain & better mobility"    Currently in Pain?  Yes    Pain Score  6     Pain Location  Back    Pain Orientation  Lower;Right    Pain Descriptors / Indicators  Discomfort    Pain Type  Chronic pain    Pain Radiating Towards  radiating up back into neck    Pain Frequency  Constant                       [facility] Adult PT Treatment/Exercise - 06/09/18 1024      Exercises   Exercises  Lumbar      Lumbar Exercises: Stretches   Single Knee to Chest Stretch  Right;30 seconds;2 reps    Piriformis Stretch  Right;30 seconds;2 reps    Piriformis Stretch Limitations  KTOS    Other Lumbar Stretch Exercise  R sciatic nerve glide in supine with towel supporting leg behind knee x10      Lumbar Exercises: Aerobic   Nustep  L5 x 6' (LE only)  Manual Therapy   Manual Therapy  Soft tissue mobilization;Myofascial release    Manual therapy comments  prone    Soft tissue mobilization  B lumbar paraspinals & R glutes    Myofascial Release  manual TPR to R glute mediius & minimus       Trigger [place] Dry Needling - 06/09/18 1024    Consent Given?  Yes    Muscles Treated Upper Body  Longissimus Rt lumbar    Muscles Treated Lower Body  Gluteus minimus;Gluteus maximus & glute medius on R    Longissimus Response  Twitch response elicited;Palpable increased muscle length    Gluteus Maximus Response  Twitch response elicited;Palpable increased muscle length    Gluteus Minimus Response  Twitch response elicited;Palpable increased muscle length                PT Long Term Goals - 06/04/18 1034      PT LONG TERM GOAL #1   Title  Independent with initial HEP    Status  Achieved      PT LONG TERM GOAL #2   Title  Pt will verbalize/demonstrate understanding of proper posture and body mechanics to reduce strain on low back during daily tasks    Status  Achieved       PT LONG TERM GOAL #3   Title  Pt will report >/= 25% improvement in LBP for improved standing and walking tolerance     Status  On-going Minor change on pain levels       PT LONG TERM GOAL #4   Title  Pt will demostrate increased in B hip strength to 4-/5 to 4/5 for improved proximal stability    Status  Partially Met      PT LONG TERM GOAL #5   Title  Pt will report ability to walk for 15-20 minutes w/o reliance on UE/UB support on grocery cart w/o increased LBP to improve tolerance for grocery shopping    Status  Partially Met pt. able to walk with cart ~ 20 min in grocery store with less need to rely on cart with UE.         PT LONG TERM GOAL #6   Title  Independent with ongoing HEP    Status  On-going            Plan - 06/09/18 1028    Clinical Impression Statement  Ruthann Cancer reporting flare-up of pain in latter part of last week extending into weekend after spending prolonged period sitting forward flexed going through boxes. Increased muscle tension and ttp over R lumbar paraspinals and glutes with limited tolerance for laying back or manual STM/MFR therefore performed DN to these muscles with positive twitch response and palpable reduction in muscle tension. Pt reporting decreased pain and better able to tolerate supine positioning following DN and manual therapy, therefore initiated review of HEP as pt nearing end of curretn Medicaid authorization. Will plan to complete HEP review and transition to HEP as of next visit.    PT Treatment/Interventions  Patient/family education;ADLs/Self Care Home Management;Neuromuscular re-education;Therapeutic exercise;Therapeutic activities;Functional mobility training;Gait training;Stair training;Manual techniques;Taping;Dry needling;Electrical Stimulation;Moist Heat;Cryotherapy;Traction    Consulted and Agree with Plan of Care  Patient       Patient will benefit from skilled therapeutic intervention in order to improve the following  deficits and impairments:  Pain, Impaired flexibility, Increased muscle spasms, Postural dysfunction, Improper body mechanics, Decreased range of motion, Decreased strength, Difficulty walking, Decreased activity tolerance  Visit Diagnosis: Chronic bilateral  low back pain with bilateral sciatica  Difficulty in walking, not elsewhere classified  Muscle spasm of back     Problem List Patient Active Problem List   Diagnosis Date Noted  . 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 Fire ant bite 10/22/2016  . 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 10/22/2016  . Obstructive sleep apnea treated with continuous positive airway pressure (CPAP) 10/22/2016  . Chest pain 09/21/2015  . Essential hypertension 09/21/2015  . Diabetes ([place]) 09/21/2015  . Pain in the chest     Percival Spanish, PT, MPT 06/09/2018, 1:42 PM  [facility] [address]  [place], [place], [address] Phone: [phone]   Fax:  [phone]  Name: Charleen Madera MRN: [id number] Date of Birth: [birth year]

## 2018-06-11 ENCOUNTER — Ambulatory Visit: Payer: Medicaid Other | Admitting: Physical Therapy

## 2018-06-11 ENCOUNTER — Encounter: Payer: Self-pay | Admitting: Physical Therapy

## 2018-06-11 DIAGNOSIS — M5442 Lumbago with sciatica, left side: Principal | ICD-10-CM

## 2018-06-11 DIAGNOSIS — M62830 Muscle spasm of back: Secondary | ICD-10-CM

## 2018-06-11 DIAGNOSIS — R262 Difficulty in walking, not elsewhere classified: Secondary | ICD-10-CM

## 2018-06-11 DIAGNOSIS — G8929 Other chronic pain: Secondary | ICD-10-CM

## 2018-06-11 DIAGNOSIS — M5441 Lumbago with sciatica, right side: Principal | ICD-10-CM

## 2018-06-11 NOTE — Therapy (Signed)
[facility] [address]  [place], [place], [address] Phone: [phone]   Fax:  [phone]  Physical Therapy Treatment  Patient Details  Name: Shannon Rosario MRN: [id number] Date of Birth: Jan 30, 1961 Referring Provider: Benito Mccreedy, MD   Encounter Date: 06/11/2018  PT End of Session - 06/11/18 1015    Visit Number  12    Number of Visits  15    Date for PT Re-Evaluation  06/16/18    Authorization Type  Medicaid    Authorization Time Period  5.13.19 - 6.23.19    Authorization - Visit Number  9    Authorization - Number of Visits  12    PT Start Time  7579    PT Stop Time  1058    PT Time Calculation (min)  43 min    Activity Tolerance  Patient tolerated treatment well    Behavior During Therapy  [facility] for tasks assessed/performed       Past Medical History:  Diagnosis Date  . Arthritis   . Depression   . Diabetes mellitus without complication ([place])   . Family history of adverse reaction to anesthesia    My Mother had some complications,I dont remember what   . GERD (gastroesophageal reflux disease)   . Heart murmur   . Hypercholesterolemia   . Hypertension   . Obesity   . OSA (obstructive sleep apnea)   . TIA (transient ischemic attack) 2012    Past Surgical History:  Procedure Laterality Date  . APPENDECTOMY    . CARPAL TUNNEL RELEASE    . CESAREAN SECTION    . CHOLECYSTECTOMY    . KNEE SURGERY    . NASAL SINUS SURGERY    . TONSILLECTOMY      There were no vitals filed for this visit.  Subjective Assessment - 06/11/18 1016    Subjective  Pt reports "things are about the same".    Pertinent History  DDD, OA, osteopenia     Limitations  Sitting;Standing;Walking;House hold activities    How long can you sit comfortably?  10-15 minutes    How long can you stand comfortably?  3-5 minutes    How long can you walk comfortably?  30 minutes with UE support on shopping cart; 7-10 minutes  w/o support    Patient Stated Goals  "relief of pain & better mobility"    Currently in Pain?  Yes    Pain Score  5     Pain Location  Back    Pain Orientation  Lower    Pain Descriptors / Indicators  Dull    Pain Type  Chronic pain    Pain Onset  More than a month ago    Aggravating Factors   up/down steps, prolonged sitting/standing    Pain Relieving Factors  heat, muscle relaxants         [facility] PT Assessment - 06/11/18 1015      Assessment   Medical Diagnosis  Acute on chronic LBP    Referring Provider  Benito Mccreedy, MD      Strength   Right Hip Flexion  4+/5    Right Hip Extension  4+/5    Right Hip External Rotation   4+/5    Right Hip Internal Rotation  4+/5    Right Hip ABduction  4/5    Right Hip ADduction  4-/5    Left Hip Flexion  4/5    Left Hip Extension  4+/5    Left Hip External Rotation  4+/5    Left Hip Internal Rotation  4+/5    Left Hip ABduction  4/5    Left Hip ADduction  4-/5    Right Knee Flexion  4+/5    Right Knee Extension  5/5    Left Knee Flexion  4+/5    Left Knee Extension  5/5                   [facility] Adult PT Treatment/Exercise - 06/11/18 1015      Exercises   Exercises  Lumbar      Lumbar Exercises: Stretches   Gastroc Stretch  Right;Left;30 seconds;2 reps      Lumbar Exercises: Aerobic   Nustep  L5 x 6' (LE only)      Lumbar Exercises: Standing   Row  Both;10 reps;Theraband;Strengthening    Theraband Level (Row)  Level 2 (Red)    Row Limitations  cues for abdominal bracing & scap retraction    Shoulder Extension  Both;10 reps;Theraband;Strengthening    Theraband Level (Shoulder Extension)  Level 2 (Red)    Shoulder Extension Limitations  cues for abdominal bracing & scap retraction      Lumbar Exercises: Supine   Clam  15 reps;3 seconds    Clam Limitations  alt hip ABD/ER with green TB    Bent Knee Raise  10 reps;3 seconds    Bent Knee Raise Limitations  brace marching with green TB    Bridge with Diona Foley  Squeeze  15 reps;3 seconds             PT Education - 06/11/18 1058    Education provided  Yes    Education Details  HEP review/update    Person(s) Educated  Patient    Methods  Explanation;Demonstration;Handout    Comprehension  Verbalized understanding;Returned demonstration          PT Long Term Goals - 06/11/18 1019      PT LONG TERM GOAL #1   Title  Independent with initial HEP    Status  Achieved      PT LONG TERM GOAL #2   Title  Pt will verbalize/demonstrate understanding of proper posture and body mechanics to reduce strain on low back during daily tasks    Status  Achieved      PT LONG TERM GOAL #3   Title  Pt will report >/= 25% improvement in LBP for improved standing and walking tolerance     Status  Not Met Only minor change on pain levels      PT LONG TERM GOAL #4   Title  Pt will demostrate increased in B hip strength to 4-/5 to 4/5 for improved proximal stability    Status  Achieved      PT LONG TERM GOAL #5   Title  Pt will report ability to walk for 15-20 minutes w/o reliance on UE/UB support on grocery cart w/o increased LBP to improve tolerance for grocery shopping    Status  Partially Met Pt able to walk 30+ min in grocery store, but still needs to rely on grocery cart      PT LONG TERM GOAL #6   Title  Independent with ongoing HEP    Status  Achieved            Plan - 06/11/18 1019    Clinical Impression Statement  Shannon Rosario reporting minimal change in back pain levels since starting PT but  has noted improvement in lower body strength and balance. She reports increased walking tolerance with grocery shopping, but continues to rely on upper body support on shopping cart, and has been trying to increase walking unsupported in her neighborhood but only going shorter distances. B LE strength improved to grossly 4/5 to 4+/5 with exception of B hip adduction 4-/5, with B LE now more symmetrical R vs L. Pt is independent with ongoing HEP. Overall  goals met or partially met with exception of perception of change in LBP. Will proceed with discharge from PT for this episode with pt to continue with walking program and HEP.    PT Treatment/Interventions  Patient/family education;ADLs/Self Care Home Management;Neuromuscular re-education;Therapeutic exercise;Therapeutic activities;Functional mobility training;Gait training;Stair training;Manual techniques;Taping;Dry needling;Electrical Stimulation;Moist Heat;Cryotherapy;Traction    PT Next Visit Plan  Discharge    Consulted and Agree with Plan of Care  Patient       Patient will benefit from skilled therapeutic intervention in order to improve the following deficits and impairments:  Pain, Impaired flexibility, Increased muscle spasms, Postural dysfunction, Improper body mechanics, Decreased range of motion, Decreased strength, Difficulty walking, Decreased activity tolerance  Visit Diagnosis: Chronic bilateral low back pain with bilateral sciatica  Difficulty in walking, not elsewhere classified  Muscle spasm of back     Problem List Patient Active Problem List   Diagnosis Date Noted  . 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 Fire ant bite 10/22/2016  . 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 10/22/2016  . Obstructive sleep apnea treated with continuous positive airway pressure (CPAP) 10/22/2016  . Chest pain 09/21/2015  . Essential hypertension 09/21/2015  . Diabetes ([place]) 09/21/2015  . Pain in the chest     PHYSICAL THERAPY DISCHARGE SUMMARY  Visits from Start of Care: 12  Current functional level related to goals / functional outcomes:   Refer to above clinical impression.   Remaining deficits:   As above.   Education / Equipment:   HEP, Hotel manager education  Plan: Patient agrees to discharge.  Patient goals were partially met. Patient is  being discharged due to                                                    plateauing with progress. ?????      Percival Spanish, PT, MPT 06/11/2018, 12:54 PM  [facility] [address]  [place], [place], [address] Phone: [phone]   Fax:  [phone]  Name: Shannon Rosario MRN: [id number] Date of Birth: Mar 31, 1961

## 2018-06-12 ENCOUNTER — Encounter: Payer: Medicaid Other | Admitting: Physical Therapy

## 2018-10-13 ENCOUNTER — Other Ambulatory Visit: Payer: Self-pay | Admitting: Sports Medicine

## 2018-10-13 DIAGNOSIS — M25571 Pain in right ankle and joints of right foot: Secondary | ICD-10-CM

## 2018-10-19 ENCOUNTER — Ambulatory Visit
Admission: RE | Admit: 2018-10-19 | Discharge: 2018-10-19 | Disposition: A | Discharge status: Home / Self Care | Payer: Medicaid Other | Source: Ambulatory Visit | Attending: Sports Medicine | Admitting: Sports Medicine

## 2018-10-19 DIAGNOSIS — M25571 Pain in right ankle and joints of right foot: Secondary | ICD-10-CM

## 2019-07-03 DIAGNOSIS — Z1389 Encounter for screening for other disorder: Secondary | ICD-10-CM | POA: Diagnosis not present

## 2019-07-03 DIAGNOSIS — E1140 Type 2 diabetes mellitus with diabetic neuropathy, unspecified: Secondary | ICD-10-CM | POA: Diagnosis not present

## 2019-07-03 DIAGNOSIS — E1165 Type 2 diabetes mellitus with hyperglycemia: Secondary | ICD-10-CM | POA: Diagnosis not present

## 2019-07-03 DIAGNOSIS — Z0001 Encounter for general adult medical examination with abnormal findings: Secondary | ICD-10-CM | POA: Diagnosis not present

## 2019-07-03 DIAGNOSIS — I10 Essential (primary) hypertension: Secondary | ICD-10-CM | POA: Diagnosis not present

## 2019-07-22 DIAGNOSIS — S93602D Unspecified sprain of left foot, subsequent encounter: Secondary | ICD-10-CM | POA: Diagnosis not present

## 2019-08-14 DIAGNOSIS — M25511 Pain in right shoulder: Secondary | ICD-10-CM | POA: Diagnosis not present

## 2019-08-14 DIAGNOSIS — M65311 Trigger thumb, right thumb: Secondary | ICD-10-CM | POA: Diagnosis not present

## 2019-08-19 DIAGNOSIS — M542 Cervicalgia: Secondary | ICD-10-CM | POA: Diagnosis not present

## 2019-08-19 DIAGNOSIS — M65311 Trigger thumb, right thumb: Secondary | ICD-10-CM | POA: Diagnosis not present

## 2019-08-19 DIAGNOSIS — R29898 Other symptoms and signs involving the musculoskeletal system: Secondary | ICD-10-CM | POA: Diagnosis not present

## 2019-08-19 DIAGNOSIS — M6281 Muscle weakness (generalized): Secondary | ICD-10-CM | POA: Diagnosis not present

## 2019-08-19 DIAGNOSIS — M25511 Pain in right shoulder: Secondary | ICD-10-CM | POA: Diagnosis not present

## 2019-08-24 DIAGNOSIS — M25511 Pain in right shoulder: Secondary | ICD-10-CM | POA: Diagnosis not present

## 2019-08-24 DIAGNOSIS — M6281 Muscle weakness (generalized): Secondary | ICD-10-CM | POA: Diagnosis not present

## 2019-08-24 DIAGNOSIS — M65311 Trigger thumb, right thumb: Secondary | ICD-10-CM | POA: Diagnosis not present

## 2019-08-24 DIAGNOSIS — R29898 Other symptoms and signs involving the musculoskeletal system: Secondary | ICD-10-CM | POA: Diagnosis not present

## 2019-08-24 DIAGNOSIS — M542 Cervicalgia: Secondary | ICD-10-CM | POA: Diagnosis not present

## 2019-08-26 DIAGNOSIS — M65311 Trigger thumb, right thumb: Secondary | ICD-10-CM | POA: Diagnosis not present

## 2019-08-26 DIAGNOSIS — M542 Cervicalgia: Secondary | ICD-10-CM | POA: Diagnosis not present

## 2019-08-26 DIAGNOSIS — R29898 Other symptoms and signs involving the musculoskeletal system: Secondary | ICD-10-CM | POA: Diagnosis not present

## 2019-08-26 DIAGNOSIS — M6281 Muscle weakness (generalized): Secondary | ICD-10-CM | POA: Diagnosis not present

## 2019-08-26 DIAGNOSIS — M25511 Pain in right shoulder: Secondary | ICD-10-CM | POA: Diagnosis not present

## 2019-09-04 DIAGNOSIS — M65311 Trigger thumb, right thumb: Secondary | ICD-10-CM | POA: Diagnosis not present

## 2019-09-04 DIAGNOSIS — R29898 Other symptoms and signs involving the musculoskeletal system: Secondary | ICD-10-CM | POA: Diagnosis not present

## 2019-09-04 DIAGNOSIS — M25511 Pain in right shoulder: Secondary | ICD-10-CM | POA: Diagnosis not present

## 2019-09-04 DIAGNOSIS — M6281 Muscle weakness (generalized): Secondary | ICD-10-CM | POA: Diagnosis not present

## 2019-09-04 DIAGNOSIS — E782 Mixed hyperlipidemia: Secondary | ICD-10-CM | POA: Diagnosis not present

## 2019-09-04 DIAGNOSIS — I10 Essential (primary) hypertension: Secondary | ICD-10-CM | POA: Diagnosis not present

## 2019-09-04 DIAGNOSIS — E1165 Type 2 diabetes mellitus with hyperglycemia: Secondary | ICD-10-CM | POA: Diagnosis not present

## 2019-09-04 DIAGNOSIS — E1140 Type 2 diabetes mellitus with diabetic neuropathy, unspecified: Secondary | ICD-10-CM | POA: Diagnosis not present

## 2019-09-04 DIAGNOSIS — M542 Cervicalgia: Secondary | ICD-10-CM | POA: Diagnosis not present

## 2019-09-07 DIAGNOSIS — R29898 Other symptoms and signs involving the musculoskeletal system: Secondary | ICD-10-CM | POA: Diagnosis not present

## 2019-09-07 DIAGNOSIS — M25511 Pain in right shoulder: Secondary | ICD-10-CM | POA: Diagnosis not present

## 2019-09-07 DIAGNOSIS — M542 Cervicalgia: Secondary | ICD-10-CM | POA: Diagnosis not present

## 2019-09-07 DIAGNOSIS — M65311 Trigger thumb, right thumb: Secondary | ICD-10-CM | POA: Diagnosis not present

## 2019-09-07 DIAGNOSIS — M6281 Muscle weakness (generalized): Secondary | ICD-10-CM | POA: Diagnosis not present

## 2019-09-09 DIAGNOSIS — M65311 Trigger thumb, right thumb: Secondary | ICD-10-CM | POA: Diagnosis not present

## 2019-09-09 DIAGNOSIS — M6281 Muscle weakness (generalized): Secondary | ICD-10-CM | POA: Diagnosis not present

## 2019-09-09 DIAGNOSIS — R29898 Other symptoms and signs involving the musculoskeletal system: Secondary | ICD-10-CM | POA: Diagnosis not present

## 2019-09-09 DIAGNOSIS — M25511 Pain in right shoulder: Secondary | ICD-10-CM | POA: Diagnosis not present

## 2019-09-09 DIAGNOSIS — M542 Cervicalgia: Secondary | ICD-10-CM | POA: Diagnosis not present

## 2019-09-09 DIAGNOSIS — Z01812 Encounter for preprocedural laboratory examination: Secondary | ICD-10-CM | POA: Diagnosis not present

## 2019-09-14 DIAGNOSIS — M65311 Trigger thumb, right thumb: Secondary | ICD-10-CM | POA: Diagnosis not present

## 2019-09-18 DIAGNOSIS — M25511 Pain in right shoulder: Secondary | ICD-10-CM | POA: Diagnosis not present

## 2019-09-18 DIAGNOSIS — R29898 Other symptoms and signs involving the musculoskeletal system: Secondary | ICD-10-CM | POA: Diagnosis not present

## 2019-09-18 DIAGNOSIS — M542 Cervicalgia: Secondary | ICD-10-CM | POA: Diagnosis not present

## 2019-09-18 DIAGNOSIS — M6281 Muscle weakness (generalized): Secondary | ICD-10-CM | POA: Diagnosis not present

## 2019-09-18 DIAGNOSIS — M65311 Trigger thumb, right thumb: Secondary | ICD-10-CM | POA: Diagnosis not present

## 2019-09-25 DIAGNOSIS — M6281 Muscle weakness (generalized): Secondary | ICD-10-CM | POA: Diagnosis not present

## 2019-09-25 DIAGNOSIS — R29898 Other symptoms and signs involving the musculoskeletal system: Secondary | ICD-10-CM | POA: Diagnosis not present

## 2019-09-25 DIAGNOSIS — M542 Cervicalgia: Secondary | ICD-10-CM | POA: Diagnosis not present

## 2019-09-25 DIAGNOSIS — M25511 Pain in right shoulder: Secondary | ICD-10-CM | POA: Diagnosis not present

## 2019-09-25 DIAGNOSIS — M65311 Trigger thumb, right thumb: Secondary | ICD-10-CM | POA: Diagnosis not present

## 2019-09-28 DIAGNOSIS — M542 Cervicalgia: Secondary | ICD-10-CM | POA: Diagnosis not present

## 2019-09-28 DIAGNOSIS — M65311 Trigger thumb, right thumb: Secondary | ICD-10-CM | POA: Diagnosis not present

## 2019-09-28 DIAGNOSIS — M25511 Pain in right shoulder: Secondary | ICD-10-CM | POA: Diagnosis not present

## 2019-09-28 DIAGNOSIS — M6281 Muscle weakness (generalized): Secondary | ICD-10-CM | POA: Diagnosis not present

## 2019-09-28 DIAGNOSIS — R29898 Other symptoms and signs involving the musculoskeletal system: Secondary | ICD-10-CM | POA: Diagnosis not present

## 2019-10-09 DIAGNOSIS — E1165 Type 2 diabetes mellitus with hyperglycemia: Secondary | ICD-10-CM | POA: Diagnosis not present

## 2019-10-09 DIAGNOSIS — E782 Mixed hyperlipidemia: Secondary | ICD-10-CM | POA: Diagnosis not present

## 2019-10-09 DIAGNOSIS — E1140 Type 2 diabetes mellitus with diabetic neuropathy, unspecified: Secondary | ICD-10-CM | POA: Diagnosis not present

## 2019-10-09 DIAGNOSIS — I10 Essential (primary) hypertension: Secondary | ICD-10-CM | POA: Diagnosis not present

## 2019-10-15 DIAGNOSIS — Z23 Encounter for immunization: Secondary | ICD-10-CM | POA: Diagnosis not present

## 2019-10-30 DIAGNOSIS — Z124 Encounter for screening for malignant neoplasm of cervix: Secondary | ICD-10-CM | POA: Diagnosis not present

## 2019-10-30 DIAGNOSIS — N950 Postmenopausal bleeding: Secondary | ICD-10-CM | POA: Diagnosis not present

## 2019-10-30 DIAGNOSIS — M542 Cervicalgia: Secondary | ICD-10-CM | POA: Diagnosis not present

## 2019-10-30 DIAGNOSIS — M549 Dorsalgia, unspecified: Secondary | ICD-10-CM | POA: Diagnosis not present

## 2019-11-05 DIAGNOSIS — L304 Erythema intertrigo: Secondary | ICD-10-CM | POA: Diagnosis not present

## 2019-11-05 DIAGNOSIS — M549 Dorsalgia, unspecified: Secondary | ICD-10-CM | POA: Diagnosis not present

## 2019-11-05 DIAGNOSIS — N62 Hypertrophy of breast: Secondary | ICD-10-CM | POA: Diagnosis not present

## 2019-11-05 DIAGNOSIS — G8929 Other chronic pain: Secondary | ICD-10-CM | POA: Diagnosis not present

## 2019-11-05 DIAGNOSIS — M542 Cervicalgia: Secondary | ICD-10-CM | POA: Diagnosis not present

## 2019-11-10 DIAGNOSIS — N950 Postmenopausal bleeding: Secondary | ICD-10-CM | POA: Diagnosis not present

## 2019-11-16 ENCOUNTER — Other Ambulatory Visit: Payer: Self-pay

## 2019-11-16 NOTE — Patient Outreach (Signed)
[facility]) Care Management  11/16/2019  Shannon Rosario [birth year] [id number]   Medication Adherence call to Mrs. Shannon Rosario [facility] Identifiers Verify spoke with patient she is past due on Lisinopril/hctz 10/12.5 mg patient explain she takes 1 tablet daily,patient has a few left but patient prefers to order it her self, patient is showing past due under [place].   [facility] Management Direct Dial [phone]  Fax [phone] [email or web address]

## 2019-11-30 DIAGNOSIS — I10 Essential (primary) hypertension: Secondary | ICD-10-CM | POA: Diagnosis not present

## 2019-11-30 DIAGNOSIS — E1165 Type 2 diabetes mellitus with hyperglycemia: Secondary | ICD-10-CM | POA: Diagnosis not present

## 2019-11-30 DIAGNOSIS — E1140 Type 2 diabetes mellitus with diabetic neuropathy, unspecified: Secondary | ICD-10-CM | POA: Diagnosis not present

## 2019-11-30 DIAGNOSIS — E782 Mixed hyperlipidemia: Secondary | ICD-10-CM | POA: Diagnosis not present

## 2019-12-07 DIAGNOSIS — I10 Essential (primary) hypertension: Secondary | ICD-10-CM | POA: Diagnosis not present

## 2019-12-07 DIAGNOSIS — E1165 Type 2 diabetes mellitus with hyperglycemia: Secondary | ICD-10-CM | POA: Diagnosis not present

## 2019-12-07 DIAGNOSIS — E1140 Type 2 diabetes mellitus with diabetic neuropathy, unspecified: Secondary | ICD-10-CM | POA: Diagnosis not present

## 2019-12-07 DIAGNOSIS — E782 Mixed hyperlipidemia: Secondary | ICD-10-CM | POA: Diagnosis not present

## 2020-02-17 DIAGNOSIS — Z1389 Encounter for screening for other disorder: Secondary | ICD-10-CM | POA: Diagnosis not present

## 2020-02-17 DIAGNOSIS — Z136 Encounter for screening for cardiovascular disorders: Secondary | ICD-10-CM | POA: Diagnosis not present

## 2020-02-17 DIAGNOSIS — E1140 Type 2 diabetes mellitus with diabetic neuropathy, unspecified: Secondary | ICD-10-CM | POA: Diagnosis not present

## 2020-02-17 DIAGNOSIS — I10 Essential (primary) hypertension: Secondary | ICD-10-CM | POA: Diagnosis not present

## 2020-02-17 DIAGNOSIS — E782 Mixed hyperlipidemia: Secondary | ICD-10-CM | POA: Diagnosis not present

## 2020-02-17 DIAGNOSIS — Z1329 Encounter for screening for other suspected endocrine disorder: Secondary | ICD-10-CM | POA: Diagnosis not present

## 2020-02-17 DIAGNOSIS — R1013 Epigastric pain: Secondary | ICD-10-CM | POA: Diagnosis not present

## 2020-02-17 DIAGNOSIS — Z0001 Encounter for general adult medical examination with abnormal findings: Secondary | ICD-10-CM | POA: Diagnosis not present

## 2020-02-29 ENCOUNTER — Other Ambulatory Visit: Payer: Self-pay | Admitting: Internal Medicine

## 2020-02-29 DIAGNOSIS — I10 Essential (primary) hypertension: Secondary | ICD-10-CM | POA: Diagnosis not present

## 2020-02-29 DIAGNOSIS — Z1231 Encounter for screening mammogram for malignant neoplasm of breast: Secondary | ICD-10-CM

## 2020-02-29 DIAGNOSIS — E1165 Type 2 diabetes mellitus with hyperglycemia: Secondary | ICD-10-CM | POA: Diagnosis not present

## 2020-02-29 DIAGNOSIS — Z7983 Long term (current) use of bisphosphonates: Secondary | ICD-10-CM

## 2020-02-29 DIAGNOSIS — E782 Mixed hyperlipidemia: Secondary | ICD-10-CM | POA: Diagnosis not present

## 2020-02-29 DIAGNOSIS — E1140 Type 2 diabetes mellitus with diabetic neuropathy, unspecified: Secondary | ICD-10-CM | POA: Diagnosis not present

## 2020-03-11 DIAGNOSIS — M8588 Other specified disorders of bone density and structure, other site: Secondary | ICD-10-CM | POA: Diagnosis not present

## 2020-03-11 DIAGNOSIS — Z1231 Encounter for screening mammogram for malignant neoplasm of breast: Secondary | ICD-10-CM | POA: Diagnosis not present

## 2020-05-14 ENCOUNTER — Emergency Department (HOSPITAL_BASED_OUTPATIENT_CLINIC_OR_DEPARTMENT_OTHER)
Admission: EM | Admit: 2020-05-14 | Discharge: 2020-05-14 | Disposition: A | Discharge status: Home / Self Care | Payer: Medicare Other | Attending: Emergency Medicine | Admitting: Emergency Medicine

## 2020-05-14 ENCOUNTER — Other Ambulatory Visit: Payer: Self-pay

## 2020-05-14 ENCOUNTER — Encounter (HOSPITAL_BASED_OUTPATIENT_CLINIC_OR_DEPARTMENT_OTHER): Payer: Self-pay | Admitting: Emergency Medicine

## 2020-05-14 DIAGNOSIS — Z7722 Contact with and (suspected) exposure to environmental tobacco smoke (acute) (chronic): Secondary | ICD-10-CM | POA: Insufficient documentation

## 2020-05-14 DIAGNOSIS — M62838 Other muscle spasm: Secondary | ICD-10-CM

## 2020-05-14 DIAGNOSIS — Z7984 Long term (current) use of oral hypoglycemic drugs: Secondary | ICD-10-CM | POA: Insufficient documentation

## 2020-05-14 DIAGNOSIS — Z7982 Long term (current) use of aspirin: Secondary | ICD-10-CM | POA: Diagnosis not present

## 2020-05-14 DIAGNOSIS — M542 Cervicalgia: Secondary | ICD-10-CM | POA: Diagnosis present

## 2020-05-14 DIAGNOSIS — Z79899 Other long term (current) drug therapy: Secondary | ICD-10-CM | POA: Insufficient documentation

## 2020-05-14 DIAGNOSIS — E119 Type 2 diabetes mellitus without complications: Secondary | ICD-10-CM | POA: Insufficient documentation

## 2020-05-14 DIAGNOSIS — I10 Essential (primary) hypertension: Secondary | ICD-10-CM | POA: Diagnosis not present

## 2020-05-14 MED ORDER — KETOROLAC TROMETHAMINE 30 MG/ML IJ SOLN
30.0000 mg | Freq: Once | INTRAMUSCULAR | Status: AC
  Administered 2020-05-14: 30 mg via INTRAVENOUS
  Filled 2020-05-14: qty 1

## 2020-05-14 MED ORDER — CYCLOBENZAPRINE HCL 5 MG PO TABS: 5.0000 mg | ORAL_TABLET | Freq: Two times a day (BID) | ORAL | 0 refills | Status: AC | PRN

## 2020-05-14 NOTE — ED Triage Notes (Signed)
R side neck pain radiating into shoulder since waking this morning. Denies injury.

## 2020-05-14 NOTE — ED Provider Notes (Signed)
[facility] EMERGENCY DEPARTMENT Provider Note   CSN: [id number] Arrival date & time: 05/14/20  1237     History Chief Complaint  Patient presents with  . Neck Pain    Shannon Rosario is a 59 y.o. female.  HPI   59 year old female with a history of depression, diabetes, GERD, heart murmur, hyperlipidemia, hypertension, obesity, sleep apnea, TIA, presenting to the ED today for eval of right neck pain. States pain started earlier this week and was mild. She used some flexeril, bengay and her husband massaged that area which seemed to improve symptoms temporarily however this morning she woke up and had worsening pain. Rates pain 10/10.  Describes pain as aching. Pain is worse when she tries to turn her head to the right. She has had similar sxs last year and was in physical therapy for it which improved the symptoms.   Denies any associated numbness/weakness, headaches, vision changes, dizziness, lightheadedness. Denies any recent injuries or falls.   Past Medical History:  Diagnosis Date  . Arthritis   . Depression   . Diabetes mellitus without complication ([place])   . Family history of adverse reaction to anesthesia    My Mother had some complications,I dont remember what   . GERD (gastroesophageal reflux disease)   . Heart murmur   . Hypercholesterolemia   . Hypertension   . Obesity   . OSA (obstructive sleep apnea)   . TIA (transient ischemic attack) 2012    Patient Active Problem List   Diagnosis Date Noted  . 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 Fire ant bite 10/22/2016  . 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 10/22/2016  . Obstructive sleep apnea treated with continuous positive airway pressure (CPAP) 10/22/2016  . Chest pain 09/21/2015  . Essential hypertension 09/21/2015  . Diabetes ([place]) 09/21/2015  . Pain in the chest      Past Surgical History:  Procedure Laterality Date  . APPENDECTOMY    . CARPAL TUNNEL RELEASE    . CESAREAN SECTION    . CHOLECYSTECTOMY    . KNEE SURGERY    . NASAL SINUS SURGERY    . TONSILLECTOMY       OB History   No obstetric history on file.     Family History  Problem Relation Age of Onset  . CAD Mother   . Asthma Mother   . Allergic rhinitis Mother   . Lupus Mother   . Allergic rhinitis Father   . Eczema Sister   . Bronchitis Sister   . Allergic rhinitis Daughter   . Food Allergy Daughter   . Bronchitis Daughter   . Allergic rhinitis Grandchild   . Eczema Grandchild   . Urticaria Grandchild   . Bronchitis Grandchild   . Allergic rhinitis Grandchild   . Bronchitis Grandchild   . Angioedema Neg Hx   . Immunodeficiency Neg Hx   . Atopy Neg Hx     Social History   Tobacco Use  . Smoking status: Passive Smoke Exposure - Never Smoker  . Smokeless tobacco: Never Used  Substance Use Topics  . Alcohol use: Yes    Comment: social  . Drug use: No    Home Medications Prior to Admission medications   Medication Sig Start Date End Date Taking? Authorizing Provider  amLODipine (NORVASC) 5 MG tablet Take 5 mg by mouth daily.    [provider]  aspirin  325 MG tablet Take 325 mg by mouth daily.    [provider]  CALCIUM PO Take 1 capsule by mouth daily.    [provider]  cetirizine (KLS ALLER-TEC) 10 MG tablet Take 10 mg by mouth at bedtime.    [provider]  cholecalciferol (VITAMIN D) 1000 UNITS tablet Take 1,000 Units by mouth daily.    [provider]  clonazePAM (KLONOPIN) 1 MG tablet Take 1 mg by mouth as needed for anxiety.    [provider]  cyclobenzaprine (FLEXERIL) 5 MG tablet Take 1 tablet (5 mg total) by mouth 2 (two) times daily as needed for up to 5 days for muscle spasms. You may take 1/2 tablet up to three times daily as needed. Do not drive, operate machinery, or work while taking this  medication. 05/14/20 05/19/20  Couture, Cortni S, PA-C  diphenhydrAMINE (BENADRYL) 25 MG tablet Take 1 tablet (25 mg total) by mouth every 8 (eight) hours. 09/19/16 09/23/16  Fatima Blank, MD  EPINEPHrine 0.3 mg/0.3 mL IJ SOAJ injection Inject 0.3 mLs (0.3 mg total) into the muscle once as needed (anaphylaxis). 09/18/16   Horton, Barbette Hair, MD  gabapentin (NEURONTIN) 300 MG capsule Take 300 mg by mouth 2 (two) times daily.    [provider]  glimepiride (AMARYL) 4 MG tablet Take 4 mg by mouth 2 (two) times daily.    [provider]  hydrOXYzine (ATARAX/VISTARIL) 25 MG tablet Take 1 tablet (25 mg total) by mouth every 6 (six) hours as needed for itching. Patient not taking: Reported on 10/22/2016 09/18/16   Horton, Barbette Hair, MD  lisinopril-hydrochlorothiazide (PRINZIDE,ZESTORETIC) 10-12.5 MG tablet Take 1 tablet by mouth daily.    [provider]  meloxicam (MOBIC) 15 MG tablet Take 15 mg by mouth as needed for pain.    [provider]  metFORMIN (GLUCOPHAGE) 1000 MG tablet Take 1,000 mg by mouth 2 (two) times daily with a meal.    [provider]  omeprazole (PRILOSEC) 20 MG capsule Take 20 mg by mouth.    [provider]  simvastatin (ZOCOR) 20 MG tablet Take 20 mg by mouth daily.    [provider]  triamcinolone cream (KENALOG) 0.5 %  09/20/16   [provider]    Allergies    Dobutamine and Pineapple  Review of Systems   Review of Systems  Constitutional: Negative for fever.  Eyes: Negative for visual disturbance.  Respiratory: Negative for shortness of breath.   Cardiovascular: Negative for chest pain.  Gastrointestinal: Negative for abdominal pain.  Genitourinary: Negative for pelvic pain.  Musculoskeletal: Positive for neck pain.  Neurological: Negative for weakness and numbness.    Physical Exam Updated Vital Signs BP (!) 125/95 (BP Location: Left Arm)   Pulse (!) 108   Temp 98.8 F (37.1 C)  (Oral)   Resp 20   Ht 5\' 9"  (1.753 m)   Wt 115.7 kg   SpO2 98%   BMI 37.66 kg/m   Physical Exam Vitals and nursing note reviewed.  Constitutional:      General: She is not in acute distress.    Appearance: She is well-developed.  HENT:     Head: Normocephalic and atraumatic.  Eyes:     Conjunctiva/sclera: Conjunctivae normal.  Neck:     Comments: No carotid bruit bilaterally Cardiovascular:     Rate and Rhythm: Normal rate.  Pulmonary:     Effort: Pulmonary effort is normal.  Musculoskeletal:  General: Normal range of motion.     Cervical back: Neck supple.     Comments: TTP to the right cervical paraspinous muscles and along the right trapezius muscle which reproduces pain.   Skin:    General: Skin is warm and dry.  Neurological:     Mental Status: She is alert.     Comments: No carotid bruit bilaterally     ED Results / Procedures / Treatments   Labs (all labs ordered are listed, but only abnormal results are displayed) Labs Reviewed - No data to display  EKG None  Radiology No results found.  Procedures Procedures (including critical care time)  Medications Ordered in ED Medications  ketorolac (TORADOL) 30 MG/ML injection 30 mg (30 mg Intravenous Given 05/14/20 1407)    ED Course  I have reviewed the triage vital signs and the nursing notes.  Pertinent labs & imaging results that were available during my care of the patient were reviewed by me and considered in my medical decision making (see chart for details).    MDM Rules/Calculators/A&P                      59 year old female presenting for evaluation of right neck and trapezius pain.  She has reproducible tenderness on exam.  She does not have any midline tenderness.  She has had no recent trauma or falls to suggest cervical spine injury.  She has had similar pain in the past that was improved by physical therapy.  On exam it appears consistent that she has a muscle spasm.  She does not have  any neuro deficits.  Will give Flexeril for home.  Advised her to make an appoint with PCP to get referred to physical therapy again.  She voices understanding of plan reasons to return.  All questions answered.  Patient stable for discharge.   Final Clinical Impression(s) / ED Diagnoses Final diagnoses:  Trapezius muscle spasm    Rx / DC Orders ED Discharge Orders         Ordered    cyclobenzaprine (FLEXERIL) 5 MG tablet  2 times daily PRN     05/14/20 1430           Couture, Cortni S, PA-C 05/14/20 1435    Lucrezia Starch, MD 05/15/20 [phone]

## 2020-05-14 NOTE — Discharge Instructions (Signed)
You may alternate taking Tylenol and Ibuprofen as needed for pain control. You may take 400-600 mg of ibuprofen every 6 hours and [phone] mg of Tylenol every 6 hours. Do not exceed 4000 mg of Tylenol daily as this can lead to liver damage. Also, make sure to take Ibuprofen with meals as it can cause an upset stomach. Do not take other NSAIDs while taking Ibuprofen such as (Aleve, Naprosyn, Aspirin, Celebrex, etc) and do not take more than the prescribed dose as this can lead to ulcers and bleeding in your GI tract. You may use warm and cold compresses to help with your symptoms.   You were given a prescription for Flexeril which is a muscle relaxer.  You should not drive, work, or operate machinery while taking this medication as it can make you very drowsy.    To help with your symptoms you can also buy over-the-counter Salonpas patches to help with the pain.  You can also buy a  Please follow up with your primary doctor within the next 7-10 days for re-evaluation and further treatment of your symptoms.   Please return to the ER sooner if you have any new or worsening symptoms.

## 2020-05-16 DIAGNOSIS — I10 Essential (primary) hypertension: Secondary | ICD-10-CM | POA: Diagnosis not present

## 2020-05-16 DIAGNOSIS — E782 Mixed hyperlipidemia: Secondary | ICD-10-CM | POA: Diagnosis not present

## 2020-05-16 DIAGNOSIS — E1142 Type 2 diabetes mellitus with diabetic polyneuropathy: Secondary | ICD-10-CM | POA: Diagnosis not present

## 2020-05-16 DIAGNOSIS — E1165 Type 2 diabetes mellitus with hyperglycemia: Secondary | ICD-10-CM | POA: Diagnosis not present

## 2020-05-18 DIAGNOSIS — M542 Cervicalgia: Secondary | ICD-10-CM | POA: Diagnosis not present

## 2020-05-18 DIAGNOSIS — M25511 Pain in right shoulder: Secondary | ICD-10-CM | POA: Diagnosis not present

## 2020-06-01 DIAGNOSIS — M5412 Radiculopathy, cervical region: Secondary | ICD-10-CM | POA: Diagnosis not present

## 2020-07-20 DIAGNOSIS — E782 Mixed hyperlipidemia: Secondary | ICD-10-CM | POA: Diagnosis not present

## 2020-07-20 DIAGNOSIS — E1142 Type 2 diabetes mellitus with diabetic polyneuropathy: Secondary | ICD-10-CM | POA: Diagnosis not present

## 2020-07-20 DIAGNOSIS — I10 Essential (primary) hypertension: Secondary | ICD-10-CM | POA: Diagnosis not present

## 2020-07-20 DIAGNOSIS — Z0001 Encounter for general adult medical examination with abnormal findings: Secondary | ICD-10-CM | POA: Diagnosis not present

## 2020-07-20 DIAGNOSIS — E1165 Type 2 diabetes mellitus with hyperglycemia: Secondary | ICD-10-CM | POA: Diagnosis not present

## 2020-08-12 DIAGNOSIS — E1165 Type 2 diabetes mellitus with hyperglycemia: Secondary | ICD-10-CM | POA: Diagnosis not present

## 2020-08-12 DIAGNOSIS — E782 Mixed hyperlipidemia: Secondary | ICD-10-CM | POA: Diagnosis not present

## 2020-08-12 DIAGNOSIS — I10 Essential (primary) hypertension: Secondary | ICD-10-CM | POA: Diagnosis not present

## 2020-08-12 DIAGNOSIS — E1142 Type 2 diabetes mellitus with diabetic polyneuropathy: Secondary | ICD-10-CM | POA: Diagnosis not present

## 2020-09-14 DIAGNOSIS — H1045 Other chronic allergic conjunctivitis: Secondary | ICD-10-CM | POA: Diagnosis not present

## 2020-09-28 DIAGNOSIS — H1045 Other chronic allergic conjunctivitis: Secondary | ICD-10-CM | POA: Diagnosis not present

## 2020-10-03 DIAGNOSIS — E1165 Type 2 diabetes mellitus with hyperglycemia: Secondary | ICD-10-CM | POA: Diagnosis not present

## 2020-10-03 DIAGNOSIS — E782 Mixed hyperlipidemia: Secondary | ICD-10-CM | POA: Diagnosis not present

## 2020-10-03 DIAGNOSIS — I10 Essential (primary) hypertension: Secondary | ICD-10-CM | POA: Diagnosis not present

## 2020-10-03 DIAGNOSIS — E1142 Type 2 diabetes mellitus with diabetic polyneuropathy: Secondary | ICD-10-CM | POA: Diagnosis not present

## 2020-10-03 DIAGNOSIS — N3000 Acute cystitis without hematuria: Secondary | ICD-10-CM | POA: Diagnosis not present

## 2020-11-23 DIAGNOSIS — H04123 Dry eye syndrome of bilateral lacrimal glands: Secondary | ICD-10-CM | POA: Diagnosis not present

## 2020-11-23 DIAGNOSIS — H02889 Meibomian gland dysfunction of unspecified eye, unspecified eyelid: Secondary | ICD-10-CM | POA: Diagnosis not present

## 2020-11-23 DIAGNOSIS — H1045 Other chronic allergic conjunctivitis: Secondary | ICD-10-CM | POA: Diagnosis not present

## 2020-12-06 DIAGNOSIS — E1165 Type 2 diabetes mellitus with hyperglycemia: Secondary | ICD-10-CM | POA: Diagnosis not present

## 2020-12-06 DIAGNOSIS — E1142 Type 2 diabetes mellitus with diabetic polyneuropathy: Secondary | ICD-10-CM | POA: Diagnosis not present

## 2020-12-06 DIAGNOSIS — E782 Mixed hyperlipidemia: Secondary | ICD-10-CM | POA: Diagnosis not present

## 2020-12-06 DIAGNOSIS — I10 Essential (primary) hypertension: Secondary | ICD-10-CM | POA: Diagnosis not present

## 2021-01-12 DIAGNOSIS — H02889 Meibomian gland dysfunction of unspecified eye, unspecified eyelid: Secondary | ICD-10-CM | POA: Diagnosis not present

## 2021-01-12 DIAGNOSIS — H04123 Dry eye syndrome of bilateral lacrimal glands: Secondary | ICD-10-CM | POA: Diagnosis not present

## 2021-02-13 DIAGNOSIS — I10 Essential (primary) hypertension: Secondary | ICD-10-CM | POA: Diagnosis not present

## 2021-02-13 DIAGNOSIS — E782 Mixed hyperlipidemia: Secondary | ICD-10-CM | POA: Diagnosis not present

## 2021-02-13 DIAGNOSIS — E1165 Type 2 diabetes mellitus with hyperglycemia: Secondary | ICD-10-CM | POA: Diagnosis not present

## 2021-02-13 DIAGNOSIS — E1142 Type 2 diabetes mellitus with diabetic polyneuropathy: Secondary | ICD-10-CM | POA: Diagnosis not present

## 2021-02-13 DIAGNOSIS — H9202 Otalgia, left ear: Secondary | ICD-10-CM | POA: Diagnosis not present

## 2021-02-27 DIAGNOSIS — E1142 Type 2 diabetes mellitus with diabetic polyneuropathy: Secondary | ICD-10-CM | POA: Diagnosis not present

## 2021-02-27 DIAGNOSIS — E782 Mixed hyperlipidemia: Secondary | ICD-10-CM | POA: Diagnosis not present

## 2021-02-27 DIAGNOSIS — I10 Essential (primary) hypertension: Secondary | ICD-10-CM | POA: Diagnosis not present

## 2021-02-27 DIAGNOSIS — R079 Chest pain, unspecified: Secondary | ICD-10-CM | POA: Diagnosis not present

## 2021-02-27 DIAGNOSIS — E1165 Type 2 diabetes mellitus with hyperglycemia: Secondary | ICD-10-CM | POA: Diagnosis not present

## 2021-03-14 ENCOUNTER — Other Ambulatory Visit: Payer: Self-pay

## 2021-03-14 ENCOUNTER — Encounter: Payer: Self-pay | Admitting: Cardiology

## 2021-03-14 ENCOUNTER — Ambulatory Visit: Payer: Medicare Other | Admitting: Cardiology

## 2021-03-14 VITALS — BP 122/87 | HR 98 | Temp 97.1°F | Resp 16 | Ht 69.0 in | Wt 266.0 lb

## 2021-03-14 DIAGNOSIS — E782 Mixed hyperlipidemia: Secondary | ICD-10-CM

## 2021-03-14 DIAGNOSIS — Z8673 Personal history of transient ischemic attack (TIA), and cerebral infarction without residual deficits: Secondary | ICD-10-CM | POA: Diagnosis not present

## 2021-03-14 DIAGNOSIS — E1165 Type 2 diabetes mellitus with hyperglycemia: Secondary | ICD-10-CM | POA: Diagnosis not present

## 2021-03-14 DIAGNOSIS — I10 Essential (primary) hypertension: Secondary | ICD-10-CM

## 2021-03-14 DIAGNOSIS — I209 Angina pectoris, unspecified: Secondary | ICD-10-CM

## 2021-03-14 DIAGNOSIS — Z8249 Family history of ischemic heart disease and other diseases of the circulatory system: Secondary | ICD-10-CM | POA: Diagnosis not present

## 2021-03-14 MED ORDER — ASPIRIN EC 81 MG PO TBEC: 81.0000 mg | DELAYED_RELEASE_TABLET | Freq: Every day | ORAL | 3 refills | Status: DC

## 2021-03-14 MED ORDER — METOPROLOL SUCCINATE ER 25 MG PO TB24: 12.5000 mg | ORAL_TABLET | Freq: Every day | ORAL | 0 refills | Status: DC

## 2021-03-14 NOTE — H&P (View-Only) (Signed)
Date:  03/14/2021   ID:  Shannon Rosario, DOB 1961-06-10, MRN [id number]  PCP:  Benito Mccreedy, MD  Cardiologist:  Rex Kras, DO, [facility] (established care 03/14/2021) Former Cardiology Providers: Dr. Hardie Lora ([place], [place])  [place]: Chest pain   REQUESTING PHYSICIAN:  Benito Mccreedy, MD [address],  [place] [id number]  Chief Complaint  Patient presents with  . Chest Pain  . New Patient (Initial Visit)    HPI  Shannon Rosario is a 60 y.o. female who presents to the office with a chief complaint of " chest pain." Patient's past medical history and cardiovascular risk factors include: hypertension, Hx of TIA, Non-insulin-dependent diabetes mellitus type 2, hyperlipidemia, obesity due to excess calories, family history of premature CAD, postmenopausal female.  She is referred to the office at the request of Osei-Bonsu, Shannon Beard, MD for evaluation of chest pain.  Chest pain: Started approximately 1 week ago, located substernally/left-sided, worse with effort late activities such as going up 1 flight of stairs, better with resting, associated with left arm pain, intensity is 4 out of 10, pressure-like sensation.  Patient saw her primary care provider and was given sublingual nitroglycerin tablets.  Patient has used it once and has required 2 pills for symptom relief.  Patient states that she has had similar discomfort approximately 10 years ago and had a left heart catheterization back in [place] by Dr. Hardie Lora and was noted to have no significant disease requiring intervention.  Patient denies orthopnea, paroxysmal nocturnal dyspnea or lower extremity swelling.  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  She consumes at least 2 to 3 cans/day.  Premature family history of heart disease: Mother had heart disease in her early 34s.   FUNCTIONAL STATUS: No structured exercise program or daily routine.    ALLERGIES: Allergies  Allergen Reactions  . Dobutamine Hypertension  . Pineapple Swelling    Reports it makes her throat swell    MEDICATION LIST PRIOR TO VISIT: Current Meds  Medication Sig  . acetic acid 2 % otic solution PLACE 5 DROPS IN EACH AFFECTED EAR 3 TIMES A DAY 5 DAYS  . amLODipine (NORVASC) 5 MG tablet Take 5 mg by mouth daily.  Marland Kitchen aspirin EC 81 MG tablet Take 1 tablet (81 mg total) by mouth daily. Swallow whole.  . cetirizine (ZYRTEC) 10 MG tablet Take 1 tablet by mouth at bedtime.  . cholecalciferol (VITAMIN D) 1000 UNITS tablet Take 1,000 Units by mouth daily.  . diphenhydrAMINE (BENADRYL) 25 MG tablet Take 1 tablet (25 mg total) by mouth every 8 (eight) hours.  Marland Kitchen EPINEPHrine 0.3 mg/0.3 mL IJ SOAJ injection Inject 0.3 mLs (0.3 mg total) into the muscle once as needed (anaphylaxis).  Marland Kitchen gabapentin (NEURONTIN) 300 MG capsule Take 300 mg by mouth 2 (two) times daily.  Marland Kitchen glimepiride (AMARYL) 4 MG tablet Take 4 mg by mouth 2 (two) times daily.  Marland Kitchen losartan-hydrochlorothiazide (HYZAAR) 50-12.5 MG tablet Take 1 tablet by mouth daily.  . meloxicam (MOBIC) 15 MG tablet Take 15 mg by mouth as needed for pain.  . metFORMIN (GLUCOPHAGE) 1000 MG tablet Take 1,000 mg by mouth 2 (two) times daily with a meal.  . metoprolol succinate (TOPROL XL) 25 MG 24 hr tablet Take 0.5 tablets (12.5 mg total) by mouth daily.  . nitroGLYCERIN (NITROSTAT) 0.4 MG SL tablet TAKE 1 TAB SUBLINGUALLY EVERY 5 MINUTES AS NEEDED  . omeprazole (PRILOSEC) 20 MG capsule Take 20 mg by  mouth.  . rosuvastatin (CRESTOR) 10 MG tablet Take 10 mg by mouth daily.  . TRULICITY 1.5 BM/8.4XL SOPN INJECT 1.5 MG SUBCUTANEOUSLY ONCE A WEEK 90 DAYS  . [DISCONTINUED] aspirin 325 MG tablet Take 325 mg by mouth daily.     PAST MEDICAL HISTORY: Past Medical History:  Diagnosis Date  . Arthritis   . Depression   . Diabetes mellitus without complication ([place])   . Family history of adverse reaction to anesthesia    My Mother  had some complications,I dont remember what   . GERD (gastroesophageal reflux disease)   . Heart murmur   . Hypercholesterolemia   . Hypertension   . Obesity   . OSA (obstructive sleep apnea)   . TIA (transient ischemic attack) 2012    PAST SURGICAL HISTORY: Past Surgical History:  Procedure Laterality Date  . APPENDECTOMY    . CARPAL TUNNEL RELEASE    . CESAREAN SECTION    . CHOLECYSTECTOMY    . KNEE SURGERY    . NASAL SINUS SURGERY    . TONSILLECTOMY      FAMILY HISTORY: The patient family history includes Allergic rhinitis in her daughter, father, grandchild, grandchild, and mother; Asthma in her mother; Bronchitis in her daughter, grandchild, grandchild, and sister; CAD in her mother; Eczema in her grandchild and sister; Food Allergy in her daughter; Lupus in her mother; Urticaria in her grandchild.  SOCIAL HISTORY:  The patient  reports that she is a non-smoker but has been exposed to tobacco smoke. She has never used smokeless tobacco. She reports previous alcohol use. She reports that she does not use drugs.  REVIEW OF SYSTEMS: Review of Systems  Constitutional: Positive for malaise/fatigue. Negative for chills and fever.  HENT: Negative for hoarse voice and nosebleeds.   Eyes: Negative for discharge, double vision and pain.  Cardiovascular: Positive for chest pain. Negative for claudication, dyspnea on exertion, leg swelling, near-syncope, orthopnea, palpitations, paroxysmal nocturnal dyspnea and syncope.  Respiratory: Negative for hemoptysis and shortness of breath.   Musculoskeletal: Negative for muscle cramps and myalgias.  Gastrointestinal: Negative for abdominal pain, constipation, diarrhea, hematemesis, hematochezia, melena, nausea and vomiting.  Neurological: Positive for dizziness. Negative for light-headedness.   PHYSICAL EXAM: Vitals with BMI 03/14/2021 05/14/2020 10/22/2016  Height _0  _1  _2   Weight 266 lbs 255 lbs 252 lbs 10 oz  BMI 39.26 24.40  10.2  Systolic 725 366 440  Diastolic 87 95 88  Pulse 98 108 84   CONSTITUTIONAL: Well-developed and well-nourished. No acute distress.  SKIN: Skin is warm and dry. No rash noted. No cyanosis. No pallor. No jaundice HEAD: Normocephalic and atraumatic.  EYES: No scleral icterus MOUTH/THROAT: Moist oral membranes.  NECK: No JVD present. No thyromegaly noted. No carotid bruits  LYMPHATIC: No visible cervical adenopathy.  CHEST Normal respiratory effort. No intercostal retractions  LUNGS: Clear to auscultation bilaterally.  No stridor. No wheezes. No rales.  CARDIOVASCULAR: Regular rate and rhythm, positive S1-S2, no murmurs rubs or gallops appreciated. ABDOMINAL: No apparent ascites.  EXTREMITIES: No peripheral edema  HEMATOLOGIC: No significant bruising NEUROLOGIC: Oriented to person, place, and time. Nonfocal. Normal muscle tone.  PSYCHIATRIC: Normal mood and affect. Normal behavior. Cooperative  CARDIAC DATABASE: EKG: 03/14/2021: Normal sinus rhythm, 91 bpm, without underlying ischemia or injury pattern.    Echocardiogram: 09/21/2015:  LVEF 60-65%, no regional wall motion abnormalities, grade 1 diastolic impairment, mildly dilated left atrium.  Stress Testing: No results found for this or any previous visit from the  past 1095 days.   Heart Catheterization: 10 years ago with Dr. Hardie Lora back in [facility].  LABORATORY DATA: CBC Latest Ref Rng & Units 09/30/2016 09/21/2015  WBC 4.0 - 10.5 K/uL 8.1 9.5  Hemoglobin 12.0 - 15.0 g/dL 12.3 12.9  Hematocrit 36.0 - 46.0 % 37.4 38.7  Platelets 150 - 400 K/uL 243 299    CMP Latest Ref Rng & Units 09/30/2016 09/21/2015  Glucose 65 - 99 mg/dL 278(H) 142(H)  BUN 6 - 20 mg/dL 11 13  Creatinine 0.44 - 1.00 mg/dL 0.83 0.86  Sodium 135 - 145 mmol/L 139 140  Potassium 3.5 - 5.1 mmol/L 3.3(L) 3.7  Chloride 101 - 111 mmol/L 106 108  CO2 22 - 32 mmol/L 25 26  Calcium 8.9 - 10.3 mg/dL 8.8(L) 8.8(L)  Total Protein 6.5 - 8.1 g/dL  6.5 -  Total Bilirubin 0.3 - 1.2 mg/dL 0.4 -  Alkaline Phos 38 - 126 U/L 57 -  AST 15 - 41 U/L 19 -  ALT 14 - 54 U/L 30 -    Lipid Panel     Component Value Date/Time   CHOL 147 09/22/2015 0405   TRIG 200 (H) 09/22/2015 0405   HDL 34 (L) 09/22/2015 0405   CHOLHDL 4.3 09/22/2015 0405   VLDL 40 09/22/2015 0405   LDLCALC 73 09/22/2015 0405    No components found for: NTPROBNP No results for input(s): PROBNP in the last 8760 hours. No results for input(s): TSH in the last 8760 hours.  BMP No results for input(s): NA, K, CL, CO2, GLUCOSE, BUN, CREATININE, CALCIUM, GFRNONAA, GFRAA in the last 8760 hours.  HEMOGLOBIN A1C Lab Results  Component Value Date   HGBA1C 7.8 (H) 09/21/2015   MPG 177 09/21/2015   External Labs: Collected: 02/27/2021 Creatinine 0.89 mg/dL. eGFR: 82 mL/min per 1.73 m Potassium 4.2. AST 20, ALT 26, alkaline phosphatase 72 Hemoglobin 13.5 g/dL, hematocrit 42% Troponin I 13 Hemoglobin A1c: 7.1  Lipid profile: Collected: 06/02/2019 Total cholesterol 145, triglycerides 328, HDL 34, LDL 45  IMPRESSION:    ICD-10-CM   1. Angina pectoris (HCC)  I20.9 EKG 12-Lead    aspirin EC 81 MG tablet    metoprolol succinate (TOPROL XL) 25 MG 24 hr tablet    Basic metabolic panel    CBC    SARS-COV-2 RNA,(COVID-19) QUAL NAAT    PCV ECHOCARDIOGRAM COMPLETE  2. Essential hypertension  I10   3. Type 2 diabetes mellitus with hyperglycemia, without long-term current use of insulin (HCC)  E11.65   4. Mixed hyperlipidemia  E78.2   5. Hx-TIA (transient ischemic attack)  Z86.73   6. Class 2 severe obesity due to excess calories with serious comorbidity and body mass index (BMI) of 39.0 to 39.9 in adult (HCC)  E66.01    Z68.39   7. Family history of premature CAD  Z82.49      RECOMMENDATIONS: Azaylea Maves is a 59 y.o. female whose past medical history and cardiac risk factors include: hypertension, Hx of TIA, Non-insulin-dependent diabetes mellitus type 2,  hyperlipidemia, obesity due to excess calories, family history of premature CAD, postmenopausal female.  Anginal chest pain: Patient symptoms of discomfort is concerning for angina pectoris. EKG shows normal sinus rhythm without underlying injury pattern. She has had a troponin checked recently by her PCP which was within normal limits. She has refused hospitalization in the recent past. Discussed ischemic evaluation with regards to stress testing, coronary CTA, or left heart catheterization. Patient has had a stress test  in the past which was reported to be abnormal requiring left heart catheterization approximately 10 years ago.  In addition, given her anginal discomfort and multiple cardiovascular risk factors including diabetes stress test would not be ideal as balanced ischemia cannot be ruled out and her pretest probability of having CAD is high.  In addition, she has pendulous breast which will predispose her to attenuation artifact as well. We discussed coronary CTA versus left heart catheterization.  Patient would like to proceed with left heart catheterization. The procedure of left heart catheterization with possible intervention was explained to the patient in detail.  The indication, alternatives, risks and benefits were reviewed.  Complications include but not limited to bleeding, infection, vascular injury, stroke, myocardial infection, arrhythmia, kidney injury, radiation-related injury in the case of prolonged fluoroscopy use, emergency cardiac surgery, and death. The patient understands the risks of serious complication is 1-2 in 0086 with diagnostic cardiac cath and 1-2% or less with angioplasty/stenting.  I have discussed in particular detail the possibility of acute renal failure after coronary angiography particularly if PCI is necessary.  I have described that it is possible patient may need short-term dialysis and the less likely may also require long-term dialysis depending on  renal recovery.  I have asked for consent to proceed with coronary angiography understanding the risks of potentially needing dialysis, as well as the risks as stated above.   The patient voices understanding and provides verbal feedback and wishes to proceed with coronary angiography with possible PCI. Echocardiogram will be ordered to evaluate for structural heart disease and left ventricular systolic function. Start Toprol-XL 12.5 mg p.o. daily Continue nitro sublingual on a as needed basis Check CBC, BMP, and Covid screen prior to left heart catheterization. Recommended transitioning to aspirin 81 mg p.o. daily from a cardiovascular standpoint given her history of TIA. Patient is asked to seek medical attention by going to the closest ER via EMS if she has worsening chest discomfort or is requiring more than 3 sublingual nitroglycerin tablets.  Benign essential hypertension: Blood pressure is currently in acceptable range. Continue antihypertensive medications. Low-salt diet recommended. Currently managed by primary care provider.  Non-insulin-dependent diabetes mellitus type 2: Most recent hemoglobin A1c reviewed.  Patient educated on the importance of glycemic control.  Currently managed by primary care provider.  FINAL MEDICATION LIST END OF ENCOUNTER: Meds ordered this encounter  Medications  . aspirin EC 81 MG tablet    Sig: Take 1 tablet (81 mg total) by mouth daily. Swallow whole.    Dispense:  90 tablet    Refill:  3  . metoprolol succinate (TOPROL XL) 25 MG 24 hr tablet    Sig: Take 0.5 tablets (12.5 mg total) by mouth daily.    Dispense:  15 tablet    Refill:  0      Current Outpatient Medications:  .  acetic acid 2 % otic solution, PLACE 5 DROPS IN EACH AFFECTED EAR 3 TIMES A DAY 5 DAYS, Disp: , Rfl:  .  amLODipine (NORVASC) 5 MG tablet, Take 5 mg by mouth daily., Disp: , Rfl:  .  aspirin EC 81 MG tablet, Take 1 tablet (81 mg total) by mouth daily. Swallow whole.,  Disp: 90 tablet, Rfl: 3 .  cetirizine (ZYRTEC) 10 MG tablet, Take 1 tablet by mouth at bedtime., Disp: , Rfl:  .  cholecalciferol (VITAMIN D) 1000 UNITS tablet, Take 1,000 Units by mouth daily., Disp: , Rfl:  .  diphenhydrAMINE (BENADRYL) 25 MG tablet, Take  1 tablet (25 mg total) by mouth every 8 (eight) hours., Disp: 12 tablet, Rfl: 0 .  EPINEPHrine 0.3 mg/0.3 mL IJ SOAJ injection, Inject 0.3 mLs (0.3 mg total) into the muscle once as needed (anaphylaxis)., Disp: 1 Device, Rfl: 0 .  gabapentin (NEURONTIN) 300 MG capsule, Take 300 mg by mouth 2 (two) times daily., Disp: , Rfl:  .  glimepiride (AMARYL) 4 MG tablet, Take 4 mg by mouth 2 (two) times daily., Disp: , Rfl:  .  losartan-hydrochlorothiazide (HYZAAR) 50-12.5 MG tablet, Take 1 tablet by mouth daily., Disp: , Rfl:  .  meloxicam (MOBIC) 15 MG tablet, Take 15 mg by mouth as needed for pain., Disp: , Rfl:  .  metFORMIN (GLUCOPHAGE) 1000 MG tablet, Take 1,000 mg by mouth 2 (two) times daily with a meal., Disp: , Rfl:  .  metoprolol succinate (TOPROL XL) 25 MG 24 hr tablet, Take 0.5 tablets (12.5 mg total) by mouth daily., Disp: 15 tablet, Rfl: 0 .  nitroGLYCERIN (NITROSTAT) 0.4 MG SL tablet, TAKE 1 TAB SUBLINGUALLY EVERY 5 MINUTES AS NEEDED, Disp: , Rfl:  .  omeprazole (PRILOSEC) 20 MG capsule, Take 20 mg by mouth., Disp: , Rfl:  .  rosuvastatin (CRESTOR) 10 MG tablet, Take 10 mg by mouth daily., Disp: , Rfl:  .  TRULICITY 1.5 JX/9.1YN SOPN, INJECT 1.5 MG SUBCUTANEOUSLY ONCE A WEEK 90 DAYS, Disp: , Rfl:   Orders Placed This Encounter  Procedures  . SARS-COV-2 RNA,(COVID-19) QUAL NAAT  . Basic metabolic panel  . CBC  . EKG 12-Lead  . PCV ECHOCARDIOGRAM COMPLETE    There are no Patient Instructions on file for this visit.   --Continue cardiac medications as reconciled in final medication list. --Return in about 2 weeks (around 03/28/2021) for Post heart catheterization. Or sooner if needed. --Continue follow-up with your primary care  physician regarding the management of your other chronic comorbid conditions.  Patient's questions and concerns were addressed to her satisfaction. She voices understanding of the instructions provided during this encounter.   This note was created using a voice recognition software as a result there may be grammatical errors inadvertently enclosed that do not reflect the nature of this encounter. Every attempt is made to correct such errors.  Rex Kras, [place], [facility]  Pager: [phone] Office: [phone]

## 2021-03-14 NOTE — Progress Notes (Signed)
Date:  03/14/2021   ID:  Shannon Rosario, DOB 1961-06-10, MRN [id number]  PCP:  Benito Mccreedy, MD  Cardiologist:  Rex Kras, DO, [facility] (established care 03/14/2021) Former Cardiology Providers: Dr. Hardie Lora ([place], [place])  [place]: Chest pain   REQUESTING PHYSICIAN:  Benito Mccreedy, MD [address],  [place] [address]  Chief Complaint  Patient presents with  . Chest Pain  . New Patient (Initial Visit)    HPI  Shannon Rosario is a 60 y.o. female who presents to the office with a chief complaint of " chest pain." Patient's past medical history and cardiovascular risk factors include: hypertension, Hx of TIA, Non-insulin-dependent diabetes mellitus type 2, hyperlipidemia, obesity due to excess calories, family history of premature CAD, postmenopausal female.  She is referred to the office at the request of Osei-Bonsu, Iona Beard, MD for evaluation of chest pain.  Chest pain: Started approximately 1 week ago, located substernally/left-sided, worse with effort late activities such as going up 1 flight of stairs, better with resting, associated with left arm pain, intensity is 4 out of 10, pressure-like sensation.  Patient saw her primary care provider and was given sublingual nitroglycerin tablets.  Patient has used it once and has required 2 pills for symptom relief.  Patient states that she has had similar discomfort approximately 10 years ago and had a left heart catheterization back in [place] by Dr. Hardie Lora and was noted to have no significant disease requiring intervention.  Patient denies orthopnea, paroxysmal nocturnal dyspnea or lower extremity swelling.  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  She consumes at least 2 to 3 cans/day.  Premature family history of heart disease: Mother had heart disease in her early 34s.   FUNCTIONAL STATUS: No structured exercise program or daily routine.    ALLERGIES: Allergies  Allergen Reactions  . Dobutamine Hypertension  . Pineapple Swelling    Reports it makes her throat swell    MEDICATION LIST PRIOR TO VISIT: Current Meds  Medication Sig  . acetic acid 2 % otic solution PLACE 5 DROPS IN EACH AFFECTED EAR 3 TIMES A DAY 5 DAYS  . amLODipine (NORVASC) 5 MG tablet Take 5 mg by mouth daily.  Marland Kitchen aspirin EC 81 MG tablet Take 1 tablet (81 mg total) by mouth daily. Swallow whole.  . cetirizine (ZYRTEC) 10 MG tablet Take 1 tablet by mouth at bedtime.  . cholecalciferol (VITAMIN D) 1000 UNITS tablet Take 1,000 Units by mouth daily.  . diphenhydrAMINE (BENADRYL) 25 MG tablet Take 1 tablet (25 mg total) by mouth every 8 (eight) hours.  Marland Kitchen EPINEPHrine 0.3 mg/0.3 mL IJ SOAJ injection Inject 0.3 mLs (0.3 mg total) into the muscle once as needed (anaphylaxis).  Marland Kitchen gabapentin (NEURONTIN) 300 MG capsule Take 300 mg by mouth 2 (two) times daily.  Marland Kitchen glimepiride (AMARYL) 4 MG tablet Take 4 mg by mouth 2 (two) times daily.  Marland Kitchen losartan-hydrochlorothiazide (HYZAAR) 50-12.5 MG tablet Take 1 tablet by mouth daily.  . meloxicam (MOBIC) 15 MG tablet Take 15 mg by mouth as needed for pain.  . metFORMIN (GLUCOPHAGE) 1000 MG tablet Take 1,000 mg by mouth 2 (two) times daily with a meal.  . metoprolol succinate (TOPROL XL) 25 MG 24 hr tablet Take 0.5 tablets (12.5 mg total) by mouth daily.  . nitroGLYCERIN (NITROSTAT) 0.4 MG SL tablet TAKE 1 TAB SUBLINGUALLY EVERY 5 MINUTES AS NEEDED  . omeprazole (PRILOSEC) 20 MG capsule Take 20 mg by  mouth.  . rosuvastatin (CRESTOR) 10 MG tablet Take 10 mg by mouth daily.  . TRULICITY 1.5 BM/8.4XL SOPN INJECT 1.5 MG SUBCUTANEOUSLY ONCE A WEEK 90 DAYS  . [DISCONTINUED] aspirin 325 MG tablet Take 325 mg by mouth daily.     PAST MEDICAL HISTORY: Past Medical History:  Diagnosis Date  . Arthritis   . Depression   . Diabetes mellitus without complication ([place])   . Family history of adverse reaction to anesthesia    My Mother  had some complications,I dont remember what   . GERD (gastroesophageal reflux disease)   . Heart murmur   . Hypercholesterolemia   . Hypertension   . Obesity   . OSA (obstructive sleep apnea)   . TIA (transient ischemic attack) 2012    PAST SURGICAL HISTORY: Past Surgical History:  Procedure Laterality Date  . APPENDECTOMY    . CARPAL TUNNEL RELEASE    . CESAREAN SECTION    . CHOLECYSTECTOMY    . KNEE SURGERY    . NASAL SINUS SURGERY    . TONSILLECTOMY      FAMILY HISTORY: The patient family history includes Allergic rhinitis in her daughter, father, grandchild, grandchild, and mother; Asthma in her mother; Bronchitis in her daughter, grandchild, grandchild, and sister; CAD in her mother; Eczema in her grandchild and sister; Food Allergy in her daughter; Lupus in her mother; Urticaria in her grandchild.  SOCIAL HISTORY:  The patient  reports that she is a non-smoker but has been exposed to tobacco smoke. She has never used smokeless tobacco. She reports previous alcohol use. She reports that she does not use drugs.  REVIEW OF SYSTEMS: Review of Systems  Constitutional: Positive for malaise/fatigue. Negative for chills and fever.  HENT: Negative for hoarse voice and nosebleeds.   Eyes: Negative for discharge, double vision and pain.  Cardiovascular: Positive for chest pain. Negative for claudication, dyspnea on exertion, leg swelling, near-syncope, orthopnea, palpitations, paroxysmal nocturnal dyspnea and syncope.  Respiratory: Negative for hemoptysis and shortness of breath.   Musculoskeletal: Negative for muscle cramps and myalgias.  Gastrointestinal: Negative for abdominal pain, constipation, diarrhea, hematemesis, hematochezia, melena, nausea and vomiting.  Neurological: Positive for dizziness. Negative for light-headedness.   PHYSICAL EXAM: Vitals with BMI 03/14/2021 05/14/2020 10/22/2016  Height _0  _1  _2   Weight 266 lbs 255 lbs 252 lbs 10 oz  BMI 39.26 24.40  10.2  Systolic 725 366 440  Diastolic 87 95 88  Pulse 98 108 84   CONSTITUTIONAL: Well-developed and well-nourished. No acute distress.  SKIN: Skin is warm and dry. No rash noted. No cyanosis. No pallor. No jaundice HEAD: Normocephalic and atraumatic.  EYES: No scleral icterus MOUTH/THROAT: Moist oral membranes.  NECK: No JVD present. No thyromegaly noted. No carotid bruits  LYMPHATIC: No visible cervical adenopathy.  CHEST Normal respiratory effort. No intercostal retractions  LUNGS: Clear to auscultation bilaterally.  No stridor. No wheezes. No rales.  CARDIOVASCULAR: Regular rate and rhythm, positive S1-S2, no murmurs rubs or gallops appreciated. ABDOMINAL: No apparent ascites.  EXTREMITIES: No peripheral edema  HEMATOLOGIC: No significant bruising NEUROLOGIC: Oriented to person, place, and time. Nonfocal. Normal muscle tone.  PSYCHIATRIC: Normal mood and affect. Normal behavior. Cooperative  CARDIAC DATABASE: EKG: 03/14/2021: Normal sinus rhythm, 91 bpm, without underlying ischemia or injury pattern.    Echocardiogram: 09/21/2015:  LVEF 60-65%, no regional wall motion abnormalities, grade 1 diastolic impairment, mildly dilated left atrium.  Stress Testing: No results found for this or any previous visit from the  past 1095 days.   Heart Catheterization: 10 years ago with Dr. Kirit Patel back in [place].  LABORATORY DATA: CBC Latest Ref Rng & Units 09/30/2016 09/21/2015  WBC 4.0 - 10.5 K/uL 8.1 9.5  Hemoglobin 12.0 - 15.0 g/dL 12.3 12.9  Hematocrit 36.0 - 46.0 % 37.4 38.7  Platelets 150 - 400 K/uL 243 299    CMP Latest Ref Rng & Units 09/30/2016 09/21/2015  Glucose 65 - 99 mg/dL 278(H) 142(H)  BUN 6 - 20 mg/dL 11 13  Creatinine 0.44 - 1.00 mg/dL 0.83 0.86  Sodium 135 - 145 mmol/L 139 140  Potassium 3.5 - 5.1 mmol/L 3.3(L) 3.7  Chloride 101 - 111 mmol/L 106 108  CO2 22 - 32 mmol/L 25 26  Calcium 8.9 - 10.3 mg/dL 8.8(L) 8.8(L)  Total Protein 6.5 - 8.1 g/dL  6.5 -  Total Bilirubin 0.3 - 1.2 mg/dL 0.4 -  Alkaline Phos 38 - 126 U/L 57 -  AST 15 - 41 U/L 19 -  ALT 14 - 54 U/L 30 -    Lipid Panel     Component Value Date/Time   CHOL 147 09/22/2015 0405   TRIG 200 (H) 09/22/2015 0405   HDL 34 (L) 09/22/2015 0405   CHOLHDL 4.3 09/22/2015 0405   VLDL 40 09/22/2015 0405   LDLCALC 73 09/22/2015 0405    No components found for: NTPROBNP No results for input(s): PROBNP in the last 8760 hours. No results for input(s): TSH in the last 8760 hours.  BMP No results for input(s): NA, K, CL, CO2, GLUCOSE, BUN, CREATININE, CALCIUM, GFRNONAA, GFRAA in the last 8760 hours.  HEMOGLOBIN A1C Lab Results  Component Value Date   HGBA1C 7.8 (H) 09/21/2015   MPG 177 09/21/2015   External Labs: Collected: 02/27/2021 Creatinine 0.89 mg/dL. eGFR: 82 mL/min per 1.73 m Potassium 4.2. AST 20, ALT 26, alkaline phosphatase 72 Hemoglobin 13.5 g/dL, hematocrit 42% Troponin I 13 Hemoglobin A1c: 7.1  Lipid profile: Collected: 06/02/2019 Total cholesterol 145, triglycerides 328, HDL 34, LDL 45  IMPRESSION:    ICD-10-CM   1. Angina pectoris (HCC)  I20.9 EKG 12-Lead    aspirin EC 81 MG tablet    metoprolol succinate (TOPROL XL) 25 MG 24 hr tablet    Basic metabolic panel    CBC    SARS-COV-2 RNA,(COVID-19) QUAL NAAT    PCV ECHOCARDIOGRAM COMPLETE  2. Essential hypertension  I10   3. Type 2 diabetes mellitus with hyperglycemia, without long-term current use of insulin (HCC)  E11.65   4. Mixed hyperlipidemia  E78.2   5. Hx-TIA (transient ischemic attack)  Z86.73   6. Class 2 severe obesity due to excess calories with serious comorbidity and body mass index (BMI) of 39.0 to 39.9 in adult (HCC)  E66.01    Z68.39   7. Family history of premature CAD  Z82.49      RECOMMENDATIONS: Shannon Rosario is a 59 y.o. female whose past medical history and cardiac risk factors include: hypertension, Hx of TIA, Non-insulin-dependent diabetes mellitus type 2,  hyperlipidemia, obesity due to excess calories, family history of premature CAD, postmenopausal female.  Anginal chest pain: Patient symptoms of discomfort is concerning for angina pectoris. EKG shows normal sinus rhythm without underlying injury pattern. She has had a troponin checked recently by her PCP which was within normal limits. She has refused hospitalization in the recent past. Discussed ischemic evaluation with regards to stress testing, coronary CTA, or left heart catheterization. Patient has had a stress test   in the past which was reported to be abnormal requiring left heart catheterization approximately 10 years ago.  In addition, given her anginal discomfort and multiple cardiovascular risk factors including diabetes stress test would not be ideal as balanced ischemia cannot be ruled out and her pretest probability of having CAD is high.  In addition, she has pendulous breast which will predispose her to attenuation artifact as well. We discussed coronary CTA versus left heart catheterization.  Patient would like to proceed with left heart catheterization. The procedure of left heart catheterization with possible intervention was explained to the patient in detail.  The indication, alternatives, risks and benefits were reviewed.  Complications include but not limited to bleeding, infection, vascular injury, stroke, myocardial infection, arrhythmia, kidney injury, radiation-related injury in the case of prolonged fluoroscopy use, emergency cardiac surgery, and death. The patient understands the risks of serious complication is 1-2 in 0086 with diagnostic cardiac cath and 1-2% or less with angioplasty/stenting.  I have discussed in particular detail the possibility of acute renal failure after coronary angiography particularly if PCI is necessary.  I have described that it is possible patient may need short-term dialysis and the less likely may also require long-term dialysis depending on  renal recovery.  I have asked for consent to proceed with coronary angiography understanding the risks of potentially needing dialysis, as well as the risks as stated above.   The patient voices understanding and provides verbal feedback and wishes to proceed with coronary angiography with possible PCI. Echocardiogram will be ordered to evaluate for structural heart disease and left ventricular systolic function. Start Toprol-XL 12.5 mg p.o. daily Continue nitro sublingual on a as needed basis Check CBC, BMP, and Covid screen prior to left heart catheterization. Recommended transitioning to aspirin 81 mg p.o. daily from a cardiovascular standpoint given her history of TIA. Patient is asked to seek medical attention by going to the closest ER via EMS if she has worsening chest discomfort or is requiring more than 3 sublingual nitroglycerin tablets.  Benign essential hypertension: Blood pressure is currently in acceptable range. Continue antihypertensive medications. Low-salt diet recommended. Currently managed by primary care provider.  Non-insulin-dependent diabetes mellitus type 2: Most recent hemoglobin A1c reviewed.  Patient educated on the importance of glycemic control.  Currently managed by primary care provider.  FINAL MEDICATION LIST END OF ENCOUNTER: Meds ordered this encounter  Medications  . aspirin EC 81 MG tablet    Sig: Take 1 tablet (81 mg total) by mouth daily. Swallow whole.    Dispense:  90 tablet    Refill:  3  . metoprolol succinate (TOPROL XL) 25 MG 24 hr tablet    Sig: Take 0.5 tablets (12.5 mg total) by mouth daily.    Dispense:  15 tablet    Refill:  0      Current Outpatient Medications:  .  acetic acid 2 % otic solution, PLACE 5 DROPS IN EACH AFFECTED EAR 3 TIMES A DAY 5 DAYS, Disp: , Rfl:  .  amLODipine (NORVASC) 5 MG tablet, Take 5 mg by mouth daily., Disp: , Rfl:  .  aspirin EC 81 MG tablet, Take 1 tablet (81 mg total) by mouth daily. Swallow whole.,  Disp: 90 tablet, Rfl: 3 .  cetirizine (ZYRTEC) 10 MG tablet, Take 1 tablet by mouth at bedtime., Disp: , Rfl:  .  cholecalciferol (VITAMIN D) 1000 UNITS tablet, Take 1,000 Units by mouth daily., Disp: , Rfl:  .  diphenhydrAMINE (BENADRYL) 25 MG tablet, Take  1 tablet (25 mg total) by mouth every 8 (eight) hours., Disp: 12 tablet, Rfl: 0 .  EPINEPHrine 0.3 mg/0.3 mL IJ SOAJ injection, Inject 0.3 mLs (0.3 mg total) into the muscle once as needed (anaphylaxis)., Disp: 1 Device, Rfl: 0 .  gabapentin (NEURONTIN) 300 MG capsule, Take 300 mg by mouth 2 (two) times daily., Disp: , Rfl:  .  glimepiride (AMARYL) 4 MG tablet, Take 4 mg by mouth 2 (two) times daily., Disp: , Rfl:  .  losartan-hydrochlorothiazide (HYZAAR) 50-12.5 MG tablet, Take 1 tablet by mouth daily., Disp: , Rfl:  .  meloxicam (MOBIC) 15 MG tablet, Take 15 mg by mouth as needed for pain., Disp: , Rfl:  .  metFORMIN (GLUCOPHAGE) 1000 MG tablet, Take 1,000 mg by mouth 2 (two) times daily with a meal., Disp: , Rfl:  .  metoprolol succinate (TOPROL XL) 25 MG 24 hr tablet, Take 0.5 tablets (12.5 mg total) by mouth daily., Disp: 15 tablet, Rfl: 0 .  nitroGLYCERIN (NITROSTAT) 0.4 MG SL tablet, TAKE 1 TAB SUBLINGUALLY EVERY 5 MINUTES AS NEEDED, Disp: , Rfl:  .  omeprazole (PRILOSEC) 20 MG capsule, Take 20 mg by mouth., Disp: , Rfl:  .  rosuvastatin (CRESTOR) 10 MG tablet, Take 10 mg by mouth daily., Disp: , Rfl:  .  TRULICITY 1.5 JX/9.1YN SOPN, INJECT 1.5 MG SUBCUTANEOUSLY ONCE A WEEK 90 DAYS, Disp: , Rfl:   Orders Placed This Encounter  Procedures  . SARS-COV-2 RNA,(COVID-19) QUAL NAAT  . Basic metabolic panel  . CBC  . EKG 12-Lead  . PCV ECHOCARDIOGRAM COMPLETE    There are no Patient Instructions on file for this visit.   --Continue cardiac medications as reconciled in final medication list. --Return in about 2 weeks (around 03/28/2021) for Post heart catheterization. Or sooner if needed. --Continue follow-up with your primary care  physician regarding the management of your other chronic comorbid conditions.  Patient's questions and concerns were addressed to her satisfaction. She voices understanding of the instructions provided during this encounter.   This note was created using a voice recognition software as a result there may be grammatical errors inadvertently enclosed that do not reflect the nature of this encounter. Every attempt is made to correct such errors.  Rex Kras, [place], [facility]  Pager: [phone] Office: [phone]

## 2021-03-15 ENCOUNTER — Encounter (HOSPITAL_COMMUNITY): Payer: Self-pay | Admitting: *Deleted

## 2021-03-15 ENCOUNTER — Other Ambulatory Visit: Payer: Self-pay

## 2021-03-15 ENCOUNTER — Emergency Department (HOSPITAL_COMMUNITY): Payer: Medicare Other

## 2021-03-15 ENCOUNTER — Observation Stay (HOSPITAL_COMMUNITY)
Admission: EM | Admit: 2021-03-15 | Discharge: 2021-03-16 | Disposition: A | Discharge status: Home / Self Care | Payer: Medicare Other | Attending: Internal Medicine | Admitting: Internal Medicine

## 2021-03-15 DIAGNOSIS — R079 Chest pain, unspecified: Secondary | ICD-10-CM | POA: Diagnosis not present

## 2021-03-15 DIAGNOSIS — I10 Essential (primary) hypertension: Secondary | ICD-10-CM | POA: Diagnosis not present

## 2021-03-15 DIAGNOSIS — Z8673 Personal history of transient ischemic attack (TIA), and cerebral infarction without residual deficits: Secondary | ICD-10-CM | POA: Diagnosis not present

## 2021-03-15 DIAGNOSIS — Z7722 Contact with and (suspected) exposure to environmental tobacco smoke (acute) (chronic): Secondary | ICD-10-CM | POA: Diagnosis not present

## 2021-03-15 DIAGNOSIS — Z7984 Long term (current) use of oral hypoglycemic drugs: Secondary | ICD-10-CM | POA: Insufficient documentation

## 2021-03-15 DIAGNOSIS — R0602 Shortness of breath: Secondary | ICD-10-CM | POA: Diagnosis not present

## 2021-03-15 DIAGNOSIS — Z79899 Other long term (current) drug therapy: Secondary | ICD-10-CM | POA: Insufficient documentation

## 2021-03-15 DIAGNOSIS — R531 Weakness: Secondary | ICD-10-CM | POA: Diagnosis not present

## 2021-03-15 DIAGNOSIS — E119 Type 2 diabetes mellitus without complications: Secondary | ICD-10-CM | POA: Insufficient documentation

## 2021-03-15 DIAGNOSIS — I25110 Atherosclerotic heart disease of native coronary artery with unstable angina pectoris: Principal | ICD-10-CM | POA: Insufficient documentation

## 2021-03-15 DIAGNOSIS — E1165 Type 2 diabetes mellitus with hyperglycemia: Secondary | ICD-10-CM | POA: Diagnosis not present

## 2021-03-15 DIAGNOSIS — I200 Unstable angina: Secondary | ICD-10-CM | POA: Diagnosis not present

## 2021-03-15 DIAGNOSIS — Z20822 Contact with and (suspected) exposure to covid-19: Secondary | ICD-10-CM | POA: Insufficient documentation

## 2021-03-15 DIAGNOSIS — Z7982 Long term (current) use of aspirin: Secondary | ICD-10-CM | POA: Diagnosis not present

## 2021-03-15 LAB — TROPONIN I (HIGH SENSITIVITY)
Troponin I (High Sensitivity): 3 ng/L (ref <18)
Troponin I (High Sensitivity): 3 ng/L (ref <18)

## 2021-03-15 LAB — BASIC METABOLIC PANEL
Anion gap: 8 (ref 5–15)
BUN: 9 mg/dL (ref 6–20)
CO2: 24 mmol/L (ref 22–32)
Calcium: 8.9 mg/dL (ref 8.9–10.3)
Chloride: 108 mmol/L (ref 98–111)
Creatinine, Ser: 0.85 mg/dL (ref 0.44–1.00)
GFR, Estimated: >60 mL/min (ref >60)
Glucose, Bld: 122 mg/dL — ABNORMAL HIGH (ref 70–99)
Potassium: 3.7 mmol/L (ref 3.5–5.1)
Sodium: 140 mmol/L (ref 135–145)

## 2021-03-15 LAB — CBC
HCT: 38.4 % (ref 36.0–46.0)
Hemoglobin: 12.8 g/dL (ref 12.0–15.0)
MCH: 25.4 pg — ABNORMAL LOW (ref 26.0–34.0)
MCHC: 33.3 g/dL (ref 30.0–36.0)
MCV: 76.3 fL — ABNORMAL LOW (ref 80.0–100.0)
Platelets: 328 10*3/uL (ref 150–400)
RBC: 5.03 MIL/uL (ref 3.87–5.11)
RDW: 15.1 % (ref 11.5–15.5)
WBC: 8.8 10*3/uL (ref 4.0–10.5)
nRBC: 0 % (ref 0.0–0.2)

## 2021-03-15 MED ORDER — ASPIRIN 81 MG PO CHEW
324.0000 mg | CHEWABLE_TABLET | Freq: Once | ORAL | Status: AC
  Administered 2021-03-15: 324 mg via ORAL
  Filled 2021-03-15: qty 4

## 2021-03-15 MED ORDER — NITROGLYCERIN IN D5W 200-5 MCG/ML-% IV SOLN
0.0000 ug/min | INTRAVENOUS | Status: DC
  Administered 2021-03-15: 50 ug/min via INTRAVENOUS
  Filled 2021-03-15: qty 250

## 2021-03-15 MED ORDER — HEPARIN BOLUS VIA INFUSION
4000.0000 [IU] | Freq: Once | INTRAVENOUS | Status: AC
  Administered 2021-03-16: 4000 [IU] via INTRAVENOUS
  Filled 2021-03-15: qty 4000

## 2021-03-15 MED ORDER — HEPARIN (PORCINE) 25000 UT/250ML-% IV SOLN
1150.0000 [IU]/h | INTRAVENOUS | Status: DC
  Administered 2021-03-16: 1150 [IU]/h via INTRAVENOUS
  Filled 2021-03-15: qty 250

## 2021-03-15 NOTE — ED Provider Notes (Signed)
[facility] EMERGENCY DEPARTMENT Provider Note   CSN: [id number] Arrival date & time: 03/15/21  2008     History Chief Complaint  Patient presents with  . Chest Pain    Shannon Rosario is a 60 y.o. female.  Patient presents to the emergency department with a chief complaint of chest pain.  She states that the pain awakened her from sleep this morning.  She reports having intermittent episodes of chest pain throughout the day today.  She has been using sublingual nitroglycerin to control her symptoms.  She states that the pain is worsened with exertion and she also feels short of breath when she is exerting herself.  Additionally, patient complains of some nausea, but denies any vomiting.  She states that she was seen by her cardiologist yesterday, and was told that she needs to have a heart catheterization.  This is scheduled for 03/28/2021.  She states that the pain was about a 8 out of 10 when she checked in to the ER, but now is a 2 out of 10.  The history is provided by the patient. No language interpreter was used.       Past Medical History:  Diagnosis Date  . Arthritis   . Depression   . Diabetes mellitus without complication ([place])   . Family history of adverse reaction to anesthesia    My Mother had some complications,I dont remember what   . GERD (gastroesophageal reflux disease)   . Heart murmur   . Hypercholesterolemia   . Hypertension   . Obesity   . OSA (obstructive sleep apnea)   . TIA (transient ischemic attack) 2012    Patient Active Problem List   Diagnosis Date Noted  . 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 Fire ant bite 10/22/2016  . 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 10/22/2016  . Obstructive sleep apnea treated with continuous positive airway pressure (CPAP) 10/22/2016  . Chest pain 09/21/2015  . Essential  hypertension 09/21/2015  . Diabetes ([place]) 09/21/2015  . Pain in the chest     Past Surgical History:  Procedure Laterality Date  . APPENDECTOMY    . CARPAL TUNNEL RELEASE    . CESAREAN SECTION    . CHOLECYSTECTOMY    . KNEE SURGERY    . NASAL SINUS SURGERY    . TONSILLECTOMY       OB History   No obstetric history on file.     Family History  Problem Relation Age of Onset  . CAD Mother   . Asthma Mother   . Allergic rhinitis Mother   . Lupus Mother   . Allergic rhinitis Father   . Eczema Sister   . Bronchitis Sister   . Allergic rhinitis Daughter   . Food Allergy Daughter   . Bronchitis Daughter   . Allergic rhinitis Grandchild   . Eczema Grandchild   . Urticaria Grandchild   . Bronchitis Grandchild   . Allergic rhinitis Grandchild   . Bronchitis Grandchild   . Angioedema Neg Hx   . Immunodeficiency Neg Hx   . Atopy Neg Hx     Social History   Tobacco Use  . Smoking status: Passive Smoke Exposure - Never Smoker  . Smokeless tobacco: Never Used  Vaping Use  . Vaping Use: Never used  Substance Use Topics  . Alcohol use: Not Currently    Comment: social  .  Drug use: No    Home Medications Prior to Admission medications   Medication Sig Start Date End Date Taking? Authorizing Provider  acetic acid 2 % otic solution PLACE 5 DROPS IN EACH AFFECTED EAR 3 TIMES A DAY 5 DAYS 02/17/21   [provider]  amLODipine (NORVASC) 5 MG tablet Take 5 mg by mouth daily.    [provider]  aspirin EC 81 MG tablet Take 1 tablet (81 mg total) by mouth daily. Swallow whole. 03/14/21   Tolia, Sunit, DO  cetirizine (ZYRTEC) 10 MG tablet Take 1 tablet by mouth at bedtime.    [provider]  cholecalciferol (VITAMIN D) 1000 UNITS tablet Take 1,000 Units by mouth daily.    [provider]  diphenhydrAMINE (BENADRYL) 25 MG tablet Take 1 tablet (25 mg total) by mouth every 8 (eight) hours. 09/19/16 09/23/16  Fatima Blank, MD  EPINEPHrine  0.3 mg/0.3 mL IJ SOAJ injection Inject 0.3 mLs (0.3 mg total) into the muscle once as needed (anaphylaxis). 09/18/16   Horton, Barbette Hair, MD  gabapentin (NEURONTIN) 300 MG capsule Take 300 mg by mouth 2 (two) times daily.    [provider]  glimepiride (AMARYL) 4 MG tablet Take 4 mg by mouth 2 (two) times daily.    [provider]  losartan-hydrochlorothiazide (HYZAAR) 50-12.5 MG tablet Take 1 tablet by mouth daily. 12/10/20   [provider]  meloxicam (MOBIC) 15 MG tablet Take 15 mg by mouth as needed for pain.    [provider]  metFORMIN (GLUCOPHAGE) 1000 MG tablet Take 1,000 mg by mouth 2 (two) times daily with a meal.    [provider]  metoprolol succinate (TOPROL XL) 25 MG 24 hr tablet Take 0.5 tablets (12.5 mg total) by mouth daily. 03/14/21 04/13/21  Tolia, Sunit, DO  nitroGLYCERIN (NITROSTAT) 0.4 MG SL tablet TAKE 1 TAB SUBLINGUALLY EVERY 5 MINUTES AS NEEDED 02/27/21   [provider]  omeprazole (PRILOSEC) 20 MG capsule Take 20 mg by mouth.    [provider]  rosuvastatin (CRESTOR) 10 MG tablet Take 10 mg by mouth daily. 12/06/20   [provider]  TRULICITY 1.5 ZJ/6.7HA SOPN INJECT 1.5 MG SUBCUTANEOUSLY ONCE A WEEK 90 DAYS 01/19/21   [provider]    Allergies    Dobutamine and Pineapple  Review of Systems   Review of Systems  All other systems reviewed and are negative.   Physical Exam Updated Vital Signs BP (!) 138/102 (BP Location: Right Arm)   Pulse 99   Temp 99.1 F (37.3 C) (Oral)   Resp 20   SpO2 99%   Physical Exam Vitals and nursing note reviewed.  Constitutional:      General: She is not in acute distress.    Appearance: She is well-developed.  HENT:     Head: Normocephalic and atraumatic.  Eyes:     Conjunctiva/sclera: Conjunctivae normal.  Cardiovascular:     Rate and Rhythm: Normal rate and regular rhythm.     Heart sounds: No murmur heard.   Pulmonary:     Effort:  Pulmonary effort is normal. No respiratory distress.     Breath sounds: Normal breath sounds.  Abdominal:     Palpations: Abdomen is soft.     Tenderness: There is no abdominal tenderness.  Musculoskeletal:        General: Normal range of motion.     Cervical back: Neck supple.  Skin:    General: Skin is warm and dry.  Neurological:     Mental Status: She is alert and oriented to person, place, and time.  Psychiatric:        Mood and Affect: Mood normal.        Behavior: Behavior normal.     ED Results / Procedures / Treatments   Labs (all labs ordered are listed, but only abnormal results are displayed) Labs Reviewed  BASIC METABOLIC PANEL - Abnormal; Notable for the following components:      Result Value   Glucose, Bld 122 (*)    All other components within normal limits  CBC - Abnormal; Notable for the following components:   MCV 76.3 (*)    MCH 25.4 (*)    All other components within normal limits  I-STAT BETA HCG BLOOD, ED ([facility], [facility], [facility] ONLY)  TROPONIN I (HIGH SENSITIVITY)  TROPONIN I (HIGH SENSITIVITY)    EKG None  Radiology DG Chest 2 View  Result Date: 03/15/2021 CLINICAL DATA:  Chest pain, shortness of breath, weakness EXAM: CHEST - 2 VIEW COMPARISON:  09/21/2015 FINDINGS: Dominantly linear densities are noted in the lingula, likely scarring. Right lung clear. Heart is normal size. No effusions. No acute bony abnormality. IMPRESSION: Lingular scarring.  No active disease. Electronically Signed   By: Rolm Baptise M.D.   On: 03/15/2021 20:59    Procedures .Critical Care Performed by: Montine Circle, PA-C Authorized by: Montine Circle, PA-C   Critical care provider statement:    Critical care time (minutes):  40   Critical care was necessary to treat or prevent imminent or life-threatening deterioration of the following conditions: unstable angina, heparin and nitro infusions.   Critical care was time spent personally by me on the following activities:   Discussions with consultants, evaluation of patient's response to treatment, examination of patient, ordering and performing treatments and interventions, ordering and review of laboratory studies, ordering and review of radiographic studies, pulse oximetry, re-evaluation of patient's condition, obtaining history from patient or surrogate and review of old charts     Medications Ordered in ED Medications  aspirin chewable tablet 324 mg (has no administration in time range)  nitroGLYCERIN 50 mg in dextrose 5 % 250 mL (0.2 mg/mL) infusion (has no administration in time range)    ED Course  I have reviewed the triage vital signs and the nursing notes.  Pertinent labs & imaging results that were available during my care of the patient were reviewed by me and considered in my medical decision making (see chart for details).    MDM Rules/Calculators/A&P                          This patient complains of CP, this involves an extensive number of treatment options, and is a complaint that carries with it a high risk of complications and morbidity.    Differential Dx ACS, PE, covid, pneumonia  Pertinent Labs I ordered, reviewed, and interpreted labs, which included CBC, BMP, trop which are all reassuring.  First trop is 3.  Repeat pending.  Imaging Interpretation I ordered imaging studies which included CXR.  I independently visualized and interpreted the CXR, which showed no obvious acute abnormality, radiology reports lingular scarring.   Medications I ordered medication aspirin, nitro infusion, heparin drip for unstable angina.  Sources Previous records obtained and reviewed and show evaluation by cardiology yesterday.  LHC scheduled for 03/28/21.   Critical Interventions  Heparin and nitro infusions  Reassessments After the interventions stated above,  I reevaluated the patient and found feeling somewhat improved with nitro.  Consultants 67 Dr. Virgina Jock, who will consult be  recommends medicine admit. Unassigned medicine for admission.  Discussed with residents, who will admit.  Appreciate help from consultants.  Plan Admit    Final Clinical Impression(s) / ED Diagnoses Final diagnoses:  Unstable angina [facility])    Rx / DC Orders ED Discharge Orders    None       Montine Circle, PA-C 03/15/21 2330    Isla Pence, MD 03/18/21 [phone]

## 2021-03-15 NOTE — H&P (Signed)
Date: 03/16/2021               Patient Name:  Shannon Rosario MRN: [id number]  DOB: [birth year] Age / Sex: 60 y.o., female   PCP: Benito Mccreedy, MD         Medical Service: Internal Medicine Teaching Service         Attending Physician: Dr. Aldine Contes, MD    First Contact: Dr. Sanjuana Letters Pager: [phone]  Second Contact: Dr. Marianna Payment Pager: [phone]       After Hours (After 5p/  First Contact Pager: [phone]  weekends / holidays): Second Contact Pager: [phone]   Chief Complaint: chest pain  History of Present Illness: Ms Shannon Rosario is a 60 year old female with PMHx of diabetes mellitus, hypertension, hyperlipidemia, OSA and GERD presenting with left sided chest pain. She notes one week history of left anterior chest pain radiating to her left arm and intermittently left shoulder and left neck. Current episode started yesterday evening around 4AM with left sided chest pain and "heaviness" for which she took two nitroglycerins that provided some improvement in pain but not complete resolution. She notes that she has been having increasing fatigue today with worsening dyspnea on exertion. She notes that given her persistent chest pain, her husband and sister encouraged her to present to the ED. Currently has "funny feeling" in chest but no active chest pressure/pain. Does have some arm "warmth" but denies any numbness/tingling.   She notes similar episodes in the past but has not had any symptoms following this for the past 4-5 years until about 1 week ago. She notes having a cardiac catheterization 10 years ago but did not have any intervention following this.   She drinks red bull (4-5 cans daily) but notes that she has not had any over the past four days. She had caffeine-products yesterday and the day prior; however, no caffeine intake today.She recently stopped taking her blood pressure medication because her blood pressure was "good" over the past  four months. Notes her BP ranges 130/70-80 but over the past 2-3 weeks has been up up to 130s/90s.   She denies fever, chills, diaphoresis, nausea, vomiting, dyspnea, abdominal pain, dizziness, or syncope. Significant heart disease in maternal side of family with mother having approximately five myocardial infarctions before death.   Meds:  Current Meds  Medication Sig  . amLODipine (NORVASC) 5 MG tablet Take 5 mg by mouth daily.  Marland Kitchen aspirin EC 81 MG tablet Take 1 tablet (81 mg total) by mouth daily. Swallow whole.  . cetirizine (ZYRTEC) 10 MG tablet Take 1 tablet by mouth at bedtime.  . cholecalciferol (VITAMIN D) 1000 UNITS tablet Take 1,000 Units by mouth daily.  Marland Kitchen EPINEPHrine 0.3 mg/0.3 mL IJ SOAJ injection Inject 0.3 mLs (0.3 mg total) into the muscle once as needed (anaphylaxis).  Marland Kitchen gabapentin (NEURONTIN) 300 MG capsule Take 300 mg by mouth 2 (two) times daily.  Marland Kitchen glimepiride (AMARYL) 4 MG tablet Take 4 mg by mouth 2 (two) times daily.  Marland Kitchen losartan-hydrochlorothiazide (HYZAAR) 50-12.5 MG tablet Take 1 tablet by mouth daily.  . meloxicam (MOBIC) 15 MG tablet Take 15 mg by mouth as needed for pain.  . metFORMIN (GLUCOPHAGE) 1000 MG tablet Take 1,000 mg by mouth 2 (two) times daily with a meal.  . metoprolol succinate (TOPROL XL) 25 MG 24 hr tablet Take 0.5 tablets (12.5 mg total) by mouth daily.  . nitroGLYCERIN (NITROSTAT) 0.4 MG SL tablet TAKE 1 TAB  SUBLINGUALLY EVERY 5 MINUTES AS NEEDED  . omeprazole (PRILOSEC) 20 MG capsule Take 20 mg by mouth daily.  . rosuvastatin (CRESTOR) 10 MG tablet Take 10 mg by mouth daily.  . TRULICITY 1.5 XT/0.6YI SOPN Inject 1.5 mg into the skin once a week.   Allergies: Allergies as of 03/15/2021 - Review Complete 03/15/2021  Allergen Reaction Noted  . Bee venom Anaphylaxis 09/09/2019  . Dobutamine Hypertension 09/21/2015  . Pineapple Swelling 09/19/2016   Past Medical History:  Diagnosis Date  . Arthritis   . Depression   . Diabetes mellitus  without complication ([place])   . Family history of adverse reaction to anesthesia    My Mother had some complications,I dont remember what   . GERD (gastroesophageal reflux disease)   . Heart murmur   . Hypercholesterolemia   . Hypertension   . Obesity   . OSA (obstructive sleep apnea)   . TIA (transient ischemic attack) 2012    Family History:  Maternal side - heart disease, diabetes, strokes, dementia  Paternal side - kidney disease, dementia   Social History:  Patient lives with her husband and two granddaughters in [place], [place].  Denies any tobacco use or illicit drug use. Notes social alcohol use with occasional wine.  Review of Systems: A complete ROS was negative except as per HPI.   Physical Exam: Blood pressure 115/79, pulse 78, temperature 99.1 F (37.3 C), temperature source Oral, resp. rate 17, SpO2 93 %. Physical Exam Constitutional:      General: She is not in acute distress.    Appearance: She is well-developed. She is obese.  HENT:     Head: Normocephalic and atraumatic.  Cardiovascular:     Rate and Rhythm: Normal rate and regular rhythm.     Pulses:          Radial pulses are 2+ on the right side and 2+ on the left side.       Dorsalis pedis pulses are 2+ on the right side and 2+ on the left side.     Heart sounds: Normal heart sounds. No murmur heard. No friction rub. No gallop.   Pulmonary:     Effort: Pulmonary effort is normal. No respiratory distress.     Breath sounds: Normal breath sounds.  Abdominal:     General: Bowel sounds are normal.     Palpations: Abdomen is soft. There is no mass.  Musculoskeletal:     Right lower leg: No edema.     Left lower leg: No edema.  Skin:    General: Skin is warm.     Capillary Refill: Capillary refill takes less than 2 seconds.  Neurological:     General: No focal deficit present.     Mental Status: She is alert and oriented to person, place, and time.  Psychiatric:        Mood and Affect: Mood normal.         Behavior: Behavior normal.     EKG: personally reviewed my interpretation is normal sinus rhythm, no significant ST segment changes  CXR: personally reviewed my interpretation is lingular scarring, no cardiomegaly, interstitial markings or effusions noted.   Assessment & Plan by Problem: Active Problems:   Unstable angina [facility])  Ms Zitlali Primm is a 60 year old female with PMHx of diabetes mellitus, hypertension, hyperlipidemia, OSA and GERD presenting with left sided chest pain and admitted for unstable angina.  Unstable angina Presents for left sided chest pain radiating to shoulder, neck, and  arm. Follows with [facility] and recently seen on 3/22 for this started on metoprolol with plan for echo and heart catherterization in April. Now having angina with minimal exertion improved somewhat with nitroglycerin and ASA. Prior cath 10 years ago without significant cardiac disease.  Vitals stable. No electrolyte abnormalities. EKG without acute ischemic changes, troponin 3 x2. Worsening chest pain at rest concerning for unstable angina. Cardiac risk factors include hypertension, diabetes, hyperlipidemia, obesity, OSA, prior TIA, and family history. ED provider discussed with Dr. Virgina Jock recommended starting on heparin and will evaluate later tonight. - Cardiology consulted appreciate recommendations - Continue heparin infusion - ASA 81 mg  - Nitroglycerin 0.4 mg as needed for chest pain - Continue metoprolol 12.5 mg daily - Follow up TSH - Monitor on tele - PT  Hypertension Hyperlipidemia States she has not been taking antihypertensives for the past 4 months. Home medications: amlodipine 5 mg daily, losartan-HCTZ 50-12.5mg  daily, metoprolol 12.5 mg daily. Has been normotensive here. -amlodipine 5 mg daily -metoprolol 12.5 daily -rosuvastatin 10 mg daily  Diabetes mellitus Home medications include glimepiride 4 mg twice daily, metformin 1000 mg twice daily,  and trulicity 1.5 mg weekly. Last hemoglobin A1c of 7 in 2016.  -A1c -SSI resistant -CBG monitoring  OSA Patient on CPAP but states she has difficulty sleeping with it. Does endorse fatigue and frequent nighttime awakenings. May be contributing to her angina. - CPAP at night  GERD pantoprazole 40 mg dialy  Dispo: Admit patient to Observation with expected length of stay less than 2 midnights.  Signed: Iona Beard, MD 03/16/2021, 2:18 AM  Pager: [phone] After 5pm on weekdays and 1pm on weekends: On Call pager: [phone]

## 2021-03-15 NOTE — ED Triage Notes (Addendum)
Pt c/o cp x2 weeks, radiating into R arm with NV, chills, and fatigue. Pt took two nitros  With some relief in chest pain

## 2021-03-15 NOTE — Progress Notes (Signed)
[facility] for Heparin Indication: Unsatable Angina  Allergies  Allergen Reactions  . Dobutamine Hypertension  . Pineapple Swelling    Reports it makes her throat swell    Patient Measurements:   Heparin Dosing Weight: 94.1 kg  Vital Signs: Temp: 99.1 F (37.3 C) (03/23 2013) Temp Source: Oral (03/23 2013) BP: 138/102 (03/23 2013) Pulse Rate: 99 (03/23 2013)  Labs: Recent Labs    03/15/21 2028  HGB 12.8  HCT 38.4  PLT 328  CREATININE 0.85  TROPONINIHS 3    Estimated Creatinine Clearance: 97.8 mL/min (by C-G formula based on SCr of 0.85 mg/dL).   Medical History: Past Medical History:  Diagnosis Date  . Arthritis   . Depression   . Diabetes mellitus without complication ([place])   . Family history of adverse reaction to anesthesia    My Mother had some complications,I dont remember what   . GERD (gastroesophageal reflux disease)   . Heart murmur   . Hypercholesterolemia   . Hypertension   . Obesity   . OSA (obstructive sleep apnea)   . TIA (transient ischemic attack) 2012    Medications:  Scheduled:  . heparin  4,000 Units Intravenous Once    Assessment: Patient is a 3 yof that presents to the ED with cp. The patient does have normal trops at this time. Pharmacy has been asked to dose heparin at this time for Unstable angina.   Goal of Therapy:  Heparin level 0.3-0.7 units/ml Monitor platelets by anticoagulation protocol: Yes   Plan:  - Heparin bolus 4000 units IV x 1 dose - Heparin drip @ 1150 units/hr - Heparin level in ~ 6 hours  - Monitor patient for s/s of bleeding and CBC while on heparin   Duanne Limerick PharmD. BCPS  03/15/2021,11:16 PM

## 2021-03-16 ENCOUNTER — Observation Stay (HOSPITAL_COMMUNITY): Payer: Medicare Other

## 2021-03-16 ENCOUNTER — Encounter (HOSPITAL_COMMUNITY)
Admission: EM | Disposition: A | Discharge status: Home / Self Care | Payer: Self-pay | Source: Home / Self Care | Attending: Emergency Medicine

## 2021-03-16 DIAGNOSIS — Z8673 Personal history of transient ischemic attack (TIA), and cerebral infarction without residual deficits: Secondary | ICD-10-CM | POA: Diagnosis not present

## 2021-03-16 DIAGNOSIS — R718 Other abnormality of red blood cells: Secondary | ICD-10-CM | POA: Diagnosis not present

## 2021-03-16 DIAGNOSIS — E1169 Type 2 diabetes mellitus with other specified complication: Secondary | ICD-10-CM | POA: Diagnosis not present

## 2021-03-16 DIAGNOSIS — E785 Hyperlipidemia, unspecified: Secondary | ICD-10-CM | POA: Diagnosis not present

## 2021-03-16 DIAGNOSIS — I10 Essential (primary) hypertension: Secondary | ICD-10-CM

## 2021-03-16 DIAGNOSIS — I200 Unstable angina: Secondary | ICD-10-CM | POA: Diagnosis not present

## 2021-03-16 DIAGNOSIS — E119 Type 2 diabetes mellitus without complications: Secondary | ICD-10-CM

## 2021-03-16 HISTORY — PX: LEFT HEART CATH AND CORONARY ANGIOGRAPHY: CATH118249

## 2021-03-16 LAB — BASIC METABOLIC PANEL
Anion gap: 10 (ref 5–15)
BUN: 8 mg/dL (ref 6–20)
CO2: 24 mmol/L (ref 22–32)
Calcium: 8.8 mg/dL — ABNORMAL LOW (ref 8.9–10.3)
Chloride: 106 mmol/L (ref 98–111)
Creatinine, Ser: 0.91 mg/dL (ref 0.44–1.00)
GFR, Estimated: >60 mL/min (ref >60)
Glucose, Bld: 120 mg/dL — ABNORMAL HIGH (ref 70–99)
Potassium: 3.7 mmol/L (ref 3.5–5.1)
Sodium: 140 mmol/L (ref 135–145)

## 2021-03-16 LAB — CBC
HCT: 37.4 % (ref 36.0–46.0)
Hemoglobin: 12 g/dL (ref 12.0–15.0)
MCH: 24.8 pg — ABNORMAL LOW (ref 26.0–34.0)
MCHC: 32.1 g/dL (ref 30.0–36.0)
MCV: 77.4 fL — ABNORMAL LOW (ref 80.0–100.0)
Platelets: 287 10*3/uL (ref 150–400)
RBC: 4.83 MIL/uL (ref 3.87–5.11)
RDW: 15.2 % (ref 11.5–15.5)
WBC: 9.9 10*3/uL (ref 4.0–10.5)
nRBC: 0 % (ref 0.0–0.2)

## 2021-03-16 LAB — PROTIME-INR
INR: 1 (ref 0.8–1.2)
Prothrombin Time: 13.1 seconds (ref 11.4–15.2)

## 2021-03-16 LAB — TSH: TSH: 2.345 u[IU]/mL (ref 0.350–4.500)

## 2021-03-16 LAB — LIPID PANEL
Cholesterol: 121 mg/dL (ref 0–200)
HDL: 37 mg/dL — ABNORMAL LOW (ref >40)
LDL Cholesterol: 48 mg/dL (ref 0–99)
Total CHOL/HDL Ratio: 3.3 RATIO
Triglycerides: 181 mg/dL — ABNORMAL HIGH (ref <150)
VLDL: 36 mg/dL (ref 0–40)

## 2021-03-16 LAB — HEPARIN LEVEL (UNFRACTIONATED): Heparin Unfractionated: 0.55 IU/mL (ref 0.30–0.70)

## 2021-03-16 LAB — APTT: aPTT: 88 seconds — ABNORMAL HIGH (ref 24–36)

## 2021-03-16 LAB — HEMOGLOBIN A1C
Hgb A1c MFr Bld: 7.3 % — ABNORMAL HIGH (ref 4.8–5.6)
Mean Plasma Glucose: 162.81 mg/dL

## 2021-03-16 LAB — HIV ANTIBODY (ROUTINE TESTING W REFLEX): HIV Screen 4th Generation wRfx: NONREACTIVE

## 2021-03-16 LAB — CBG MONITORING, ED: Glucose-Capillary: 122 mg/dL — ABNORMAL HIGH (ref 70–99)

## 2021-03-16 LAB — GLUCOSE, CAPILLARY: Glucose-Capillary: 131 mg/dL — ABNORMAL HIGH (ref 70–99)

## 2021-03-16 LAB — SARS CORONAVIRUS 2 (TAT 6-24 HRS): SARS Coronavirus 2: NEGATIVE

## 2021-03-16 SURGERY — LEFT HEART CATH AND CORONARY ANGIOGRAPHY
Anesthesia: LOCAL

## 2021-03-16 MED ORDER — LIDOCAINE HCL (PF) 1 % IJ SOLN
INTRAMUSCULAR | Status: AC
  Filled 2021-03-16: qty 30

## 2021-03-16 MED ORDER — SODIUM CHLORIDE 0.9% FLUSH
3.0000 mL | Freq: Two times a day (BID) | INTRAVENOUS | Status: DC
  Administered 2021-03-16: 3 mL via INTRAVENOUS

## 2021-03-16 MED ORDER — LIDOCAINE HCL (PF) 1 % IJ SOLN
INTRAMUSCULAR | Status: DC | PRN
  Administered 2021-03-16: 3 mL

## 2021-03-16 MED ORDER — VERAPAMIL HCL 2.5 MG/ML IV SOLN
INTRAVENOUS | Status: AC
  Filled 2021-03-16: qty 2

## 2021-03-16 MED ORDER — AMLODIPINE BESYLATE 5 MG PO TABS: 5.0000 mg | ORAL_TABLET | Freq: Every day | ORAL | Status: DC

## 2021-03-16 MED ORDER — NITROGLYCERIN 0.4 MG SL SUBL: 0.4000 mg | SUBLINGUAL_TABLET | SUBLINGUAL | Status: DC | PRN

## 2021-03-16 MED ORDER — HEPARIN (PORCINE) IN NACL 1000-0.9 UT/500ML-% IV SOLN
INTRAVENOUS | Status: AC
  Filled 2021-03-16: qty 1500

## 2021-03-16 MED ORDER — ROSUVASTATIN CALCIUM 5 MG PO TABS: 10.0000 mg | ORAL_TABLET | Freq: Every day | ORAL | Status: DC

## 2021-03-16 MED ORDER — METOPROLOL SUCCINATE ER 25 MG PO TB24: 12.5000 mg | ORAL_TABLET | Freq: Every day | ORAL | 0 refills | Status: DC

## 2021-03-16 MED ORDER — SODIUM CHLORIDE 0.9 % IV SOLN: INTRAVENOUS | Status: AC

## 2021-03-16 MED ORDER — ACETAMINOPHEN 650 MG RE SUPP: 650.0000 mg | Freq: Four times a day (QID) | RECTAL | Status: DC | PRN

## 2021-03-16 MED ORDER — HEPARIN SODIUM (PORCINE) 1000 UNIT/ML IJ SOLN
INTRAMUSCULAR | Status: AC
  Filled 2021-03-16: qty 1

## 2021-03-16 MED ORDER — METOPROLOL SUCCINATE ER 25 MG PO TB24: 12.5000 mg | ORAL_TABLET | Freq: Every day | ORAL | Status: DC

## 2021-03-16 MED ORDER — MIDAZOLAM HCL 2 MG/2ML IJ SOLN
INTRAMUSCULAR | Status: AC
  Filled 2021-03-16: qty 2

## 2021-03-16 MED ORDER — MIDAZOLAM HCL 2 MG/2ML IJ SOLN
INTRAMUSCULAR | Status: DC | PRN
  Administered 2021-03-16: 1 mg via INTRAVENOUS

## 2021-03-16 MED ORDER — VERAPAMIL HCL 2.5 MG/ML IV SOLN: INTRAVENOUS | Status: DC | PRN

## 2021-03-16 MED ORDER — INSULIN ASPART 100 UNIT/ML ~~LOC~~ SOLN: 0.0000 [IU] | Freq: Three times a day (TID) | SUBCUTANEOUS | Status: DC

## 2021-03-16 MED ORDER — ASPIRIN EC 81 MG PO TBEC
81.0000 mg | DELAYED_RELEASE_TABLET | Freq: Every day | ORAL | Status: DC
Start: 2021-03-16 — End: 2021-03-16

## 2021-03-16 MED ORDER — FENTANYL CITRATE (PF) 100 MCG/2ML IJ SOLN
INTRAMUSCULAR | Status: DC | PRN
  Administered 2021-03-16: 50 ug via INTRAVENOUS

## 2021-03-16 MED ORDER — PANTOPRAZOLE SODIUM 40 MG PO TBEC: 40.0000 mg | DELAYED_RELEASE_TABLET | Freq: Every day | ORAL | Status: DC

## 2021-03-16 MED ORDER — IOHEXOL 350 MG/ML SOLN
INTRAVENOUS | Status: DC | PRN
  Administered 2021-03-16: 30 mL

## 2021-03-16 MED ORDER — ACETAMINOPHEN 325 MG PO TABS: 650.0000 mg | ORAL_TABLET | Freq: Four times a day (QID) | ORAL | Status: DC | PRN

## 2021-03-16 MED ORDER — HEPARIN (PORCINE) IN NACL 1000-0.9 UT/500ML-% IV SOLN
INTRAVENOUS | Status: DC | PRN
  Administered 2021-03-16 (×2): 500 mL

## 2021-03-16 MED ORDER — FENTANYL CITRATE (PF) 100 MCG/2ML IJ SOLN
INTRAMUSCULAR | Status: AC
  Filled 2021-03-16: qty 2

## 2021-03-16 MED ORDER — HEPARIN SODIUM (PORCINE) 1000 UNIT/ML IJ SOLN
INTRAMUSCULAR | Status: DC | PRN
  Administered 2021-03-16: 6000 [IU] via INTRAVENOUS

## 2021-03-16 SURGICAL SUPPLY — 9 items
CATH OPTITORQUE TIG 4.0 5F (CATHETERS) ×2 IMPLANT
DEVICE RAD COMP TR BAND LRG (VASCULAR PRODUCTS) ×2 IMPLANT
GLIDESHEATH SLEND A-KIT 6F 22G (SHEATH) ×2 IMPLANT
GUIDEWIRE INQWIRE 1.5J.035X260 (WIRE) ×1 IMPLANT
INQWIRE 1.5J .035X260CM (WIRE) ×2
KIT HEART LEFT (KITS) ×2 IMPLANT
PACK CARDIAC CATHETERIZATION (CUSTOM PROCEDURE TRAY) ×2 IMPLANT
TRANSDUCER W/STOPCOCK (MISCELLANEOUS) ×2 IMPLANT
TUBING CIL FLEX 10 FLL-RA (TUBING) ×2 IMPLANT

## 2021-03-16 NOTE — ED Notes (Signed)
Report received on this patient at this time.

## 2021-03-16 NOTE — ED Notes (Signed)
Pt to cath lab with 2 rns on monitor

## 2021-03-16 NOTE — Consult Note (Signed)
CARDIOLOGY CONSULT NOTE  Patient ID: Shannon Rosario MRN: [id number] DOB/AGE: [birth year] 60 y.o.  Admit date: 03/15/2021 Attending physician: Aldine Contes, MD Primary Physician:  Benito Mccreedy, MD Outpatient Cardiologist: Rex Kras, DO, [facility] Inpatient Cardiologist: Rex Kras, DO, [facility]  Chief complaint: Chest pain  HPI:  Shannon Rosario is a 60 y.o. African-American female who presents with a chief complaint of " chest pain." Her past medical history and cardiovascular risk factors include:  hypertension, Hx of TIA, Non-insulin-dependent diabetes mellitus type 2, hyperlipidemia, obesity due to excess calories, family history of premature CAD, postmenopausal female.  Patient presents as a hospital with a chief complaint of chest pain.  Patient states that symptoms started approximately at 4 AM yesterday morning and it woke her up from her sleep.  The discomfort is located on his left anterior chest wall and substernal, intensity 9 out of 10 prior to coming to the ER and currently 2 out of 10 while on IV heparin drip.  The discomfort also radiates to her left arm, neck, and jaw.  Patient states that at home she took 2 sublingual nitroglycerin tablets which helped her symptoms but did not resolve it.  He she continued to have pain throughout the day and finally was convinced by her husband and sister to come to the ER for further evaluation and management.  Associated symptoms also included nausea.  No vomiting.  She also has chest discomfort at times with effort related activities and does relief with rest.  She was scheduled to have a elective left heart catheterization on 03/28/2021.  ALLERGIES: Allergies  Allergen Reactions  . Bee Venom Anaphylaxis  . Pineapple Swelling    Reports it makes her throat swell  . Dobutamine Hypertension    PAST MEDICAL HISTORY: Past Medical History:  Diagnosis Date  . Arthritis   . Depression   . Diabetes mellitus without  complication ([place])   . Family history of adverse reaction to anesthesia    My Mother had some complications,I dont remember what   . GERD (gastroesophageal reflux disease)   . Heart murmur   . Hypercholesterolemia   . Hypertension   . Obesity   . OSA (obstructive sleep apnea)   . TIA (transient ischemic attack) 2012    PAST SURGICAL HISTORY: Past Surgical History:  Procedure Laterality Date  . APPENDECTOMY    . CARPAL TUNNEL RELEASE    . CESAREAN SECTION    . CHOLECYSTECTOMY    . KNEE SURGERY    . NASAL SINUS SURGERY    . TONSILLECTOMY      FAMILY HISTORY: The patient family history includes Allergic rhinitis in her daughter, father, grandchild, grandchild, and mother; Asthma in her mother; Bronchitis in her daughter, grandchild, grandchild, and sister; CAD in her mother; Eczema in her grandchild and sister; Food Allergy in her daughter; Lupus in her mother; Urticaria in her grandchild.   SOCIAL HISTORY:  The patient  reports that she is a non-smoker but has been exposed to tobacco smoke. She has never used smokeless tobacco. She reports previous alcohol use. She reports that she does not use drugs.  MEDICATIONS: Current Outpatient Medications  Medication Instructions  . amLODipine (NORVASC) 5 mg, Oral, Daily  . aspirin EC 81 mg, Oral, Daily, Swallow whole.  . cetirizine (ZYRTEC) 10 MG tablet 1 tablet, Oral, Daily at bedtime  . cholecalciferol (VITAMIN D) 1,000 Units, Oral, Daily  . diphenhydrAMINE (BENADRYL) 25 mg, Oral, Every 8 hours  . EPINEPHrine (EPI-PEN) 0.3 mg, Intramuscular, Once PRN  .  gabapentin (NEURONTIN) 300 mg, Oral, 2 times daily  . glimepiride (AMARYL) 4 mg, Oral, 2 times daily  . losartan-hydrochlorothiazide (HYZAAR) 50-12.5 MG tablet 1 tablet, Oral, Daily  . meloxicam (MOBIC) 15 mg, Oral, As needed  . metFORMIN (GLUCOPHAGE) 1,000 mg, Oral, 2 times daily with meals  . metoprolol succinate (TOPROL XL) 12.5 mg, Oral, Daily  . nitroGLYCERIN (NITROSTAT) 0.4  MG SL tablet TAKE 1 TAB SUBLINGUALLY EVERY 5 MINUTES AS NEEDED  . omeprazole (PRILOSEC) 20 mg, Oral, Daily  . rosuvastatin (CRESTOR) 10 mg, Oral, Daily  . Trulicity 1.5 mg, Subcutaneous, Weekly    14 ORGAN REVIEW OF SYSTEMS: Review of Systems  Constitutional: Positive for malaise/fatigue. Negative for chills and fever.  HENT: Negative for hoarse voice and nosebleeds.   Eyes: Negative for discharge, double vision and pain.  Cardiovascular: Positive for chest pain. Negative for claudication, dyspnea on exertion, leg swelling, near-syncope, orthopnea, palpitations, paroxysmal nocturnal dyspnea and syncope.       Left Arm and jaw pain.   Respiratory: Positive for shortness of breath. Negative for hemoptysis.   Musculoskeletal: Negative for muscle cramps and myalgias.  Gastrointestinal: Positive for nausea. Negative for abdominal pain, constipation, diarrhea, hematemesis, hematochezia, melena and vomiting.  Neurological: Negative for dizziness and light-headedness.  All other systems reviewed and are negative.   PHYSICAL EXAM: Vitals with BMI 03/16/2021 03/16/2021 03/16/2021  Height - - -  Weight - - -  BMI - - -  Systolic 528 413 244  Diastolic 93 76 61  Pulse - - -     Intake/Output Summary (Last 24 hours) at 03/16/2021 0847 Last data filed at 03/16/2021 0018 Gross per 24 hour  Intake 12.5 ml  Output --  Net 12.5 ml    Net IO Since Admission: 12.5 mL [03/16/21 0847]  CONSTITUTIONAL: Well-developed and well-nourished. No acute distress.  SKIN: Skin is warm and dry. No rash noted. No cyanosis. No pallor. No jaundice HEAD: Normocephalic and atraumatic.  EYES: No scleral icterus MOUTH/THROAT: Moist oral membranes.  NECK: No JVD present. No thyromegaly noted. No carotid bruits  LYMPHATIC: No visible cervical adenopathy.  CHEST Normal respiratory effort. No intercostal retractions  LUNGS: Clear to auscultation bilaterally.  No stridor. No wheezes. No rales.  CARDIOVASCULAR: Regular  rate and rhythm, positive S1-S2, no murmurs rubs or gallops appreciated. ABDOMINAL: No apparent ascites.  EXTREMITIES: No peripheral edema  HEMATOLOGIC: No significant bruising NEUROLOGIC: Oriented to person, place, and time. Nonfocal. Normal muscle tone.  PSYCHIATRIC: Normal mood and affect. Normal behavior. Cooperative  RADIOLOGY: DG Chest 2 View  Result Date: 03/15/2021 CLINICAL DATA:  Chest pain, shortness of breath, weakness EXAM: CHEST - 2 VIEW COMPARISON:  09/21/2015 FINDINGS: Dominantly linear densities are noted in the lingula, likely scarring. Right lung clear. Heart is normal size. No effusions. No acute bony abnormality. IMPRESSION: Lingular scarring.  No active disease. Electronically Signed   By: Rolm Baptise M.D.   On: 03/15/2021 20:59    LABORATORY DATA: Lab Results  Component Value Date   WBC 9.9 03/16/2021   HGB 12.0 03/16/2021   HCT 37.4 03/16/2021   MCV 77.4 (L) 03/16/2021   PLT 287 03/16/2021    Recent Labs  Lab 03/16/21 0451  NA 140  K 3.7  CL 106  CO2 24  BUN 8  CREATININE 0.91  CALCIUM 8.8*  GLUCOSE 120*    Lipid Panel     Component Value Date/Time   CHOL 147 09/22/2015 0405   TRIG 200 (H) 09/22/2015 0405  HDL 34 (L) 09/22/2015 0405   CHOLHDL 4.3 09/22/2015 0405   VLDL 40 09/22/2015 0405   LDLCALC 73 09/22/2015 0405    BNP (last 3 results) No results for input(s): BNP in the last 8760 hours.  HEMOGLOBIN A1C Lab Results  Component Value Date   HGBA1C 7.3 (H) 03/16/2021   MPG 162.81 03/16/2021    Cardiac Panel (last 3 results) No results for input(s): CKTOTAL, CKMB, RELINDX in the last 8760 hours.  Invalid input(s): TROPONINHS  No results found for: CKTOTAL, CKMB, CKMBINDEX   TSH Recent Labs    03/16/21 0451  TSH 2.345      CARDIAC DATABASE: EKG: 03/14/2021: Normal sinus rhythm, 91 bpm, without underlying ischemia or injury pattern.    03/15/2021: Normal sinus rhythm, 95 bpm, low voltage, without underlying ischemia or  injury pattern.  Echocardiogram: 09/21/2015:  LVEF 60-65%, no regional wall motion abnormalities, grade 1 diastolic impairment, mildly dilated left atrium.  Stress Testing: No results found for this or any previous visit from the past 1095 days.   Heart Catheterization: 10 years ago with Dr. Hardie Lora back in [facility]   IMPRESSION & RECOMMENDATIONS: Shannon Rosario is a 60 y.o. African-American female whose past medical history and cardiovascular risk factors include:  hypertension, Hx of TIA, Non-insulin-dependent diabetes mellitus type 2, hyperlipidemia, obesity due to excess calories, family history of premature CAD, postmenopausal female.  Unstable angina: Patient presented to the office on 03/14/2021 for similar symptoms and the shared decision was to proceed with left heart catheterization.  She was scheduled for an elective heart catheterization on 03/28/2021.  However, she presents to the hospital with symptoms of unstable angina that did not resolve with sublingual nitroglycerin tablets.  Troponins are negative x2 and EKG does not show any underlying injury pattern.  Patient was a started on IV heparin drip overnight and continues to have minimal discomfort.  The shared decision was to proceed with left heart catheterization during this hospitalization to evaluate for obstructive CAD with possible PCI interventions if needed.  The procedure of left heart catheterization with possible intervention was explained to the patient in detail.  The indication, alternatives, risks and benefits were reviewed.  Complications include but not limited to bleeding, infection, vascular injury, stroke, myocardial infection, arrhythmia, kidney injury, radiation-related injury in the case of prolonged fluoroscopy use, emergency cardiac surgery, and death. The patient understands the risks of serious complication is 1-2 in 3500 with diagnostic cardiac cath and 1-2% or less with  angioplasty/stenting.  I have discussed in particular detail the possibility of acute renal failure after coronary angiography particularly if PCI is necessary.  I have described that it is possible patient may need short-term dialysis and the less likely may also require long-term dialysis depending on renal recovery.  I have asked for consent to proceed with coronary angiography understanding the risks of potentially needing dialysis, as well as the risks as stated above.   The patient voices understanding and provides verbal feedback and wishes to proceed with coronary angiography with possible PCI.  Educated her on the importance of improving her modifiable cardiovascular risk factors.  We will check 2D echocardiogram  We will check a fasting lipid profile  Continue aspirin, statin therapy, beta-blockers, and sublingual nitroglycerin tablets as needed.  Further recommendations to follow after the left heart catheterization.   Benign essential hypertension: Follow blood pressures.  Continue antihypertensive medications.  Hyperlipidemia: Continue statin therapy.  Check lipid profile  Non-insulin-dependent diabetes mellitus type 2: Most  recent hemoglobin A1c reviewed.  Continue her care of chronic comorbid conditions as per primary team.  Patient's questions and concerns were addressed to her satisfaction. She voices understanding of the instructions provided during this encounter.   This note was created using a voice recognition software as a result there may be grammatical errors inadvertently enclosed that do not reflect the nature of this encounter. Every attempt is made to correct such errors.  Mechele Claude [facility]  Pager: [phone] Office: [phone] 03/16/2021, 8:47 AM

## 2021-03-16 NOTE — Interval H&P Note (Signed)
History and Physical Interval Note:  03/16/2021 9:15 AM  Doylene Canard  has presented today for surgery, with the diagnosis of unstable angina.  The various methods of treatment have been discussed with the patient and family. After consideration of risks, benefits and other options for treatment, the patient has consented to  Procedure(s): LEFT HEART CATH AND CORONARY ANGIOGRAPHY (N/A) as a surgical intervention.  The patient's history has been reviewed, patient examined, no change in status, stable for surgery.  I have reviewed the patient's chart and labs.  Questions were answered to the patient's satisfaction.    2016 Appropriate Use Criteria for Coronary Revascularization in Patients With Acute Coronary Syndrome NSTEMI/UA Intermediate Risk (TIMI Score 3-4) NSTEMI/Unstable angina, stabilized patient at Intermediate Risk (TIMI Score 3-4) Link Here: http://dodson-rose.net/ Indication:  Revascularization by PCI or CABG of 1 or more arteries in a patient with NSTEMI or unstable angina with Stabilization after presentation Intermediate risk for clinical events  A (7) Indication: 16; Score 7   [place]

## 2021-03-16 NOTE — ED Notes (Signed)
Patient has sleep apneic  Her sats will drop to 83% when sleeping states she is suppose to be on cpap at night however doesn't wear it. Placed on 2 l/[place] and sats increased to 98% while sleeping.

## 2021-03-16 NOTE — Progress Notes (Signed)
[place] for Heparin Indication: chest pain/ACS  Allergies  Allergen Reactions  . Bee Venom Anaphylaxis  . Pineapple Swelling    Reports it makes her throat swell  . Dobutamine Hypertension    Patient Measurements:   Heparin Dosing Weight: 94.1 kg  Vital Signs: Temp: 98.3 F (36.8 C) (03/24 0344) Temp Source: Oral (03/24 0344) BP: 118/79 (03/24 0344) Pulse Rate: 76 (03/24 0344)  Labs: Recent Labs    03/15/21 2028 03/15/21 2237 03/16/21 0451  HGB 12.8  --  12.0  HCT 38.4  --  37.4  PLT 328  --  287  HEPARINUNFRC  --   --  0.55  CREATININE 0.85  --  0.91  TROPONINIHS 3 3  --     Estimated Creatinine Clearance: 91.3 mL/min (by C-G formula based on SCr of 0.91 mg/dL).   Assessment: 60 y.o. female with chest pain for heparin  Heparin level 0.3-0.7 units/ml Monitor platelets by anticoagulation protocol: Yes   Plan:  Continue Heparin at current rate   Kester Stimpson, [place] PharmD. BCPS  03/16/2021,5:42 AM

## 2021-03-16 NOTE — Discharge Instructions (Signed)
Radial Site Care  This sheet gives you information about how to care for yourself after your procedure. Your health care provider may also give you more specific instructions. If you have problems or questions, contact your health care provider. What can I expect after the procedure? After the procedure, it is common to have:  Bruising and tenderness at the catheter insertion area. Follow these instructions at home: Medicines  Take over-the-counter and prescription medicines only as told by your health care provider. Insertion site care  Follow instructions from your health care provider about how to take care of your insertion site. Make sure you: ? Wash your hands with soap and water before you change your bandage (dressing). If soap and water are not available, use hand sanitizer. ? Change your dressing as told by your health care provider. ? Leave stitches (sutures), skin glue, or adhesive strips in place. These skin closures may need to stay in place for 2 weeks or longer. If adhesive strip edges start to loosen and curl up, you may trim the loose edges. Do not remove adhesive strips completely unless your health care provider tells you to do that.  Check your insertion site every day for signs of infection. Check for: ? Redness, swelling, or pain. ? Fluid or blood. ? Pus or a bad smell. ? Warmth.  Do not take baths, swim, or use a hot tub until your health care provider approves.  You may shower 24-48 hours after the procedure, or as directed by your health care provider. ? Remove the dressing and gently wash the site with plain soap and water. ? Pat the area dry with a clean towel. ? Do not rub the site. That could cause bleeding.  Do not apply powder or lotion to the site. Activity  For 24 hours after the procedure, or as directed by your health care provider: ? Do not flex or bend the affected arm. ? Do not push or pull heavy objects with the affected arm. ? Do not drive  yourself home from the hospital or clinic. You may drive 24 hours after the procedure unless your health care provider tells you not to. ? Do not operate machinery or power tools.  Do not lift anything that is heavier than 10 lb (4.5 kg), or the limit that you are told, until your health care provider says that it is safe.  Ask your health care provider when it is okay to: ? Return to work or school. ? Resume usual physical activities or sports. ? Resume sexual activity.   General instructions  If the catheter site starts to bleed, raise your arm and put firm pressure on the site. If the bleeding does not stop, get help right away. This is a medical emergency.  If you went home on the same day as your procedure, a responsible adult should be with you for the first 24 hours after you arrive home.  Keep all follow-up visits as told by your health care provider. This is important. Contact a health care provider if:  You have a fever.  You have redness, swelling, or yellow drainage around your insertion site. Get help right away if:  You have unusual pain at the radial site.  The catheter insertion area swells very fast.  The insertion area is bleeding, and the bleeding does not stop when you hold steady pressure on the area.  Your arm or hand becomes pale, cool, tingly, or numb. These symptoms may represent a serious   problem that is an emergency. Do not wait to see if the symptoms will go away. Get medical help right away. Call your local emergency services (911 in the [place] Do not drive yourself to the hospital. Summary  After the procedure, it is common to have bruising and tenderness at the site.  Follow instructions from your health care provider about how to take care of your radial site wound. Check the wound every day for signs of infection.  Do not lift anything that is heavier than 10 lb (4.5 kg), or the limit that you are told, until your health care provider says that it  is safe. This information is not intended to replace advice given to you by your health care provider. Make sure you discuss any questions you have with your health care provider. Document Revised: 01/15/2018 Document Reviewed: 01/15/2018 Elsevier Patient Education  2021 [place].   Angina  Angina is discomfort or pain in the chest, neck, arm, jaw, or back. The discomfort is caused by a lack of blood in the middle layer of the heart wall. The middle layer of the heart wall is called the myocardium. What are the causes? This condition is caused by a buildup of fat and cholesterol, or plaque, in your arteries. This buildup narrows the arteries and makes it hard for blood to flow. What increases the risk? Main risks  High levels of cholesterol in your blood.  High blood pressure.  Diabetes.  Family history of heart disease.  Not exercising or moving enough.  Depression.  Having had radiation treatment on the left side of your chest. Other risks  Tobacco use.  Too much body weight (obesity).  A diet that is high in unhealthy fats (saturated fats).  Stress.  Using drugs, such as cocaine. Risks for women  Being older than 55 years.  Being in menopause. This is the time when a woman no longer has a menstrual period. What are the signs or symptoms? Symptoms in all people  Chest pain, which may: ? Feel like a crushing or squeezing in the chest. ? Feel like a tightness, pressure, fullness, or heaviness in the chest. ? Last for more than a few minutes at a time. ? Stop and come back (recur) after a few minutes.  Pain in the neck, arm, jaw, or back.  Heartburn or upset stomach (indigestion) for no reason.  Being short of breath.  Feeling like you may vomit (nauseous).  Sudden cold sweats. Symptoms in women and people with diabetes  Tiredness.  Worry and anxiety.  Weakness.  Dizziness or fainting. How is this treated? This condition may be treated  with:  Medicines. These can be given to prevent blood clots, stop a heart attack, lower blood pressure, or treat other risk factors.  A procedure to widen a narrowed or blocked artery in the heart.  Surgery to allow blood to go around a blocked artery. Follow these instructions at home: Medicines  Take over-the-counter and prescription medicines only as told by your doctor.  Do not take these medicines unless your doctor says that you can: ? NSAIDs. These include:  Ibuprofen.  Naproxen. ? Vitamin supplements that have vitamin A, vitamin E, or both. ? Hormone therapy that contains estrogen with or without progestin. Eating and drinking  Eat a heart-healthy diet that includes: ? Lots of fresh fruits and vegetables. ? Whole grains. ? Low-fat (lean) protein. ? Low-fat dairy products.  Follow instructions from your doctor about what you cannot eat  or drink.   Activity  Follow an exercise program that your doctor tells you.  Talk with your doctor about joining a program to make your heart strong again (cardiac rehab).  When you feel tired, take a break. Plan breaks if you know you are going to feel tired. Lifestyle  Do not smoke or use any products that contain nicotine or tobacco. If you need help quitting, ask your doctor.  If your doctor says you can drink alcohol: ? Limit how much you have to:  0-1 drink a day for women who are not pregnant.  0-2 drinks a day for men. ? Know how much alcohol is in your drink. In the [place] one drink equals one 12 oz bottle of beer (355 mL), one 5 oz glass of wine (148 mL), or one 1 oz glass of hard liquor (44 mL).   General instructions  Stay at a healthy weight. If told to lose weight, work with your doctor to do lose weight safely.  Learn to manage stress. If you need help, ask your doctor.  Keep your vaccines up to date. Get a flu shot every year.  Talk with your doctor if you feel sad. Take a screening test to see if you are  at risk for depression.  Work with your doctor to manage any other health problems that you have. These may include diabetes or high blood pressure.  Keep all follow-up visits. Get help right away if:  You have pain in your chest, neck, arm, jaw, or back, and the pain: ? Lasts more than a few minutes. ? Comes back. ? Does not get better after you take medicine under your tongue (sublingual nitroglycerin). ? Keeps getting worse. ? Comes more often.  You have any of these problems: ? Sweating a lot. ? Heartburn or upset stomach. ? Shortness of breath. ? Trouble breathing. ? Feeling like you may vomit. ? Vomiting. ? Feeling more tired than normal. ? Feeling nervous or worrying more than normal. ? Weakness.  You are dizzy or light-headed all of a sudden.  You faint. These symptoms may be an emergency. Get help right away. Call your local emergency services (911 in the [place]  Do not wait to see if the symptoms will go away.  Do not drive yourself to the hospital. Summary  Angina is discomfort or pain in the chest, neck, arm, neck, or back.  Symptoms include chest pain, heartburn or upset stomach, and shortness of breath.  Women or people with diabetes may have other symptoms, such as feeling nervous, being worried, or being weak or tired.  Take all medicines only as told by your doctor.  You should eat a heart-healthy diet and follow an exercise program. This information is not intended to replace advice given to you by your health care provider. Make sure you discuss any questions you have with your health care provider. Document Revised: 06/03/2020 Document Reviewed: 06/03/2020 Elsevier Patient Education  2021 [place].  Shannon Rosario,   You were admitted to the hospital for evaluation of chest pain. Your heart catheterization did show a small blockage in one of the arteries supplying blood to your heart; however, this did not require stenting, and cardiology  recommend continued medical management via medications.   Your blood pressure was stable without your home antihypertensive medications and you blood sugar was under great control without medication.   Upon discharge from the hospital, please:  - START taking lower dose of Toprol XL (12.5mg   or 1/2 tablet daily) - CONTINUE taking a baby aspirin (81mg ) 1 tablet daily  - CONTINUE taking your rosuvastatin 1 tablet daily and metformin twice daily  - CONTINUE taking nitroglycerin 1 tablet under the tongue every 5 minutes as needed for chest pain relief   Please STOP taking the following until you follow up with your primary care provider for further evaluation:  - Amlodipine - Hyzaar - Trulicity (may continue taking if you had been taking regularly at home without hypoglycemic symptoms)  - Glimepiride (may continue taking if you have been taking regularly at home without hypoglycemic symptoms)  Please be sure to attend your appointment with cardiologist Dr. Terri Skains at [facility] 04/06/21 @ 11:30am and please schedule a follow up appointment within the next 2 weeks with your primary care doctor for follow up of your medications.   Please return to the ED if you experience new or worsening chest pain, severe shortness of breath, new weakness, tingling, numbness or any other concerning symptoms.   Thank you and take care,  Dr. Konrad Penta

## 2021-03-16 NOTE — Discharge Summary (Signed)
Name: Shannon Rosario MRN: [id number] DOB: 05-Jul-1961 60 y.o. PCP: Benito Mccreedy, MD  Date of Admission: 03/15/2021  8:18 PM Date of Discharge: 03/16/2021 Attending Physician: Dr. Dareen Piano  Discharge Diagnosis: 1. Multiple Vessel CAD 2. Unstable Angina  3. Hypertension  4. Hyperlipidemia  5. Type II DM 6. OSA 7. Microcytosis  Discharge Medications: Allergies as of 03/16/2021      Reactions   Bee Venom Anaphylaxis   Pineapple Swelling   Reports it makes her throat swell   Dobutamine Hypertension      Medication List    STOP taking these medications   amLODipine 5 MG tablet Commonly known as: NORVASC   diphenhydrAMINE 25 MG tablet Commonly known as: BENADRYL   glimepiride 4 MG tablet Commonly known as: AMARYL   losartan-hydrochlorothiazide 50-12.5 MG tablet Commonly known as: HYZAAR   Trulicity 1.5 ZW/2.5EN Sopn Generic drug: Dulaglutide     TAKE these medications   aspirin EC 81 MG tablet Take 1 tablet (81 mg total) by mouth daily. Swallow whole.   cetirizine 10 MG tablet Commonly known as: ZYRTEC Take 1 tablet by mouth at bedtime.   cholecalciferol 1000 units tablet Commonly known as: VITAMIN D Take 1,000 Units by mouth daily.   EPINEPHrine 0.3 mg/0.3 mL Soaj injection Commonly known as: EPI-PEN Inject 0.3 mLs (0.3 mg total) into the muscle once as needed (anaphylaxis).   gabapentin 300 MG capsule Commonly known as: NEURONTIN Take 300 mg by mouth 2 (two) times daily.   meloxicam 15 MG tablet Commonly known as: MOBIC Take 15 mg by mouth as needed for pain.   metFORMIN 1000 MG tablet Commonly known as: GLUCOPHAGE Take 1,000 mg by mouth 2 (two) times daily with a meal.   metoprolol succinate 25 MG 24 hr tablet Commonly known as: Toprol XL Take 0.5 tablets (12.5 mg total) by mouth daily.   nitroGLYCERIN 0.4 MG SL tablet Commonly known as: NITROSTAT TAKE 1 TAB SUBLINGUALLY EVERY 5 MINUTES AS NEEDED   omeprazole 20 MG  capsule Commonly known as: PRILOSEC Take 20 mg by mouth daily.   rosuvastatin 10 MG tablet Commonly known as: CRESTOR Take 10 mg by mouth daily.       Disposition and follow-up:   Shannon Rosario was discharged from [facility] in Stable condition.  At the hospital follow up visit please address:  1.  Active Issues:  1. Multiple Vessel CAD - LHC showed an 80% stenosis of a LCx mid-eccentric OM2, although other coronary arteries showed non-obstructive CAD. LDL was within goal. Managed medically with ASA 81mg  and cardiology follow up appointment w/ Dr. Terri Skains 04/06/21 @ 11:30am.  2. Unstable Angina - Discharged with home NTG PRN. Continue to monitor symptoms. 3. Hypertension - Patient had not been taking home amlodipine, Hyzaar, or Toprol for the last 4 months. Blood pressure remained stable without medication in the hospital. Discharged with metoprolol 12.5mg  daily alone. Continue close monitoring with goal <120/80. 4. Type II DM - Home medication include glemipiride, metformin, and trulicity. Blood sugars were stable off all medications in the hospital. Patient was discharged on Metformin alone. Please follow up medications.  6. OSA - notes difficulty sleeping with home CPAP with frequent symptoms. Consider repeat sleep study.  7. Microcytosis w/o Anemia - Patient does not have a colonoscopy on chart review. Consider iron studies and colonoscopy screening.   2.  Labs / imaging needed at time of follow-up: CBC, iron studies, colonoscopy, sleep study  3.  Pending labs/ test needing  follow-up: None   Follow-up Appointments:  Follow-up Information    Tolia, Sunit, DO. Go on 04/06/2021.   Specialties: Cardiology, Radiology, Vascular Surgery Why: Please attend your apointment with Dr. Terri Skains @ 11:30am 04/06/21 or call to reschedule. Contact information: [place] [address] [phone]        Benito Mccreedy, MD. Schedule an  appointment as soon as possible for a visit.   Specialty: Internal Medicine Why: Please schedule a hospital follow up appointment with your PCP within the next 2 weeks. Contact information: [address] [place] [address] [phone]               Hospital Course by problem list:  1. Multiple Vessel CAD Please see attached LHC results for full report. Patient presented with left-sided chest pressure radiating down her left arm that worsened over the past week, consistent with unstable angina. Her troponins were unremarkable. Cardiology were consulted and LHC showed 80% mid-eccentric stenosis of a small eccentric OM2 off LCx. Her LM was normal and remaining vessels showed non-obstructive CAD. She was continued on ASA 81mg  daily and NTG PRN.  2. Hypertension - Patient had not been taking home amlodipine, Hyzaar, or Toprol for the last 4 months. Blood pressure remained stable without medication in the hospital. Discharged with metoprolol 12.5mg  daily alone. Continue close monitoring with goal <120/80.  3. Type II DM - Home medication include glemipiride, metformin, and trulicity. Her Hgb A1c was 7.3, although blood sugars were very well controlled here without any medications. Patient was discharged on Metformin alone and will need close outpatient follow up.   4. OSA - notes difficulty sleeping with home CPAP with frequent symptoms. She used CPAP without difficulty in the hospital. Will require outpatient follow up.  5. Microcytosis w/o Anemia - Patient does not have a colonoscopy on chart review. Her hemoglobin was normal here although she did have persistent microcytosis. Consider iron studies and colonoscopy screening outpatient.  Discharge Subjective:  Shannon Rosario says she has only minimal chest pressure that significantly improved since admission. She tolerated her catheterization without complications and is eager to return home. She denies SOB, cough,  abdominal pain, weakness, or any other complaints.   Discharge Exam:   BP (!) 135/92   Pulse 77   Temp 98.4 F (36.9 C) (Oral)   Resp 20   SpO2 100%  Discharge exam:  General: Patient is morbidly obese. Appears comfortable in NAD. Eyes: Sclera non-icteric. No conjunctival injection.  HENT: MMM. No nasal discharge. Respiratory: Lungs are CTA, bilaterally. No wheezes, rales, or rhonchi.  Cardiovascular: Regular rate and rhythm. No murmurs, rubs, or gallops. No lower extremity edema. Neurological: Alert and oriented x 3. Abdominal: Soft and non-tender to palpation. Bowel sounds intact. No rebound or guarding. Skin: No lesions. No rashes.  Psych: Normal affect. Normal tone of voice.   Pertinent Labs, Studies, and Procedures:   Labs: Troponin 3 HDL 37, Triglycerides 181 Hgb A1c 7.3  TSH 2.345 MCV 77.4, Hgb 12.0  LHC / Coronary Angiography 03/16/21 LM: Normal LAD: Prox 20%, mid 30% stenoses LCx: Small caliber OM2 with mid eccentric 80% stenosis RCA: Mid 20% disease LVEDP 11 mmHg  CXR 03/15/21 COMPARISON:  09/21/2015 FINDINGS: Dominantly linear densities are noted in the lingula, likely scarring. Right lung clear. Heart is normal size. No effusions. No acute bony abnormality. IMPRESSION: Lingular scarring.  No active disease. Electronically Signed   By: Rolm Baptise M.D.   On: 03/15/2021  20:59   Discharge Instructions: Discharge Instructions    Call MD for:  difficulty breathing, headache or visual disturbances   Complete by: As directed    Call MD for:  extreme fatigue   Complete by: As directed    Call MD for:  persistant dizziness or light-headedness   Complete by: As directed    Call MD for:  severe uncontrolled pain   Complete by: As directed    Diet - low sodium heart healthy   Complete by: As directed    Diet Carb Modified   Complete by: As directed    Increase activity slowly   Complete by: As directed       Shannon Rosario,   You were admitted to  the hospital for evaluation of chest pain. Your heart catheterization did show a small blockage in one of the arteries supplying blood to your heart; however, this did not require stenting, and cardiology recommend continued medical management via medications.   Your blood pressure was stable without your home antihypertensive medications and you blood sugar was under great control without medication.   Upon discharge from the hospital, please:  - START taking lower dose of Toprol XL (12.5mg  or 1/2 tablet daily) - CONTINUE taking a baby aspirin (81mg ) 1 tablet daily  - CONTINUE taking your rosuvastatin 1 tablet daily and metformin twice daily  - CONTINUE taking nitroglycerin 1 tablet under the tongue every 5 minutes as needed for chest pain relief   Please STOP taking the following until you follow up with your primary care provider for further evaluation:  - Amlodipine - Hyzaar - Trulicity (may continue taking if you had been taking regularly at home without hypoglycemic symptoms)  - Glimepiride (may continue taking if you have been taking regularly at home without hypoglycemic symptoms)  Please be sure to attend your appointment with cardiologist Dr. Terri Skains at [facility] 04/06/21 @ 11:30am and please schedule a follow up appointment within the next 2 weeks with your primary care doctor for follow up of your medications.   Please return to the ED if you experience new or worsening chest pain, severe shortness of breath, new weakness, tingling, numbness or any other concerning symptoms.   Thank you and take care,  Dr. Konrad Penta   Signed: Jeralyn Bennett, MD 03/16/2021, 4:52 PM   Pager: [phone]

## 2021-03-16 NOTE — Progress Notes (Signed)
PT Cancellation Note  Patient Details Name: Shannon Rosario MRN: [id number] DOB: 11/06/1961   Cancelled Treatment:    Reason Eval/Treat Not Completed: Patient at procedure or test/unavailable. LEFT HEART CATH AND CORONARY ANGIOGRAPHY  Lyanne Co, DPT Acute Rehabilitation Services [phone]   [place] 03/16/2021, 9:42 AM

## 2021-03-17 ENCOUNTER — Encounter (HOSPITAL_COMMUNITY): Payer: Self-pay | Admitting: Cardiology

## 2021-03-20 DIAGNOSIS — Z1231 Encounter for screening mammogram for malignant neoplasm of breast: Secondary | ICD-10-CM | POA: Diagnosis not present

## 2021-03-25 ENCOUNTER — Other Ambulatory Visit (HOSPITAL_COMMUNITY): Payer: Self-pay

## 2021-03-27 DIAGNOSIS — E1142 Type 2 diabetes mellitus with diabetic polyneuropathy: Secondary | ICD-10-CM | POA: Diagnosis not present

## 2021-03-27 DIAGNOSIS — E782 Mixed hyperlipidemia: Secondary | ICD-10-CM | POA: Diagnosis not present

## 2021-03-27 DIAGNOSIS — E1165 Type 2 diabetes mellitus with hyperglycemia: Secondary | ICD-10-CM | POA: Diagnosis not present

## 2021-03-27 DIAGNOSIS — I10 Essential (primary) hypertension: Secondary | ICD-10-CM | POA: Diagnosis not present

## 2021-03-28 ENCOUNTER — Ambulatory Visit (HOSPITAL_COMMUNITY): Admit: 2021-03-28 | Payer: Medicare Other | Admitting: Cardiology

## 2021-03-28 ENCOUNTER — Encounter (HOSPITAL_COMMUNITY): Payer: Self-pay

## 2021-03-28 SURGERY — LEFT HEART CATH AND CORONARY ANGIOGRAPHY
Anesthesia: LOCAL

## 2021-04-05 NOTE — Progress Notes (Signed)
Date:  04/06/2021   ID:  Shannon Rosario, DOB 25-Jul-1961, MRN [id number]  PCP:  Benito Mccreedy, MD  Cardiologist:  Rex Kras, DO, [facility] (established care 03/14/2021) Former Cardiology Providers: Dr. Hardie Lora ([place], [place])  Date: 04/06/21 Last Office Visit: 03/14/2021  Chief Complaint  Patient presents with  . Chest Pain  . Post cath   . Follow-up    HPI  Shannon Rosario is a 60 y.o. female who presents to the office with a chief complaint of " reevaluation of chest pain status post heart catheterization." Patient's past medical history and cardiovascular risk factors include: hypertension, Hx of TIA, Non-insulin-dependent diabetes mellitus type 2, hyperlipidemia, obesity due to excess calories, family history of premature CAD, postmenopausal female.  She is referred to the office at the request of Osei-Bonsu, Iona Beard, MD for evaluation of chest pain.  At the last office visit we discussed undergoing a left heart catheterization given her symptoms of angina pectoris.  However in the meantime she was having worsening symptoms and presented to the hospital sooner for unstable angina.  She underwent left heart catheterization and was noted to have nonobstructive CAD with 80% eccentric stenosis in the mid second obtuse marginal branch which is small caliber and size.  And therefore recommended to be treated medically.  Since hospitalization patient states that she has not had any reoccurrence of chest pain.  She is overall euvolemic and not in congestive heart failure.  Patient states that she is no longer drinking red bull and the decreasing caffeinated beverages has helped her symptoms overall.  Patient states that she is not tolerating her blood pressure medications well because as soon as she takes them couple hours later she feels lightheaded, dizzy with changing positions.  Therefore has not taken her antihypertensive medications for the last 2 days.  Patient does  not know exactly which medication she is taking but thinks she is on losartan/hydrochlorothiazide combination pill.  We discussed splitting the medication into 2 so that she can take the diuretic in the morning and losartan at night.  She will call the office with the medication dosage.  Patient states that she was diagnosed with sleep apnea in the past but is not using her CPAP machine due to not able to tolerate the mask.  But she is interested in getting reevaluated and addressing if she still has it.  She is asking for referral.  FUNCTIONAL STATUS: No structured exercise program or daily routine.   ALLERGIES: Allergies  Allergen Reactions  . Bee Venom Anaphylaxis  . Pineapple Swelling    Reports it makes her throat swell  . Dobutamine Hypertension    MEDICATION LIST PRIOR TO VISIT: Current Meds  Medication Sig  . aspirin EC 81 MG tablet Take 1 tablet (81 mg total) by mouth daily. Swallow whole.  . cholecalciferol (VITAMIN D) 1000 UNITS tablet Take 1,000 Units by mouth daily.  Marland Kitchen EPINEPHrine 0.3 mg/0.3 mL IJ SOAJ injection Inject 0.3 mLs (0.3 mg total) into the muscle once as needed (anaphylaxis).  Marland Kitchen gabapentin (NEURONTIN) 300 MG capsule Take 300 mg by mouth 2 (two) times daily.  . meloxicam (MOBIC) 15 MG tablet Take 15 mg by mouth as needed for pain.  . metFORMIN (GLUCOPHAGE) 1000 MG tablet Take 1,000 mg by mouth 2 (two) times daily with a meal.  . metoprolol succinate (TOPROL XL) 25 MG 24 hr tablet Take 0.5 tablets (12.5 mg total) by mouth daily.  . nitroGLYCERIN (NITROSTAT) 0.4 MG SL tablet TAKE 1  TAB SUBLINGUALLY EVERY 5 MINUTES AS NEEDED  . omeprazole (PRILOSEC) 20 MG capsule Take 20 mg by mouth daily.  . rosuvastatin (CRESTOR) 10 MG tablet Take 10 mg by mouth daily.     PAST MEDICAL HISTORY: Past Medical History:  Diagnosis Date  . Arthritis   . Depression   . Diabetes mellitus without complication ([place])   . Family history of adverse reaction to anesthesia    My Mother  had some complications,I dont remember what   . GERD (gastroesophageal reflux disease)   . Heart murmur   . Hypercholesterolemia   . Hypertension   . Obesity   . OSA (obstructive sleep apnea)   . TIA (transient ischemic attack) 2012    PAST SURGICAL HISTORY: Past Surgical History:  Procedure Laterality Date  . APPENDECTOMY    . CARPAL TUNNEL RELEASE    . CESAREAN SECTION    . CHOLECYSTECTOMY    . KNEE SURGERY    . LEFT HEART CATH AND CORONARY ANGIOGRAPHY N/A 03/16/2021   Procedure: LEFT HEART CATH AND CORONARY ANGIOGRAPHY;  Surgeon: Nigel Mormon, MD;  Location: [facility];  Service: Cardiovascular;  Laterality: N/A;  . NASAL SINUS SURGERY    . TONSILLECTOMY      FAMILY HISTORY: The patient family history includes Allergic rhinitis in her daughter, father, grandchild, grandchild, and mother; Asthma in her mother; Bronchitis in her daughter, grandchild, grandchild, and sister; CAD in her mother; Eczema in her grandchild and sister; Food Allergy in her daughter; Lupus in her mother; Urticaria in her grandchild.  SOCIAL HISTORY:  The patient  reports that she is a non-smoker but has been exposed to tobacco smoke. She has never used smokeless tobacco. She reports previous alcohol use. She reports that she does not use drugs.  REVIEW OF SYSTEMS: Review of Systems  Constitutional: Positive for malaise/fatigue. Negative for chills and fever.  HENT: Negative for hoarse voice and nosebleeds.   Eyes: Negative for discharge, double vision and pain.  Cardiovascular: Negative for chest pain, claudication, dyspnea on exertion, leg swelling, near-syncope, orthopnea, palpitations, paroxysmal nocturnal dyspnea and syncope.  Respiratory: Negative for hemoptysis and shortness of breath.   Musculoskeletal: Negative for muscle cramps and myalgias.  Gastrointestinal: Negative for abdominal pain, constipation, diarrhea, hematemesis, hematochezia, melena, nausea and vomiting.   Neurological: Positive for dizziness. Negative for light-headedness.   PHYSICAL EXAM: Vitals with BMI 04/06/2021 04/06/2021 03/16/2021  Height - 5' 9"  -  Weight - 265 lbs -  BMI - 93.71 -  Systolic 696 789 381  Diastolic 96 97 92  Pulse 99 96 77   CONSTITUTIONAL: Well-developed and well-nourished. No acute distress.  SKIN: Skin is warm and dry. No rash noted. No cyanosis. No pallor. No jaundice HEAD: Normocephalic and atraumatic.  EYES: No scleral icterus MOUTH/THROAT: Moist oral membranes.  NECK: No JVD present. No thyromegaly noted. No carotid bruits  LYMPHATIC: No visible cervical adenopathy.  CHEST Normal respiratory effort. No intercostal retractions  LUNGS: Clear to auscultation bilaterally.  No stridor. No wheezes. No rales.  CARDIOVASCULAR: Regular rate and rhythm, positive S1-S2, no murmurs rubs or gallops appreciated. ABDOMINAL: No apparent ascites.  EXTREMITIES: No peripheral edema  HEMATOLOGIC: No significant bruising NEUROLOGIC: Oriented to person, place, and time. Nonfocal. Normal muscle tone.  PSYCHIATRIC: Normal mood and affect. Normal behavior. Cooperative  CARDIAC DATABASE: EKG: 03/16/2021:Normal sinus rhythm. Rightward axis. Low voltage QRS. Cannot rule out anterior infarct, age undetermined. Abnormal ECG.      Echocardiogram: 09/21/2015:  LVEF 60-65%,  no regional wall motion abnormalities, grade 1 diastolic impairment, mildly dilated left atrium.  Stress Testing: No results found for this or any previous visit from the past 1095 days.  Heart Catheterization: 03/16/2021:  LM: Normal LAD: Prox 20%, mid 30% stenoses LCx: Small caliber OM2 with mid eccentric 80% stenosis RCA: Mid 20% disease LVEDP 11 mmHg   LABORATORY DATA: CBC Latest Ref Rng & Units 03/16/2021 03/15/2021 09/30/2016  WBC 4.0 - 10.5 K/uL 9.9 8.8 8.1  Hemoglobin 12.0 - 15.0 g/dL 12.0 12.8 12.3  Hematocrit 36.0 - 46.0 % 37.4 38.4 37.4  Platelets 150 - 400 K/uL 287 328 243    CMP Latest  Ref Rng & Units 03/16/2021 03/15/2021 09/30/2016  Glucose 70 - 99 mg/dL 120(H) 122(H) 278(H)  BUN 6 - 20 mg/dL 8 9 11   Creatinine 0.44 - 1.00 mg/dL 0.91 0.85 0.83  Sodium 135 - 145 mmol/L 140 140 139  Potassium 3.5 - 5.1 mmol/L 3.7 3.7 3.3(L)  Chloride 98 - 111 mmol/L 106 108 106  CO2 22 - 32 mmol/L 24 24 25   Calcium 8.9 - 10.3 mg/dL 8.8(L) 8.9 8.8(L)  Total Protein 6.5 - 8.1 g/dL - - 6.5  Total Bilirubin 0.3 - 1.2 mg/dL - - 0.4  Alkaline Phos 38 - 126 U/L - - 57  AST 15 - 41 U/L - - 19  ALT 14 - 54 U/L - - 30    Lipid Panel     Component Value Date/Time   CHOL 121 03/16/2021 0854   TRIG 181 (H) 03/16/2021 0854   HDL 37 (L) 03/16/2021 0854   CHOLHDL 3.3 03/16/2021 0854   VLDL 36 03/16/2021 0854   LDLCALC 48 03/16/2021 0854    No components found for: NTPROBNP No results for input(s): PROBNP in the last 8760 hours. Recent Labs    03/16/21 0451  TSH 2.345    BMP Recent Labs    03/15/21 2028 03/16/21 0451  NA 140 140  K 3.7 3.7  CL 108 106  CO2 24 24  GLUCOSE 122* 120*  BUN 9 8  CREATININE 0.85 0.91  CALCIUM 8.9 8.8*  GFRNONAA >60 >60    HEMOGLOBIN A1C Lab Results  Component Value Date   HGBA1C 7.3 (H) 03/16/2021   MPG 162.81 03/16/2021   External Labs: Collected: 02/27/2021 Creatinine 0.89 mg/dL. eGFR: 82 mL/min per 1.73 m Potassium 4.2. AST 20, ALT 26, alkaline phosphatase 72 Hemoglobin 13.5 g/dL, hematocrit 42% Troponin I 13 Hemoglobin A1c: 7.1  Lipid profile: Collected: 06/02/2019 Total cholesterol 145, triglycerides 328, HDL 34, LDL 45  IMPRESSION:    ICD-10-CM   1. Nonobstructive atherosclerosis of coronary artery  I25.10 Ambulatory referral to Neurology  2. Essential hypertension  I10 Ambulatory referral to Neurology  3. Type 2 diabetes mellitus with hyperglycemia, without long-term current use of insulin (HCC)  E11.65   4. Mixed hyperlipidemia  E78.2   5. Hx-TIA (transient ischemic attack)  Z86.73   6. Class 2 severe obesity due to excess  calories with serious comorbidity and body mass index (BMI) of 39.0 to 39.9 in adult [facility])  E66.01    Z68.39      RECOMMENDATIONS: Che Below is a 59 y.o. female whose past medical history and cardiac risk factors include: Nonobstructive CAD, hypertension, Hx of TIA, Non-insulin-dependent diabetes mellitus type 2, hyperlipidemia, obesity due to excess calories, family history of premature CAD, postmenopausal female.  Nonobstructive CAD: Patient has undergone left heart catheterization since last office visit. Results reviewed with her at today's  encounter and noted above for further reference. She has not required the use of sublingual nitroglycerin tablet. She is currently on metoprolol but is holding off on other antihypertensive medications due to lightheaded and dizziness with changing positions thereafter. Patient is asked to call the office to reconcile her medications and I would recommend splitting the combination pill into 2 separate pills so she can take 1 in the morning and 1 in the evening. Recommended to have the echocardiogram performed. We will refer her to Dr. Brett Fairy for sleep apnea evaluation. Educated on the importance of improving her modifiable cardiovascular risk factors.  Benign essential hypertension: Office blood pressure is not within goal.  See above She is going to call the office for medication reconciliation. Low-salt diet recommended. Currently managed by primary care provider.  Non-insulin-dependent diabetes mellitus type 2: Most recent hemoglobin A1c reviewed.  Patient educated on the importance of glycemic control.  Currently managed by primary care provider.  FINAL MEDICATION LIST END OF ENCOUNTER: No orders of the defined types were placed in this encounter.     Current Outpatient Medications:  .  aspirin EC 81 MG tablet, Take 1 tablet (81 mg total) by mouth daily. Swallow whole., Disp: 90 tablet, Rfl: 3 .  cholecalciferol (VITAMIN D) 1000  UNITS tablet, Take 1,000 Units by mouth daily., Disp: , Rfl:  .  EPINEPHrine 0.3 mg/0.3 mL IJ SOAJ injection, Inject 0.3 mLs (0.3 mg total) into the muscle once as needed (anaphylaxis)., Disp: 1 Device, Rfl: 0 .  gabapentin (NEURONTIN) 300 MG capsule, Take 300 mg by mouth 2 (two) times daily., Disp: , Rfl:  .  meloxicam (MOBIC) 15 MG tablet, Take 15 mg by mouth as needed for pain., Disp: , Rfl:  .  metFORMIN (GLUCOPHAGE) 1000 MG tablet, Take 1,000 mg by mouth 2 (two) times daily with a meal., Disp: , Rfl:  .  metoprolol succinate (TOPROL XL) 25 MG 24 hr tablet, Take 0.5 tablets (12.5 mg total) by mouth daily., Disp: 15 tablet, Rfl: 0 .  nitroGLYCERIN (NITROSTAT) 0.4 MG SL tablet, TAKE 1 TAB SUBLINGUALLY EVERY 5 MINUTES AS NEEDED, Disp: , Rfl:  .  omeprazole (PRILOSEC) 20 MG capsule, Take 20 mg by mouth daily., Disp: , Rfl:  .  rosuvastatin (CRESTOR) 10 MG tablet, Take 10 mg by mouth daily., Disp: , Rfl:  .  cetirizine (ZYRTEC) 10 MG tablet, Take 1 tablet by mouth at bedtime., Disp: , Rfl:   Orders Placed This Encounter  Procedures  . Ambulatory referral to Neurology    There are no Patient Instructions on file for this visit.   --Continue cardiac medications as reconciled in final medication list. --Return in about 6 months (around 10/06/2021). Or sooner if needed. --Continue follow-up with your primary care physician regarding the management of your other chronic comorbid conditions.  Patient's questions and concerns were addressed to her satisfaction. She voices understanding of the instructions provided during this encounter.   This note was created using a voice recognition software as a result there may be grammatical errors inadvertently enclosed that do not reflect the nature of this encounter. Every attempt is made to correct such errors.  Rex Kras, [place], [facility]  Pager: [phone] Office: [phone]

## 2021-04-06 ENCOUNTER — Ambulatory Visit: Payer: Medicare Other | Admitting: Cardiology

## 2021-04-06 ENCOUNTER — Other Ambulatory Visit: Payer: Self-pay

## 2021-04-06 ENCOUNTER — Encounter: Payer: Self-pay | Admitting: Cardiology

## 2021-04-06 VITALS — BP 142/96 | HR 99 | Temp 98.0°F | Resp 16 | Ht 69.0 in | Wt 265.0 lb

## 2021-04-06 DIAGNOSIS — I10 Essential (primary) hypertension: Secondary | ICD-10-CM | POA: Diagnosis not present

## 2021-04-06 DIAGNOSIS — E782 Mixed hyperlipidemia: Secondary | ICD-10-CM

## 2021-04-06 DIAGNOSIS — I2510 Atherosclerotic heart disease of native coronary artery without angina pectoris: Secondary | ICD-10-CM

## 2021-04-06 DIAGNOSIS — Z8673 Personal history of transient ischemic attack (TIA), and cerebral infarction without residual deficits: Secondary | ICD-10-CM

## 2021-04-06 DIAGNOSIS — E1165 Type 2 diabetes mellitus with hyperglycemia: Secondary | ICD-10-CM | POA: Diagnosis not present

## 2021-04-06 DIAGNOSIS — Z6839 Body mass index (BMI) 39.0-39.9, adult: Secondary | ICD-10-CM

## 2021-04-06 DIAGNOSIS — I209 Angina pectoris, unspecified: Secondary | ICD-10-CM

## 2021-04-07 ENCOUNTER — Other Ambulatory Visit: Payer: Self-pay | Admitting: Cardiology

## 2021-04-07 DIAGNOSIS — I209 Angina pectoris, unspecified: Secondary | ICD-10-CM

## 2021-04-10 ENCOUNTER — Other Ambulatory Visit: Payer: Self-pay

## 2021-04-10 ENCOUNTER — Ambulatory Visit: Payer: Medicare Other

## 2021-04-10 DIAGNOSIS — I209 Angina pectoris, unspecified: Secondary | ICD-10-CM

## 2021-04-10 DIAGNOSIS — E1165 Type 2 diabetes mellitus with hyperglycemia: Secondary | ICD-10-CM | POA: Diagnosis not present

## 2021-04-10 DIAGNOSIS — E1142 Type 2 diabetes mellitus with diabetic polyneuropathy: Secondary | ICD-10-CM | POA: Diagnosis not present

## 2021-04-10 DIAGNOSIS — I10 Essential (primary) hypertension: Secondary | ICD-10-CM | POA: Diagnosis not present

## 2021-04-10 DIAGNOSIS — E782 Mixed hyperlipidemia: Secondary | ICD-10-CM | POA: Diagnosis not present

## 2021-04-19 NOTE — Progress Notes (Signed)
Patient didn't answer left a vm to call back

## 2021-04-20 ENCOUNTER — Telehealth: Payer: Self-pay

## 2021-04-20 NOTE — Telephone Encounter (Signed)
-----   Message from Leonore, [place] sent at 04/19/2021  3:15 PM EDT ----- The left ventricular ejection fraction or pumping activity of the heart is within the normal limit. Details will be reviewed at the next office visit.

## 2021-04-28 ENCOUNTER — Ambulatory Visit: Payer: Medicare Other | Admitting: Cardiology

## 2021-06-01 ENCOUNTER — Ambulatory Visit (INDEPENDENT_AMBULATORY_CARE_PROVIDER_SITE_OTHER): Payer: Medicare Other | Admitting: Neurology

## 2021-06-01 ENCOUNTER — Encounter: Payer: Self-pay | Admitting: Neurology

## 2021-06-01 VITALS — BP 121/88 | HR 95 | Ht 68.0 in | Wt 264.0 lb

## 2021-06-01 DIAGNOSIS — I2510 Atherosclerotic heart disease of native coronary artery without angina pectoris: Secondary | ICD-10-CM

## 2021-06-01 DIAGNOSIS — G4719 Other hypersomnia: Secondary | ICD-10-CM | POA: Diagnosis not present

## 2021-06-01 DIAGNOSIS — G478 Other sleep disorders: Secondary | ICD-10-CM | POA: Diagnosis not present

## 2021-06-01 DIAGNOSIS — G4733 Obstructive sleep apnea (adult) (pediatric): Secondary | ICD-10-CM | POA: Diagnosis not present

## 2021-06-01 DIAGNOSIS — I200 Unstable angina: Secondary | ICD-10-CM

## 2021-06-01 DIAGNOSIS — Z6841 Body Mass Index (BMI) 40.0 and over, adult: Secondary | ICD-10-CM

## 2021-06-01 NOTE — Progress Notes (Signed)
SLEEP MEDICINE CLINIC    Provider:  Larey Seat, MD  Primary Care Physician:  Benito Mccreedy, MD [address] [place] [address]     Referring Provider: Rex Kras, DO Cardiologist         Chief Complaint according to patient   Patient presents with:     New Patient (Initial Visit)     Patient here for sleep consult. She has never used her CPAP but she did bring it with her. She stated she didn't care for it at the sleep study and couldn'tkeep it on. She has had it the machine since 2015.   She has not used CPAP.       HISTORY OF PRESENT ILLNESS:  Shannon Rosario is a 60 -year old African American female patient seen here as a referral on 06/01/2021 from Cardiologist Dr. Terri Skains for a sleep consultation.  Chief concern according to patient :    I have the pleasure of seeing Shannon Rosario on 05-31-2021 a right-handed 60 or [place] American female with a known OSA sleep disorder.  She has a past medical history of Anxiety, Arthritis, Depression, Diabetes mellitus without complication ([place]), Family history of adverse reaction to anesthesia, Fibromyalgia, GERD (gastroesophageal reflux disease), Heart murmur, Hypercholesterolemia, Hypertension, MORBID Obesity_ BMI of 40 plus OSA (obstructive sleep apnea), and TIA (transient ischemic attack) in the frame of a doputamine stress test(12/24/2010). She reports angina pectoris, see echo and cardiac cath and stress test results.     The patient had the first sleep study in the year 2006 in [place] and one in [place], [place], no results were available.  CPAP was recommended, and prescribed but she never used it at home.   She sleeps only 4-5 hours at night and considers this normal. But her sleep is fragmented. Her husband noted snoring and apnea.     Sleep relevant medical history: Nocturia  once or twice- Tonsillectomy at age 38 , whiplash, DDD, cervical spine , deviated septum repair and sinus surgery  in 1992.    Family medical /sleep history: sister on CPAP with OSA, has Lupus, had PE.   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  Pets are not present. Tobacco use; none.  ETOH use ;none,  Caffeine intake in form of Soda( 2 a day  ) and RED BULL  energy drinks. Regular exercise in form of nothing.   Hobbies :TV      Sleep habits are as follows: The patient's dinner time is between 5 PM. The patient goes to bed at 11.30 PM and goes to sleep quickly, has already slept on the couch- continues to sleep for 3-5 hours, wakes for one bathroom break. The preferred sleep position is supine , with the support of 1-2 pillows. Dreams are reportedly rare.   6-7 AM is the usual rise time. The patient wakes up spontaneously/ at 5.30    She reports not feeling refreshed or restored in AM, with symptoms such as dry mouth , stiffness, residual fatigue. Naps are taken infrequently, lasting from 15 to 30 minutes and are refreshing     .   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 No proximal critical stenoses seen. Patient she is ruled out for MI with HS trop (3,3), and she has not started recommended anti-anginal therapy with metoprolol xL. I recommend aggressive medical management with anti anginal therapy, Aspirin, high intensity statin. If her symptoms persist in spite of optimal medical therapy, will consider PCI to [place], MD Pager: [phone] Office: [phone]      Echocardiogram 04/10/2021:  Left ventricle cavity is normal in size and wall thickness. Normal global  wall motion. Normal LV systolic function with EF 66%. Normal diastolic  filling pattern.  Right ventricle cavity  is mildly dilated. Normal right ventricular  function.  Mild tricuspid regurgitation. Estimated pulmonary artery systolic pressure  24 mmHg. Review of Systems: Out of a complete 14 system review, the patient complains of only the following symptoms, and all other reviewed systems are negative.:  Fatigue, sleepiness , snoring, fragmented sleep, Insomnia - goes to watch TV in the middle of the night.    How likely are you to doze in the following situations: 0 = not likely, 1 = slight chance, 2 = moderate chance, 3 = high chance   Sitting and Reading? Watching Television? Sitting inactive in a public place (theater or meeting)? As a passenger in a car for an hour without a break? Lying down in the afternoon when circumstances permit? Sitting and talking to someone? Sitting quietly after lunch without alcohol? In a car, while stopped for a few minutes in traffic?   Total = 10/ 24 points   FSS endorsed at 0/ 63 points.   Social History   Socioeconomic History   Marital status: Married    Spouse name: Not on file   Number of children: 2   Years of education: Not on file   Highest education level: Bachelor's degree (e.g., BA, AB, BS)  Occupational History   Occupation: Pharmacist, hospital  Tobacco Use   Smoking status: Never    Passive exposure: Yes   Smokeless tobacco: Never  Vaping Use   Vaping Use: Never used  Substance and Sexual Activity   Alcohol use: Not Currently    Comment: "very little"   Drug use: Never   Sexual activity: Yes    Birth control/protection: None  Other Topics Concern   Not on file  Social History Narrative   Lives at home with husband and 2 granddaughters   Caffeine: 12 oz daily    Social Determinants of Health   Financial Resource Strain: Not on file  Food Insecurity: Not on file  Transportation Needs: Not on file  Physical Activity: Not on file  Stress: Not on file  Social Connections: Not on file    Family History  Problem Relation Age of Onset    Other Mother    CAD Mother    Asthma Mother    Allergic rhinitis Mother    Lupus Mother/ sister     Dementia Mother    Hypertension Mother    Diabetes Mother    Stroke Mother    Allergic rhinitis Father    Dementia Father    Eczema Sister    Bronchitis Sister    Sleep apnea Sister    Allergic rhinitis Daughter    Food Allergy Daughter    Bronchitis Daughter    Allergic rhinitis Grandchild    Eczema Grandchild    Urticaria Grandchild    Bronchitis Grandchild    Allergic rhinitis Grandchild    Bronchitis Grandchild  Sleep apnea Cousin        "most of my cousins on my father's side"   Angioedema Neg Hx    Immunodeficiency Neg Hx    Atopy Neg Hx     Past Medical History:  Diagnosis Date   Anxiety    Arthritis    Depression    Diabetes mellitus without complication ([place])    Family history of adverse reaction to anesthesia    My Mother had some complications,I dont remember what    Fibromyalgia    GERD (gastroesophageal reflux disease)    Heart murmur    Hypercholesterolemia    Hypertension    Obesity    OSA (obstructive sleep apnea)    TIA (transient ischemic attack) 12/24/2010    Past Surgical History:  Procedure Laterality Date   APPENDECTOMY     CARPAL TUNNEL RELEASE  [place] Left 1995   LEFT HEART CATH AND CORONARY ANGIOGRAPHY N/A 03/16/2021   Procedure: LEFT HEART CATH AND CORONARY ANGIOGRAPHY;  Surgeon: Nigel Mormon, MD;  Location: [facility];  Service: Cardiovascular;  Laterality: N/A;   [place]     Current Outpatient Medications on File Prior to Visit  Medication Sig Dispense Refill   amLODipine (NORVASC) 10 MG tablet Take 1 tablet by mouth daily.     aspirin 325 MG tablet Take 325 mg by mouth daily.     cholecalciferol (VITAMIN D) 1000 UNITS tablet Take 1,000 Units by mouth daily.     cycloSPORINE (RESTASIS) 0.05 % ophthalmic  emulsion 2 (two) times daily.     EPINEPHrine 0.3 mg/0.3 mL IJ SOAJ injection Inject 0.3 mLs (0.3 mg total) into the muscle once as needed (anaphylaxis). 1 Device 0   gabapentin (NEURONTIN) 300 MG capsule Take 300 mg by mouth 2 (two) times daily as needed.     glimepiride (AMARYL) 4 MG tablet Take 4 mg by mouth in the morning and at bedtime.     levocetirizine (XYZAL) 5 MG tablet Take 5 mg by mouth daily.     losartan-hydrochlorothiazide (HYZAAR) 50-12.5 MG tablet Take 1 tablet by mouth daily.     meloxicam (MOBIC) 15 MG tablet Take 15 mg by mouth as needed for pain.     metFORMIN (GLUCOPHAGE) 500 MG tablet Take 2 tablets by mouth 2 (two) times daily.     nitroGLYCERIN (NITROSTAT) 0.4 MG SL tablet TAKE 1 TAB SUBLINGUALLY EVERY 5 MINUTES AS NEEDED     omeprazole (PRILOSEC) 20 MG capsule Take 20 mg by mouth daily.     TRULICITY 1.5 HB/7.1IR SOPN Inject 1.5 mg into the skin once a week.     metoprolol succinate (TOPROL XL) 25 MG 24 hr tablet Take 0.5 tablets (12.5 mg total) by mouth daily. 15 tablet 0   rosuvastatin (CRESTOR) 10 MG tablet Take 10 mg by mouth daily. (Patient not taking: Reported on 06/01/2021)     No current facility-administered medications on file prior to visit.    Allergies  Allergen Reactions   Bee Venom Anaphylaxis   Pineapple Swelling    Reports it makes her throat swell   Dobutamine Hypertension    Physical exam:  Today's Vitals   06/01/21 1010  BP: 121/88  Pulse: 95  Weight: 264 lb (119.7 kg)  Height: 5\' 8"  (1.727 m)   Body mass index is 40.14 kg/m.   Wt  Readings from Last 3 Encounters:  06/01/21 264 lb (119.7 kg)  04/06/21 265 lb (120.2 kg)  03/14/21 266 lb (120.7 kg)     Ht Readings from Last 3 Encounters:  06/01/21 5\' 8"  (1.727 m)  04/06/21 5\' 9"  (1.753 m)  03/14/21 5\' 9"  (1.753 m)      General: The patient is awake, alert and appears not in acute distress. The patient is well groomed. Head: Normocephalic, atraumatic. Neck is supple. Mallampati  2,  neck circumference:16.5  inches . Nasal airflow restricted / barely patent.   Retrognathia is braces/ retainers-  seen.  Dental status:  Cardiovascular:  Regular rate and cardiac rhythm by pulse,  without distended neck veins. Respiratory: Lungs are clear to auscultation.  Skin:  Without evidence of ankle edema, or rash. Trunk: The patient's posture is erect.   Neurologic exam : The patient is awake and alert, oriented to place and time.   Memory subjective described as intact.  Attention span & concentration ability appears normal.  Speech is fluent,  without  dysarthria, dysphonia or aphasia.  Mood and affect are appropriate.   Cranial nerves: no loss of smell or taste reported  Pupils are equal and briskly reactive to light. Funduscopic exam deferred..  Extraocular movements in vertical and horizontal planes were intact and without nystagmus. No Diplopia. Visual fields by finger perimetry are intact. Hearing was intact to soft voice and finger rubbing.    Facial sensation intact to fine touch.  Facial motor strength is symmetric and tongue and uvula move midline.  Neck ROM : rotation, tilt and flexion extension were normal for age and shoulder shrug was symmetrical.    Motor exam:  Symmetric bulk, tone and ROM.   Normal tone without cog wheeling, symmetric grip strength . Here si a lower level of strength than expected.    Sensory:  Fine touch, vibration were normal.  Proprioception tested in the upper extremities was normal.   Coordination: Rapid alternating movements in the fingers/hands were of normal speed.  The Finger-to-nose maneuver was intact without evidence of ataxia, dysmetria or tremor.   Gait and station: Patient could rise unassisted from a seated position, walked without assistive device.  Stance is of normal width/ base and the patient turned with 4 steps. Left hip pain, and arthralgic gait. Limp.  Toe and heel walk were deferred.  Deep tendon reflexes: in  the  upper and lower extremities are symmetric and intact.  Babinski response was deferred,       After spending a total time of  35  minutes face to face and additional time for physical and neurologic examination, review of laboratory studies,  personal review of imaging studies, reports and results of other testing and review of referral information / records as far as provided in visit, I have established the following assessments:  1) Mrs. Shannon Rosario is a 60 year old female with a history of obstructive sleep apnea diagnosed more than a decade ago but she lived in [place] and later in [place].  She has never liked CPAP she brought her machine here to [place] but there were no data available from it.  One of her problems was that she removed the mask during the night and she has never gotten familiar visit or acclimatized.  Given her reluctance to be reevaluated for possible CPAP use I would suggest that we do a sleep study at home to screen for sleep apnea.  Her BMI at this time would not allow inspire as  an alternative therapy to CPAP the BMI is limited to 32 or below.  It is also possible that she has REM sleep dependent apnea or a significant amount of hypoxemia and in this case inspire or dental devices will not help at all.  I would be very glad to try the least invasive interface with her and see if she actually would like maybe BiPAP better than CPAP.  She had been given a nasal pillow.    Cardiac CAD, angina pectoris, severe obesity, TIA.     My Plan is to proceed with:  1)  HST or in lab- I would prefer a SPLIT night study,  2)  hypoxemia? 3) family history of PE, strokes, DVT.   I would like to thank  for allowing me to meet with and to take care of this pleasant patient.   In short, Judie Hollick is presenting with untreated OSA , I plan to follow up either personally or through our NP within 2-4 month.   CC: I will share my notes with cardiologist Dr. Terri Skains.  .  Electronically signed by: Larey Seat, MD 06/01/2021 10:36 AM  [facility] [employer] Board certified by The [employer] of Sleep Medicine and Diplomate of the [employer] of Sleep Medicine. Board certified In Neurology through the [place], Fellow of the [employer] of Neurology. Medical Director of [employer].

## 2021-06-01 NOTE — Patient Instructions (Signed)

## 2021-07-01 DIAGNOSIS — T22132A Burn of first degree of left upper arm, initial encounter: Secondary | ICD-10-CM | POA: Diagnosis not present

## 2021-07-01 DIAGNOSIS — T24231A Burn of second degree of right lower leg, initial encounter: Secondary | ICD-10-CM | POA: Diagnosis not present

## 2021-07-01 DIAGNOSIS — T2642XA Burn of left eye and adnexa, part unspecified, initial encounter: Secondary | ICD-10-CM | POA: Diagnosis not present

## 2021-07-01 DIAGNOSIS — S0502XA Injury of conjunctiva and corneal abrasion without foreign body, left eye, initial encounter: Secondary | ICD-10-CM | POA: Diagnosis not present

## 2021-07-06 DIAGNOSIS — S0502XA Injury of conjunctiva and corneal abrasion without foreign body, left eye, initial encounter: Secondary | ICD-10-CM | POA: Diagnosis not present

## 2021-07-06 DIAGNOSIS — H2513 Age-related nuclear cataract, bilateral: Secondary | ICD-10-CM | POA: Diagnosis not present

## 2021-07-06 DIAGNOSIS — H04123 Dry eye syndrome of bilateral lacrimal glands: Secondary | ICD-10-CM | POA: Diagnosis not present

## 2021-07-13 DIAGNOSIS — H04123 Dry eye syndrome of bilateral lacrimal glands: Secondary | ICD-10-CM | POA: Diagnosis not present

## 2021-07-13 DIAGNOSIS — H43392 Other vitreous opacities, left eye: Secondary | ICD-10-CM | POA: Diagnosis not present

## 2021-07-13 DIAGNOSIS — H2513 Age-related nuclear cataract, bilateral: Secondary | ICD-10-CM | POA: Diagnosis not present

## 2021-08-02 ENCOUNTER — Other Ambulatory Visit: Payer: Self-pay

## 2021-08-02 ENCOUNTER — Ambulatory Visit (INDEPENDENT_AMBULATORY_CARE_PROVIDER_SITE_OTHER): Payer: Medicare Other | Admitting: Neurology

## 2021-08-02 DIAGNOSIS — Z6841 Body Mass Index (BMI) 40.0 and over, adult: Secondary | ICD-10-CM

## 2021-08-02 DIAGNOSIS — I200 Unstable angina: Secondary | ICD-10-CM

## 2021-08-02 DIAGNOSIS — G4733 Obstructive sleep apnea (adult) (pediatric): Secondary | ICD-10-CM

## 2021-08-02 DIAGNOSIS — I2510 Atherosclerotic heart disease of native coronary artery without angina pectoris: Secondary | ICD-10-CM

## 2021-08-02 DIAGNOSIS — G4719 Other hypersomnia: Secondary | ICD-10-CM

## 2021-08-02 DIAGNOSIS — G478 Other sleep disorders: Secondary | ICD-10-CM

## 2021-08-02 DIAGNOSIS — E66813 Obesity, class 3: Secondary | ICD-10-CM

## 2021-08-08 DIAGNOSIS — I10 Essential (primary) hypertension: Secondary | ICD-10-CM | POA: Diagnosis not present

## 2021-08-08 DIAGNOSIS — E1165 Type 2 diabetes mellitus with hyperglycemia: Secondary | ICD-10-CM | POA: Diagnosis not present

## 2021-08-08 DIAGNOSIS — E1142 Type 2 diabetes mellitus with diabetic polyneuropathy: Secondary | ICD-10-CM | POA: Diagnosis not present

## 2021-08-08 DIAGNOSIS — E782 Mixed hyperlipidemia: Secondary | ICD-10-CM | POA: Diagnosis not present

## 2021-08-09 ENCOUNTER — Other Ambulatory Visit: Payer: Self-pay

## 2021-08-09 ENCOUNTER — Ambulatory Visit: Payer: Medicare Other | Attending: Orthopedic Surgery | Admitting: Rehabilitative and Restorative Service Providers"

## 2021-08-09 ENCOUNTER — Encounter: Payer: Self-pay | Admitting: Rehabilitative and Restorative Service Providers"

## 2021-08-09 DIAGNOSIS — M542 Cervicalgia: Secondary | ICD-10-CM | POA: Diagnosis not present

## 2021-08-09 DIAGNOSIS — R252 Cramp and spasm: Secondary | ICD-10-CM | POA: Insufficient documentation

## 2021-08-09 DIAGNOSIS — M5416 Radiculopathy, lumbar region: Secondary | ICD-10-CM | POA: Insufficient documentation

## 2021-08-09 DIAGNOSIS — R262 Difficulty in walking, not elsewhere classified: Secondary | ICD-10-CM | POA: Diagnosis not present

## 2021-08-09 DIAGNOSIS — M6281 Muscle weakness (generalized): Secondary | ICD-10-CM | POA: Diagnosis not present

## 2021-08-09 NOTE — Patient Instructions (Signed)
Access Code: [email or web address] URL: [email or web address] Date: 08/09/2021 Prepared by: Shelby Dubin Rishi Vicario  Exercises Seated Scapular Retraction - 2 x daily - 7 x weekly - 2 sets - 10 reps Seated Cervical Retraction - 2 x daily - 7 x weekly - 2 sets - 10 reps Supine Hamstring Stretch - 2 x daily - 7 x weekly - 1 sets - 4 reps - 20 sec hold Supine Piriformis Stretch with Foot on Ground - 2 x daily - 7 x weekly - 1 sets - 4 reps - 20 sec hold

## 2021-08-09 NOTE — Therapy (Signed)
[facility] [place], [place], [address] Phone: [phone]   Fax:  [phone]  Physical Therapy Evaluation  Patient Details  Name: Shannon Rosario MRN: [id number] Date of Birth: [birth year] Referring Provider (PT): Dr Edmonia Lynch   Encounter Date: 08/09/2021   PT End of Session - 08/09/21 1144     Visit Number 1    Date for PT Re-Evaluation 09/29/21    PT Start Time 1100    PT Stop Time 1140    PT Time Calculation (min) 40 min    Activity Tolerance Patient tolerated treatment well    Behavior During Therapy Shannon Rosario for tasks assessed/performed             Past Medical History:  Diagnosis Date   Anxiety    Arthritis    Depression    Diabetes mellitus without complication ([place])    Family history of adverse reaction to anesthesia    My Mother had some complications,I dont remember what    Fibromyalgia    GERD (gastroesophageal reflux disease)    Heart murmur    Hypercholesterolemia    Hypertension    Obesity    OSA (obstructive sleep apnea)    TIA (transient ischemic attack) 12/24/2010    Past Surgical History:  Procedure Laterality Date   [place] Left 1995   LEFT HEART CATH AND CORONARY ANGIOGRAPHY N/A 03/16/2021   Procedure: LEFT HEART CATH AND CORONARY ANGIOGRAPHY;  Surgeon: Nigel Mormon, MD;  Location: [facility];  Service: Cardiovascular;  Laterality: N/A;   [place]    There were no vitals filed for this visit.    Subjective Assessment - 08/09/21 1105     Subjective Pt reports that L hip pain has been there for at least 6 months and cervical/shoulder pain has been hurting for over 20 years.  Pt uncertain of etiology for either areas.    Pertinent History DDD    Limitations Sitting;Walking;Standing    How long can you sit  comfortably? 30 min    How long can you stand comfortably? 15 min    How long can you walk comfortably? 30-40 min    Diagnostic tests resent sleep study for OSA, radiographs    Patient Stated Goals I want the pain to be gone.    Currently in Pain? Yes    Pain Score 6     Pain Location Hip    Pain Orientation Left    Pain Descriptors / Indicators Penetrating    Pain Type Chronic pain    Pain Onset More than a month ago    Pain Frequency Constant    Multiple Pain Sites Yes    Pain Score 4    Pain Location Neck    Pain Orientation Right    Pain Descriptors / Indicators Radiating;Sharp    Pain Type Chronic pain                [facility] PT Assessment - 08/09/21 0001       Assessment   Medical Diagnosis Cervicalgia, Lumbar Radiculopathy    Referring Provider (PT) Dr Edmonia Lynch    Hand Dominance Right    Prior Therapy PT for shoulder      Precautions   Precautions None  Restrictions   Weight Bearing Restrictions No      Balance Screen   Has the patient fallen in the past 6 months No      [place] residence    Living Arrangements Spouse/significant other    Type of [place] to enter    Home Layout Two level;Bed/bath upstairs      Prior Function   Level of Independence Independent    Vocation On disability    Museum/gallery curator    Leisure color/drawing, puzzles, play cards, reading, go to the movies      Cognition   Overall Cognitive Status Within Functional Limits for tasks assessed      Observation/Other Assessments   Focus on Therapeutic Outcomes (FOTO)  41%      ROM / Strength   AROM / PROM / Strength AROM;Strength      AROM   Overall AROM Comments cervical ROM limited by at least 25%      Strength   Overall Strength Comments R shoulder strength of 4-/5, L shoulder 4/5.  L hip 4-/5, R hip 4/5      Transfers   Five time sit to stand comments  16 sec   with pain                        Objective measurements completed on examination: See above findings.               PT Education - 08/09/21 1138     Education Details Pt provided with HEP    Person(s) Educated Patient    Methods Explanation;Demonstration;Handout    Comprehension Verbalized understanding;Returned demonstration              PT Short Term Goals - 08/09/21 1323       PT SHORT TERM GOAL #1   Title Pt will be independent with initial HEP.    Time 2    Period Weeks    Status New               PT Long Term Goals - 08/09/21 1324       PT LONG TERM GOAL #1   Title Pt will be independent with advanced HEP.    Time 8    Period Weeks    Status New      PT LONG TERM GOAL #2   Title Pt will verbalize/demonstrate understanding of proper posture and body mechanics to reduce strain on low back during daily tasks    Time 8    Status New      PT LONG TERM GOAL #3   Title Pt will report >/= 25% improvement in cervicalgia and LBP for improved standing and walking tolerance    Time 8    Period Weeks    Status New      PT LONG TERM GOAL #4   Title Pt will demostrate increased in L hip and R shoulder strength to 4-/5 to 4/5 for improved proximal stability    Time 8    Period Weeks    Status New      PT LONG TERM GOAL #5   Title Pt will report ability to walk for 15-20 minutes w/o reliance on UE/UB support on grocery cart w/o increased LBP to improve tolerance for grocery shopping    Status New  Plan - 08/09/21 1145     Clinical Impression Statement Patient is a 60 y.o. female with a referral from Dr Edmonia Lynch secondary to cervicalgia and lumbar radiculopathy.  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  However, secondary to pain and impairment, she is currently out of work on [place].  Pts cervical pain is radiating down her R shoulder and her lumbar pain is radiating to her L hip  region.  Patient presents with weakness, decreased flexibility, decreased postural awareness, and difficulty walking.  She reports that she has been doing some stretching independently prior to coming to PT.  Shannon Rosario would benefit from skilled PT to address her functional impairments to allow her to be more independent in the community.    Personal Factors and Comorbidities Fitness    Examination-Activity Limitations Locomotion Level;Sleep;Stairs    Examination-Participation Restrictions Community Activity    Stability/Clinical Decision Making Evolving/Moderate complexity    Clinical Decision Making Moderate    Rehab Potential Good    PT Frequency 2x / week    PT Duration 8 weeks    PT Treatment/Interventions ADLs/Self Care Home Management;Cryotherapy;Electrical Stimulation;Iontophoresis '4mg'$ /ml Dexamethasone;Moist Heat;Traction;Ultrasound;Gait training;Stair training;Functional mobility training;Therapeutic activities;Therapeutic exercise;Balance training;Neuromuscular re-education;Patient/family education;Manual techniques;Passive range of motion;Dry needling;Taping;Spinal Manipulations;Joint Manipulations    PT Next Visit Plan core stability, strengthening    PT Home Exercise Plan Access Code: [email or web address]    Consulted and Agree with Plan of Care Patient             Patient will benefit from skilled therapeutic intervention in order to improve the following deficits and impairments:  Difficulty walking, Decreased endurance, Increased muscle spasms, Obesity, Decreased activity tolerance, Pain, Impaired flexibility, Improper body mechanics, Decreased strength, Postural dysfunction  Visit Diagnosis: Cervicalgia - Plan: PT plan of care cert/re-cert  Lumbar radiculopathy - Plan: PT plan of care cert/re-cert  Muscle weakness (generalized) - Plan: PT plan of care cert/re-cert  Cramp and spasm - Plan: PT plan of care cert/re-cert  Difficulty in walking, not elsewhere classified - Plan: PT  plan of care cert/re-cert     Problem List Patient Active Problem List   Diagnosis Date Noted   Unstable angina ([place]) 03/16/2021   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   Fire ant bite 10/22/2016   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 [place], [place], [address] Phone: [phone]   Fax:  [phone]  Name: Shannon Rosario MRN: [id number] Date of Birth: 07/09/1961

## 2021-08-10 DIAGNOSIS — I10 Essential (primary) hypertension: Secondary | ICD-10-CM | POA: Diagnosis not present

## 2021-08-10 DIAGNOSIS — E1165 Type 2 diabetes mellitus with hyperglycemia: Secondary | ICD-10-CM | POA: Diagnosis not present

## 2021-08-10 DIAGNOSIS — N3000 Acute cystitis without hematuria: Secondary | ICD-10-CM | POA: Diagnosis not present

## 2021-08-10 DIAGNOSIS — Z0001 Encounter for general adult medical examination with abnormal findings: Secondary | ICD-10-CM | POA: Diagnosis not present

## 2021-08-10 DIAGNOSIS — E1142 Type 2 diabetes mellitus with diabetic polyneuropathy: Secondary | ICD-10-CM | POA: Diagnosis not present

## 2021-08-10 DIAGNOSIS — E782 Mixed hyperlipidemia: Secondary | ICD-10-CM | POA: Diagnosis not present

## 2021-08-11 DIAGNOSIS — G478 Other sleep disorders: Secondary | ICD-10-CM | POA: Insufficient documentation

## 2021-08-11 DIAGNOSIS — I200 Unstable angina: Secondary | ICD-10-CM | POA: Insufficient documentation

## 2021-08-11 NOTE — Procedures (Signed)
PATIENT'S NAME:  Shannon Rosario, Shannon Rosario DOB:      17-Jan-1961      MR#:    [id number]     DATE OF RECORDING: 08/02/2021 REFERRING M.D.:  Rex Kras, DO Study Performed:   Baseline Polysomnogram HISTORY:  Shannon Rosario is a right-handed [place] American female with history of OSA sleep disorder Anxiety, Arthritis, Depression, Diabetes mellitus without complication ([place]), Family history of adverse reaction to anesthesia, Fibromyalgia, GERD (gastroesophageal reflux disease), Heart murmur, Hypercholesterolemia, Hypertension, Morbid Obesity- BMI of 40 plus OSA (obstructive sleep apnea), and TIA (transient ischemic attack) in the frame of a dobutamine stress test (12/24/2010). She was given a CPAP and has not sued it. Diagnosed with OSA in 2015. She reports angina pectoris, see echo, cardiac catheter and stress test results.  The patient had the first sleep study in the year 2006 in [place] and one in [place], [place], no results were available.    CPAP was recommended and prescribed but she never used it at home.   She sleeps only 4-5 hours at night and considers this normal for her but her sleep is fragmented. Her husband noted snoring and apnea.   The patient endorsed the Epworth Sleepiness Scale at 10 points.   The patient's weight 264 pounds with a height of 68 (inches), resulting in a BMI of 40.1 kg/m2. The patient's neck circumference measured 16.5 inches.  CURRENT MEDICATIONS: Norvasc, Aspirin, Vitamin D, Restasis, Epinephrine, Neurontin, Amaryl, Xyzal, Hyzaar, Glucophage, Nitrostat, Prilosec, Trulicity, Toprol-XL, Crestor   PROCEDURE:  This is a multichannel digital polysomnogram utilizing the Somnostar 11.2 system.  Electrodes and sensors were applied and monitored per AASM Specifications.   EEG, EOG, Chin and Limb EMG, were sampled at 200 Hz.  ECG, Snore and Nasal Pressure, Thermal Airflow, Respiratory Effort, CPAP Flow and Pressure, Oximetry was sampled at 50 Hz. Digital video and audio  were recorded.      BASELINE STUDY: Lights Out was at 22:05 and Lights On at 04:32.  Total recording time (TRT) was 388 minutes, with a total sleep time (TST) of 265 minutes. The patient's sleep latency was 53 minutes. REM latency was 153.5 minutes.  The sleep efficiency was 68.3 %.     SLEEP ARCHITECTURE: WASO (Wake after sleep onset) was 79.5 minutes.  There were 33.5 minutes in Stage N1, 206.5 minutes Stage N2, 12.5 minutes Stage N3 and 12.5 minutes in Stage REM.  The percentage of Stage N1 was 12.6%, Stage N2 was 77.9%, Stage N3 was 4.7% and Stage R (REM sleep) was 4.7%.   RESPIRATORY ANALYSIS:  There were a total of 15 respiratory events:  1 obstructive apnea, 0 central apneas and 2 mixed apneas with a total of 3 apneas and an apnea index (AI) of 0.7 /hour. There were 12 hypopneas with a hypopnea index of 2.7 /hour.  The total APNEA/HYPOPNEA INDEX (AHI) was 3.4/hour. 4 events occurred in REM sleep and 21 events in NREM.  The REM AHI was 19.2 /hour, versus a non-REM AHI of 2.6/h.  The patient spent 0 minutes of total sleep time in the supine position and 265 minutes in non-supine.  The supine AHI was 0.0 versus a non-supine AHI of 3.4.  OXYGEN SATURATION & C02:  The Wake baseline 02 saturation was 96%, with the lowest being 88%. Time spent below 89% saturation equaled 0 minutes.   The arousals were noted as: 82 were spontaneous, 0 were associated with PLMs, 9 were associated with respiratory events. Sleep was very fragmented.  No Periodic Limb  Movements.   Audio and video analysis did not show any abnormal or unusual movements, behaviors, phonations or vocalizations.  The patient took bathroom breaks. Snoring was noted, at mild to moderate levels. EKG was in keeping with normal sinus rhythm (NSR).  IMPRESSION:   There was no indication of clinically relevant sleep apnea- no Obstructive Sleep Apnea (OSA). Primary Snoring was present. Frequent arousals were seen but no main cause was  detected. The patient had several coughing spells that caused some arousals.  No cardiac arrhythmia was noted.   RECOMMENDATIONS:  I certify that I have reviewed the entire raw data recording prior to the issuance of this report in accordance with the Standards of Accreditation of the American Academy of Sleep Medicine (AASM)  Larey Seat, MD Diplomat, American Board of Psychiatry and Neurology  Diplomat, American Board of Sleep Medicine Market researcher, [place] [facility] [employer]

## 2021-08-11 NOTE — Progress Notes (Signed)
PATIENT'S NAME:  Shannon Rosario, High DOB:      04-11-1961      MR#:    [id number]     DATE OF RECORDING: 08/02/2021 REFERRING M.D.:  Rex Kras, DO Study Performed:   Baseline Polysomnogram HISTORY:  Kanecia Chick is a right-handed [place] American female with history of OSA sleep disorder Anxiety, Arthritis, Depression, Diabetes mellitus without complication ([place]), Family history of adverse reaction to anesthesia, Fibromyalgia, GERD (gastroesophageal reflux disease), Heart murmur, Hypercholesterolemia, Hypertension, Morbid Obesity- BMI of 40 plus OSA (obstructive sleep apnea), and TIA (transient ischemic attack) in the frame of a dobutamine stress test (12/24/2010). She was given a CPAP and has not sued it. Diagnosed with OSA in 2015. She reports angina pectoris, see echo, cardiac catheter and stress test results.  The patient had the first sleep study in the year 2006 in [place] and one in [place], [place], no results were available.    CPAP was recommended and prescribed but she never used it at home.   She sleeps only 4-5 hours at night and considers this normal for her but her sleep is fragmented. Her husband noted snoring and apnea.   The patient endorsed the Epworth Sleepiness Scale at 10 points.   The patient's weight 264 pounds with a height of 68 (inches), resulting in a BMI of 40.1 kg/m2. The patient's neck circumference measured 16.5 inches.  CURRENT MEDICATIONS: Norvasc, Aspirin, Vitamin D, Restasis, Epinephrine, Neurontin, Amaryl, Xyzal, Hyzaar, Glucophage, Nitrostat, Prilosec, Trulicity, Toprol-XL, Crestor   PROCEDURE:  This is a multichannel digital polysomnogram utilizing the Somnostar 11.2 system.  Electrodes and sensors were applied and monitored per AASM Specifications.   EEG, EOG, Chin and Limb EMG, were sampled at 200 Hz.  ECG, Snore and Nasal Pressure, Thermal Airflow, Respiratory Effort, CPAP Flow and Pressure, Oximetry was sampled at 50 Hz. Digital video and audio  were recorded.      BASELINE STUDY: Lights Out was at 22:05 and Lights On at 04:32.  Total recording time (TRT) was 388 minutes, with a total sleep time (TST) of 265 minutes. The patient's sleep latency was 53 minutes. REM latency was 153.5 minutes.  The sleep efficiency was 68.3 %.     SLEEP ARCHITECTURE: WASO (Wake after sleep onset) was 79.5 minutes.  There were 33.5 minutes in Stage N1, 206.5 minutes Stage N2, 12.5 minutes Stage N3 and 12.5 minutes in Stage REM.  The percentage of Stage N1 was 12.6%, Stage N2 was 77.9%, Stage N3 was 4.7% and Stage R (REM sleep) was 4.7%.   RESPIRATORY ANALYSIS:  There were a total of 15 respiratory events:  1 obstructive apnea, 0 central apneas and 2 mixed apneas with a total of 3 apneas and an apnea index (AI) of 0.7 /hour. There were 12 hypopneas with a hypopnea index of 2.7 /hour.  The total APNEA/HYPOPNEA INDEX (AHI) was 3.4/hour. 4 events occurred in REM sleep and 21 events in NREM.  The REM AHI was 19.2 /hour, versus a non-REM AHI of 2.6/h.  The patient spent 0 minutes of total sleep time in the supine position and 265 minutes in non-supine.  The supine AHI was 0.0 versus a non-supine AHI of 3.4.  OXYGEN SATURATION & C02:  The Wake baseline 02 saturation was 96%, with the lowest being 88%. Time spent below 89% saturation equaled 0 minutes.   The arousals were noted as: 82 were spontaneous, 0 were associated with PLMs, 9 were associated with respiratory events. Sleep was very fragmented.  No Periodic Limb  Movements.   Audio and video analysis did not show any abnormal or unusual movements, behaviors, phonations or vocalizations.  The patient took bathroom breaks. Snoring was noted, at mild to moderate levels. EKG was in keeping with normal sinus rhythm (NSR).  IMPRESSION:   There was no indication of clinically relevant sleep apnea- no Obstructive Sleep Apnea (OSA). Primary Snoring was present. Frequent arousals were seen but no main cause was  detected. The patient had several coughing spells that caused some arousals.  No cardiac arrhythmia was noted.   RECOMMENDATIONS:  I certify that I have reviewed the entire raw data recording prior to the issuance of this report in accordance with the Standards of Accreditation of the American Academy of Sleep Medicine (AASM)  Larey Seat, MD Diplomat, American Board of Psychiatry and Neurology  Diplomat, American Board of Sleep Medicine Market researcher, [place] [facility] [employer]

## 2021-08-14 ENCOUNTER — Telehealth: Payer: Self-pay | Admitting: *Deleted

## 2021-08-14 NOTE — Telephone Encounter (Signed)
Tried calling pt, mailbox full and could not leave message.

## 2021-08-14 NOTE — Telephone Encounter (Signed)
-----   Message from Larey Seat, MD sent at 08/11/2021  1:47 PM EDT ----- IMPRESSION:   1. There was no indication of clinically relevant sleep apnea- no Obstructive Sleep Apnea (OSA).              No hypoxia. 2. Primary Snoring was present. 3. Frequent arousals were seen but no main cause was detected. The patient had several coughing spells that caused some arousals.  4. No cardiac arrhythmia was noted.   RECOMMENDATIONS:  No physiologic sleep disorder was identified. Follow up with the sleep clinic is optional.

## 2021-08-15 ENCOUNTER — Encounter: Payer: Self-pay | Admitting: Rehabilitative and Restorative Service Providers"

## 2021-08-15 ENCOUNTER — Other Ambulatory Visit: Payer: Self-pay

## 2021-08-15 ENCOUNTER — Ambulatory Visit: Payer: Medicare Other | Admitting: Rehabilitative and Restorative Service Providers"

## 2021-08-15 DIAGNOSIS — M542 Cervicalgia: Secondary | ICD-10-CM | POA: Diagnosis not present

## 2021-08-15 DIAGNOSIS — R262 Difficulty in walking, not elsewhere classified: Secondary | ICD-10-CM

## 2021-08-15 DIAGNOSIS — M5416 Radiculopathy, lumbar region: Secondary | ICD-10-CM | POA: Diagnosis not present

## 2021-08-15 DIAGNOSIS — M6281 Muscle weakness (generalized): Secondary | ICD-10-CM

## 2021-08-15 DIAGNOSIS — R252 Cramp and spasm: Secondary | ICD-10-CM | POA: Diagnosis not present

## 2021-08-15 NOTE — Therapy (Signed)
[facility] [place], [place], [address] Phone: [phone]   Fax:  [phone]  Physical Therapy Treatment  Patient Details  Name: Shannon Rosario MRN: [id number] Date of Birth: April 03, 1961 Referring Provider (PT): Dr Edmonia Lynch   Encounter Date: 08/15/2021   PT End of Session - 08/15/21 1022     Visit Number 2    Date for PT Re-Evaluation 09/29/21    PT Start Time 6384    PT Stop Time 1055    PT Time Calculation (min) 40 min    Activity Tolerance Patient tolerated treatment well    Behavior During Therapy [facility] for tasks assessed/performed             Past Medical History:  Diagnosis Date   Anxiety    Arthritis    Depression    Diabetes mellitus without complication ([place])    Family history of adverse reaction to anesthesia    My Mother had some complications,I dont remember what    Fibromyalgia    GERD (gastroesophageal reflux disease)    Heart murmur    Hypercholesterolemia    Hypertension    Obesity    OSA (obstructive sleep apnea)    TIA (transient ischemic attack) 12/24/2010    Past Surgical History:  Procedure Laterality Date   [place] Left 1995   LEFT HEART CATH AND CORONARY ANGIOGRAPHY N/A 03/16/2021   Procedure: LEFT HEART CATH AND CORONARY ANGIOGRAPHY;  Surgeon: Nigel Mormon, MD;  Location: [facility];  Service: Cardiovascular;  Laterality: N/A;   [place]    There were no vitals filed for this visit.   Subjective Assessment - 08/15/21 1019     Subjective I am feeling really bloated and when I have a BM, the color is not right.  Pt eduated to contact MD.    Patient Stated Goals I want the pain to be gone.    Currently in Pain? Yes    Pain Score 5     Pain Location Hip    Pain Orientation Left    Pain Descriptors /  Indicators Penetrating    Pain Type Chronic pain    Pain Score 7    Pain Location Neck    Pain Orientation Right    Pain Descriptors / Indicators Radiating    Pain Radiating Towards into R shoulder                               [facility] Adult PT Treatment/Exercise - 08/15/21 0001       Ambulation/Gait   Gait Comments Ambulation outside x800 ft without AD with distant SBA and no loss of balance with dyspnea following.      Exercises   Exercises Neck;Lumbar      Neck Exercises: Machines for Strengthening   UBE (Upper Arm Bike) L1.5 x3 min each way      Neck Exercises: Standing   Neck Retraction 20 reps;3 secs      Neck Exercises: Seated   Shoulder Shrugs 20 reps    Shoulder Shrugs Limitations 2#    Shoulder Rolls Backwards;20 reps    Shoulder Rolls Limitations 2#    Shoulder Flexion Both;10 reps    Shoulder Flexion Weights (lbs)  2    Shoulder ABduction Both;10 reps    Shoulder Abduction Weights (lbs) 2      Neck Exercises: Stretches   Upper Trapezius Stretch Right;Left;3 reps;20 seconds    Levator Stretch Right;Left;3 reps;20 seconds      Lumbar Exercises: Stretches   Gastroc Stretch Right;Left;3 reps;20 seconds      Lumbar Exercises: Standing   Other Standing Lumbar Exercises Alt taps on 6" box 2x10      Lumbar Exercises: Seated   Sit to Stand 10 reps                      PT Short Term Goals - 08/15/21 1123       PT SHORT TERM GOAL #1   Title Pt will be independent with initial HEP.    Status Partially Met               PT Long Term Goals - 08/09/21 1324       PT LONG TERM GOAL #1   Title Pt will be independent with advanced HEP.    Time 8    Period Weeks    Status New      PT LONG TERM GOAL #2   Title Pt will verbalize/demonstrate understanding of proper posture and body mechanics to reduce strain on low back during daily tasks    Time 8    Status New      PT LONG TERM GOAL #3   Title Pt will report >/= 25%  improvement in cervicalgia and LBP for improved standing and walking tolerance    Time 8    Period Weeks    Status New      PT LONG TERM GOAL #4   Title Pt will demostrate increased in L hip and R shoulder strength to 4-/5 to 4/5 for improved proximal stability    Time 8    Period Weeks    Status New      PT LONG TERM GOAL #5   Title Pt will report ability to walk for 15-20 minutes w/o reliance on UE/UB support on grocery cart w/o increased LBP to improve tolerance for grocery shopping    Status New                   Plan - 08/15/21 1120     Clinical Impression Statement Despite not feeling well, pt did well with therapy session.  She states that she has been performing her HEP daily.  She was able to ambulate outside for increased distance without LOB and without any reports of increased pain.  She required min cuing during therapy session for postural corrections.    PT Treatment/Interventions ADLs/Self Care Home Management;Cryotherapy;Electrical Stimulation;Iontophoresis 63m/ml Dexamethasone;Moist Heat;Traction;Ultrasound;Gait training;Stair training;Functional mobility training;Therapeutic activities;Therapeutic exercise;Balance training;Neuromuscular re-education;Patient/family education;Manual techniques;Passive range of motion;Dry needling;Taping;Spinal Manipulations;Joint Manipulations    PT Next Visit Plan core stability, strengthening    Consulted and Agree with Plan of Care Patient             Patient will benefit from skilled therapeutic intervention in order to improve the following deficits and impairments:  Difficulty walking, Decreased endurance, Increased muscle spasms, Obesity, Decreased activity tolerance, Pain, Impaired flexibility, Improper body mechanics, Decreased strength, Postural dysfunction  Visit Diagnosis: Cervicalgia  Lumbar radiculopathy  Muscle weakness (generalized)  Cramp and spasm  Difficulty in walking, not elsewhere  classified     Problem List Patient Active Problem List   Diagnosis Date Noted  Angina pectoris, unstable ([place]) 08/11/2021   Non-restorative sleep 08/11/2021   Unstable angina ([place]) 03/16/2021   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   Fire ant bite 10/22/2016   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 [place], [place], [address] Phone: [phone]   Fax:  [phone]  Name: Shannon Rosario MRN: [id number] Date of Birth: 06-Jul-1961

## 2021-08-16 NOTE — Telephone Encounter (Signed)
Called the patient and was able to review the sleep study results in detail with the patient.  Informed the patient there was no organic sleep disorder present.  Patient verbalized understanding of the results and had no further questions.  Instructed the patient to call if she would like to schedule a follow-up appointment otherwise it is not needed in the sleep clinic.

## 2021-08-17 ENCOUNTER — Ambulatory Visit: Payer: Medicare Other | Admitting: Physical Therapy

## 2021-08-22 ENCOUNTER — Other Ambulatory Visit: Payer: Self-pay

## 2021-08-22 ENCOUNTER — Ambulatory Visit: Payer: Medicare Other | Admitting: Rehabilitative and Restorative Service Providers"

## 2021-08-22 ENCOUNTER — Encounter: Payer: Self-pay | Admitting: Rehabilitative and Restorative Service Providers"

## 2021-08-22 DIAGNOSIS — M6281 Muscle weakness (generalized): Secondary | ICD-10-CM

## 2021-08-22 DIAGNOSIS — M5416 Radiculopathy, lumbar region: Secondary | ICD-10-CM | POA: Diagnosis not present

## 2021-08-22 DIAGNOSIS — M542 Cervicalgia: Secondary | ICD-10-CM | POA: Diagnosis not present

## 2021-08-22 DIAGNOSIS — R262 Difficulty in walking, not elsewhere classified: Secondary | ICD-10-CM | POA: Diagnosis not present

## 2021-08-22 DIAGNOSIS — R252 Cramp and spasm: Secondary | ICD-10-CM | POA: Diagnosis not present

## 2021-08-22 NOTE — Therapy (Signed)
[facility]. [place], [place], [address] Phone: [phone]   Fax:  [phone]  Physical Therapy Treatment  Patient Details  Name: Shannon Rosario MRN: [id number] Date of Birth: Mar 03, 1961 Referring Provider (PT): Dr Edmonia Lynch   Encounter Date: 08/22/2021   PT End of Session - 08/22/21 1237     Visit Number 3    Date for PT Re-Evaluation 09/29/21    PT Start Time 1233    PT Stop Time 1315    PT Time Calculation (min) 42 min    Activity Tolerance Patient tolerated treatment well    Behavior During Therapy [facility] for tasks assessed/performed             Past Medical History:  Diagnosis Date   Anxiety    Arthritis    Depression    Diabetes mellitus without complication ([place])    Family history of adverse reaction to anesthesia    My Mother had some complications,I dont remember what    Fibromyalgia    GERD (gastroesophageal reflux disease)    Heart murmur    Hypercholesterolemia    Hypertension    Obesity    OSA (obstructive sleep apnea)    TIA (transient ischemic attack) 12/24/2010    Past Surgical History:  Procedure Laterality Date   [place] Left 1995   LEFT HEART CATH AND CORONARY ANGIOGRAPHY N/A 03/16/2021   Procedure: LEFT HEART CATH AND CORONARY ANGIOGRAPHY;  Surgeon: Nigel Mormon, MD;  Location: [facility];  Service: Cardiovascular;  Laterality: N/A;   [place]    There were no vitals filed for this visit.   Subjective Assessment - 08/22/21 1235     Subjective My doctor gave me some laxatives, but something is not right.  I am going back to see him this afternoon.    Patient Stated Goals I want the pain to be gone.    Currently in Pain? Yes    Pain Score 9     Pain Location Hip    Pain Orientation Left    Pain Score 6     Pain Location Neck    Pain Orientation Right    Pain Descriptors / Indicators Radiating    Pain Radiating Towards into R shoulder                               [facility] Adult PT Treatment/Exercise - 08/22/21 0001       Transfers   Five time sit to stand comments  15.2 sec      Neck Exercises: Machines for Strengthening   UBE (Upper Arm Bike) L1.5 x3 min each way    Cybex Row 15# 2x10    Lat Pull 15# 2x10      Neck Exercises: Standing   Neck Retraction 20 reps;3 secs      Neck Exercises: Stretches   Upper Trapezius Stretch Right;Left;3 reps;20 seconds    Levator Stretch Right;Left;3 reps;20 seconds      Lumbar Exercises: Stretches   Gastroc Stretch Right;Left;3 reps;20 seconds      Lumbar Exercises: Standing   Other Standing Lumbar Exercises 3 way hip with 2# x10 each bilat.      Lumbar Exercises: Seated  Sit to Stand 10 reps    Other Seated Lumbar Exercises 4 way pelvic tilt on dynadisc x10 each      Manual Therapy   Manual Therapy Soft tissue mobilization;Myofascial release    Manual therapy comments seated, then prone    Soft tissue mobilization B lumbar paraspinals and L piriformis    Myofascial Release DN to L lumbar paraspinals and L piriformis              Trigger Point Dry Needling - 08/22/21 0001     Consent Given? Yes    Education Handout Provided Previously provided    Muscles Treated Back/Hip Piriformis;Lumbar multifidi    Piriformis Response Twitch response elicited    Lumbar multifidi Response Twitch response elicited                    PT Short Term Goals - 08/22/21 1328       PT SHORT TERM GOAL #1   Title Pt will be independent with initial HEP.    Status Achieved               PT Long Term Goals - 08/22/21 1328       PT LONG TERM GOAL #1   Title Pt will be independent with advanced HEP.    Status On-going      PT LONG TERM GOAL #2   Title Pt will verbalize/demonstrate understanding of proper  posture and body mechanics to reduce strain on low back during daily tasks    Status On-going      PT LONG TERM GOAL #3   Title Pt will report >/= 25% improvement in cervicalgia and LBP for improved standing and walking tolerance    Status On-going      PT LONG TERM GOAL #4   Title Pt will demostrate increased in L hip and R shoulder strength to 4-/5 to 4/5 for improved proximal stability    Status On-going      PT LONG TERM GOAL #5   Title Pt will report ability to walk for 15-20 minutes w/o reliance on UE/UB support on grocery cart w/o increased LBP to improve tolerance for grocery shopping    Status On-going                   Plan - 08/22/21 1324     Clinical Impression Statement Pt is progressing towards goal related activities.  She was presenting with increased L back/hip pain today and requested DN today to decrease pain.  Pt verbalizes understanding of DN from previous encounter.  She reported pain decreased in her L hip/low back to 3/10 following DN procedure.  Pt may benefit from DN to cervical/upper quadrant area next session, if pain persists.  Pt tolerated session and ther ex well.  She continues to require skilled PT to progress towards goal related activities.    PT Treatment/Interventions ADLs/Self Care Home Management;Cryotherapy;Electrical Stimulation;Iontophoresis '4mg'$ /ml Dexamethasone;Moist Heat;Traction;Ultrasound;Gait training;Stair training;Functional mobility training;Therapeutic activities;Therapeutic exercise;Balance training;Neuromuscular re-education;Patient/family education;Manual techniques;Passive range of motion;Dry needling;Taping;Spinal Manipulations;Joint Manipulations    PT Next Visit Plan DN if indicated, core stability, strengthening    Consulted and Agree with Plan of Care Patient             Patient will benefit from skilled therapeutic intervention in order to improve the following deficits and impairments:  Difficulty walking, Decreased  endurance, Increased muscle spasms, Obesity, Decreased activity tolerance, Pain, Impaired flexibility, Improper body mechanics, Decreased strength, Postural dysfunction  Visit  Diagnosis: Cervicalgia  Lumbar radiculopathy  Muscle weakness (generalized)  Cramp and spasm  Difficulty in walking, not elsewhere classified     Problem List Patient Active Problem List   Diagnosis Date Noted   Angina pectoris, unstable ([place]) 08/11/2021   Non-restorative sleep 08/11/2021   Unstable angina ([place]) 03/16/2021   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   Fire ant bite 10/22/2016   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 [place], [place], [address] Phone: [phone]   Fax:  [phone]  Name: Shannon Rosario MRN: [id number] Date of Birth: Dec 11, 1961

## 2021-08-23 DIAGNOSIS — E782 Mixed hyperlipidemia: Secondary | ICD-10-CM | POA: Diagnosis not present

## 2021-08-23 DIAGNOSIS — K5909 Other constipation: Secondary | ICD-10-CM | POA: Diagnosis not present

## 2021-08-23 DIAGNOSIS — E1142 Type 2 diabetes mellitus with diabetic polyneuropathy: Secondary | ICD-10-CM | POA: Diagnosis not present

## 2021-08-23 DIAGNOSIS — I10 Essential (primary) hypertension: Secondary | ICD-10-CM | POA: Diagnosis not present

## 2021-08-23 DIAGNOSIS — M25552 Pain in left hip: Secondary | ICD-10-CM | POA: Diagnosis not present

## 2021-08-23 DIAGNOSIS — E1165 Type 2 diabetes mellitus with hyperglycemia: Secondary | ICD-10-CM | POA: Diagnosis not present

## 2021-08-24 ENCOUNTER — Ambulatory Visit: Payer: Medicare Other | Attending: Orthopedic Surgery | Admitting: Rehabilitative and Restorative Service Providers"

## 2021-08-24 ENCOUNTER — Other Ambulatory Visit: Payer: Self-pay

## 2021-08-24 ENCOUNTER — Encounter: Payer: Self-pay | Admitting: Rehabilitative and Restorative Service Providers"

## 2021-08-24 DIAGNOSIS — R262 Difficulty in walking, not elsewhere classified: Secondary | ICD-10-CM | POA: Insufficient documentation

## 2021-08-24 DIAGNOSIS — M5416 Radiculopathy, lumbar region: Secondary | ICD-10-CM | POA: Insufficient documentation

## 2021-08-24 DIAGNOSIS — R252 Cramp and spasm: Secondary | ICD-10-CM | POA: Insufficient documentation

## 2021-08-24 DIAGNOSIS — M6281 Muscle weakness (generalized): Secondary | ICD-10-CM | POA: Insufficient documentation

## 2021-08-24 DIAGNOSIS — M542 Cervicalgia: Secondary | ICD-10-CM | POA: Diagnosis not present

## 2021-08-24 NOTE — Therapy (Signed)
[place]. [place], [place], [address] Phone: [phone]   Fax:  [phone]  Physical Therapy Treatment  Patient Details  Name: Shannon Rosario MRN: [id number] Date of Birth: 1961/07/13 Referring Provider (PT): Dr Edmonia Lynch   Encounter Date: 08/24/2021   PT End of Session - 08/24/21 1242     Visit Number 4    Date for PT Re-Evaluation 09/29/21    PT Start Time 1230    PT Stop Time 1315    PT Time Calculation (min) 45 min    Activity Tolerance Patient tolerated treatment well    Behavior During Therapy [facility] for tasks assessed/performed             Past Medical History:  Diagnosis Date   Anxiety    Arthritis    Depression    Diabetes mellitus without complication ([place])    Family history of adverse reaction to anesthesia    My Mother had some complications,I dont remember what    Fibromyalgia    GERD (gastroesophageal reflux disease)    Heart murmur    Hypercholesterolemia    Hypertension    Obesity    OSA (obstructive sleep apnea)    TIA (transient ischemic attack) 12/24/2010    Past Surgical History:  Procedure Laterality Date   [place] Left 1995   LEFT HEART CATH AND CORONARY ANGIOGRAPHY N/A 03/16/2021   Procedure: LEFT HEART CATH AND CORONARY ANGIOGRAPHY;  Surgeon: Nigel Mormon, MD;  Location: [place] [facility];  Service: Cardiovascular;  Laterality: N/A;   [place]    There were no vitals filed for this visit.   Subjective Assessment - 08/24/21 1236     Subjective They did some xrays and I do not have a bowel blockage.  I saw Dr Maretta Los yesterday and he wants to see me again in 6 weeks.    Currently in Pain? Yes    Pain Score 3     Pain Location Hip    Pain Orientation Left    Pain Score 8    Pain Location Neck    Pain  Orientation Right    Pain Descriptors / Indicators Radiating                               [facility] Adult PT Treatment/Exercise - 08/24/21 0001       Neck Exercises: Machines for Strengthening   UBE (Upper Arm Bike) L1.5 x3 min each way    Cybex Row 15# 2x10    Lat Pull 15# 2x10      Neck Exercises: Standing   Neck Retraction 20 reps;3 secs      Neck Exercises: Stretches   Upper Trapezius Stretch Right;Left;3 reps;20 seconds    Levator Stretch Right;Left;3 reps;20 seconds      Lumbar Exercises: Stretches   Gastroc Stretch Right;Left;3 reps;20 seconds      Lumbar Exercises: Standing   Heel Raises 15 reps;2 seconds    Other Standing Lumbar Exercises 3 way hip with 2.5# x10 each bilat.      Manual Therapy   Manual Therapy Soft tissue mobilization;Myofascial release    Manual therapy comments Prone    Soft tissue mobilization R cervical paraspinals, upper trap/levator  Myofascial Release DN to R cervical multifi and uper trap              Trigger Point Dry Needling - 08/24/21 0001     Consent Given? Yes    Muscles Treated Head and Neck Upper trapezius;Cervical multifidi    Upper Trapezius Response Twitch reponse elicited    Cervical multifidi Response Twitch reponse elicited                    PT Short Term Goals - 08/22/21 1328       PT SHORT TERM GOAL #1   Title Pt will be independent with initial HEP.    Status Achieved               PT Long Term Goals - 08/22/21 1328       PT LONG TERM GOAL #1   Title Pt will be independent with advanced HEP.    Status On-going      PT LONG TERM GOAL #2   Title Pt will verbalize/demonstrate understanding of proper posture and body mechanics to reduce strain on low back during daily tasks    Status On-going      PT LONG TERM GOAL #3   Title Pt will report >/= 25% improvement in cervicalgia and LBP for improved standing and walking tolerance    Status On-going      PT LONG TERM  GOAL #4   Title Pt will demostrate increased in L hip and R shoulder strength to 4-/5 to 4/5 for improved proximal stability    Status On-going      PT LONG TERM GOAL #5   Title Pt will report ability to walk for 15-20 minutes w/o reliance on UE/UB support on grocery cart w/o increased LBP to improve tolerance for grocery shopping    Status On-going                   Plan - 08/24/21 1318     Clinical Impression Statement Pt continues to progress towards goal related activities.  She has had a decrease in her L lumbar/hip pain since DN last session.  However, pt reports increased R cervical/shoulder stiffness today and request to be DN in that area, as it helped her lumbar/hip region.  Pt with a decrease in pain following DN to 3//10 and increased cervical ROM noted.  She continues to be compliant with her HEP and requires less cuing during ther ex for technique and improved posture.    PT Treatment/Interventions ADLs/Self Care Home Management;Cryotherapy;Electrical Stimulation;Iontophoresis '4mg'$ /ml Dexamethasone;Moist Heat;Traction;Ultrasound;Gait training;Stair training;Functional mobility training;Therapeutic activities;Therapeutic exercise;Balance training;Neuromuscular re-education;Patient/family education;Manual techniques;Passive range of motion;Dry needling;Taping;Spinal Manipulations;Joint Manipulations    PT Next Visit Plan DN if indicated, core stability, strengthening    Consulted and Agree with Plan of Care Patient             Patient will benefit from skilled therapeutic intervention in order to improve the following deficits and impairments:  Difficulty walking, Decreased endurance, Increased muscle spasms, Obesity, Decreased activity tolerance, Pain, Impaired flexibility, Improper body mechanics, Decreased strength, Postural dysfunction  Visit Diagnosis: Cervicalgia  Lumbar radiculopathy  Muscle weakness (generalized)  Cramp and spasm  Difficulty in walking,  not elsewhere classified     Problem List Patient Active Problem List   Diagnosis Date Noted   Angina pectoris, unstable ([place]) 08/11/2021   Non-restorative sleep 08/11/2021   Unstable angina ([place]) 03/16/2021   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  Fire ant bite 10/22/2016   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/2016   Allergic urticaria 10/22/2016   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 10/22/2016   Obstructive sleep apnea treated with continuous positive airway pressure (CPAP) 10/22/2016   Chest pain 09/21/2015   Essential hypertension 09/21/2015   Diabetes ([place]) 09/21/2015   Pain in the chest     Juel Burrow, PT, DPT 08/24/2021, 1:22 PM  [place]. [place], [place], [address] Phone: [phone]   Fax:  [phone]  Name: Shannon Rosario MRN: [id number] Date of Birth: Sep 04, 1961

## 2021-08-29 ENCOUNTER — Other Ambulatory Visit: Payer: Self-pay

## 2021-08-29 ENCOUNTER — Ambulatory Visit: Payer: Medicare Other | Admitting: Physical Therapy

## 2021-08-29 ENCOUNTER — Encounter: Payer: Self-pay | Admitting: Physical Therapy

## 2021-08-29 DIAGNOSIS — R262 Difficulty in walking, not elsewhere classified: Secondary | ICD-10-CM | POA: Diagnosis not present

## 2021-08-29 DIAGNOSIS — R252 Cramp and spasm: Secondary | ICD-10-CM | POA: Diagnosis not present

## 2021-08-29 DIAGNOSIS — M6281 Muscle weakness (generalized): Secondary | ICD-10-CM

## 2021-08-29 DIAGNOSIS — M542 Cervicalgia: Secondary | ICD-10-CM

## 2021-08-29 DIAGNOSIS — M5416 Radiculopathy, lumbar region: Secondary | ICD-10-CM

## 2021-08-29 NOTE — Therapy (Signed)
[place]. [place], [place], [address] Phone: [phone]   Fax:  [phone]  Physical Therapy Treatment  Patient Details  Name: Shannon Rosario MRN: [id number] Date of Birth: 1961/09/28 Referring Provider (PT): Dr Edmonia Lynch   Encounter Date: 08/29/2021   PT End of Session - 08/29/21 1145     Visit Number 5    Date for PT Re-Evaluation 09/29/21    PT Start Time 1111    PT Stop Time 1145    PT Time Calculation (min) 34 min    Activity Tolerance Patient tolerated treatment well    Behavior During Therapy [facility] for tasks assessed/performed             Past Medical History:  Diagnosis Date   Anxiety    Arthritis    Depression    Diabetes mellitus without complication ([place])    Family history of adverse reaction to anesthesia    My Mother had some complications,I dont remember what    Fibromyalgia    GERD (gastroesophageal reflux disease)    Heart murmur    Hypercholesterolemia    Hypertension    Obesity    OSA (obstructive sleep apnea)    TIA (transient ischemic attack) 12/24/2010    Past Surgical History:  Procedure Laterality Date   [place] Left 1995   LEFT HEART CATH AND CORONARY ANGIOGRAPHY N/A 03/16/2021   Procedure: LEFT HEART CATH AND CORONARY ANGIOGRAPHY;  Surgeon: Nigel Mormon, MD;  Location: [place] [facility];  Service: Cardiovascular;  Laterality: N/A;   [place]    There were no vitals filed for this visit.   Subjective Assessment - 08/29/21 1112     Subjective "Stomach has been killing me" R upper arm ai real tight and sore    Currently in Pain? Yes    Pain Score 6     Pain Location Shoulder    Pain Orientation Right;Upper                               [facility] Adult PT Treatment/Exercise - 08/29/21  0001       Neck Exercises: Machines for Strengthening   UBE (Upper Arm Bike) L1.7 x3 min each way    Cybex Row 20# 2x10    Lat Pull 20# 2x10      Neck Exercises: Standing   Neck Retraction 20 reps;3 secs    Other Standing Exercises ER red 2x10      Neck Exercises: Seated   Shoulder Flexion Both;20 reps;Weights    Shoulder Flexion Weights (lbs) 3    Shoulder ABduction Both;10 reps    Shoulder Abduction Weights (lbs) 3      Lumbar Exercises: Standing   Shoulder Extension Both;20 reps;Strengthening;Power Tower    Shoulder Extension Limitations 5                      PT Short Term Goals - 08/22/21 1328       PT SHORT TERM GOAL #1   Title Pt will be independent with initial HEP.    Status Achieved               PT Long Term Goals - 08/22/21 1328  PT LONG TERM GOAL #1   Title Pt will be independent with advanced HEP.    Status On-going      PT LONG TERM GOAL #2   Title Pt will verbalize/demonstrate understanding of proper posture and body mechanics to reduce strain on low back during daily tasks    Status On-going      PT LONG TERM GOAL #3   Title Pt will report >/= 25% improvement in cervicalgia and LBP for improved standing and walking tolerance    Status On-going      PT LONG TERM GOAL #4   Title Pt will demostrate increased in L hip and R shoulder strength to 4-/5 to 4/5 for improved proximal stability    Status On-going      PT LONG TERM GOAL #5   Title Pt will report ability to walk for 15-20 minutes w/o reliance on UE/UB support on grocery cart w/o increased LBP to improve tolerance for grocery shopping    Status On-going                   Plan - 08/29/21 1146     Clinical Impression Statement Pt ~ 11 minutes late for today's session. She did well overall tolerating a more UE concentrated treatment. Dome burning ins th UE reported with flexion and abduction. Increase resistance tolerated with seated rows and lats. No some R  shoulder posterior tightness reported with Er.    Personal Factors and Comorbidities Fitness    Examination-Activity Limitations Locomotion Level;Sleep;Stairs    Stability/Clinical Decision Making Evolving/Moderate complexity    Rehab Potential Good    PT Frequency 2x / week    PT Duration 8 weeks    PT Treatment/Interventions ADLs/Self Care Home Management;Cryotherapy;Electrical Stimulation;Iontophoresis '4mg'$ /ml Dexamethasone;Moist Heat;Traction;Ultrasound;Gait training;Stair training;Functional mobility training;Therapeutic activities;Therapeutic exercise;Balance training;Neuromuscular re-education;Patient/family education;Manual techniques;Passive range of motion;Dry needling;Taping;Spinal Manipulations;Joint Manipulations    PT Next Visit Plan DN if indicated, core stability, strengthening             Patient will benefit from skilled therapeutic intervention in order to improve the following deficits and impairments:  Difficulty walking, Decreased endurance, Increased muscle spasms, Obesity, Decreased activity tolerance, Pain, Impaired flexibility, Improper body mechanics, Decreased strength, Postural dysfunction  Visit Diagnosis: Lumbar radiculopathy  Cramp and spasm  Muscle weakness (generalized)  Cervicalgia     Problem List Patient Active Problem List   Diagnosis Date Noted   Angina pectoris, unstable ([place]) 08/11/2021   Non-restorative sleep 08/11/2021   Unstable angina ([place]) 03/16/2021   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   Fire ant bite 10/22/2016   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/2016   Allergic urticaria 10/22/2016   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 10/22/2016   Obstructive sleep apnea treated with continuous positive airway pressure (CPAP) 10/22/2016   Chest pain 09/21/2015   Essential hypertension 09/21/2015   Diabetes ([place]) 09/21/2015   Pain in the chest     Scot Jun 08/29/2021, 11:50 AM  [place]. [place], [place], [address] Phone: [phone]   Fax:  [phone]  Name: Shannon Rosario MRN: [id number] Date of Birth: 01/02/1961

## 2021-08-31 ENCOUNTER — Other Ambulatory Visit: Payer: Self-pay

## 2021-08-31 ENCOUNTER — Encounter: Payer: Self-pay | Admitting: Physical Therapy

## 2021-08-31 ENCOUNTER — Ambulatory Visit: Payer: Medicare Other | Admitting: Physical Therapy

## 2021-08-31 DIAGNOSIS — R262 Difficulty in walking, not elsewhere classified: Secondary | ICD-10-CM

## 2021-08-31 DIAGNOSIS — R252 Cramp and spasm: Secondary | ICD-10-CM | POA: Diagnosis not present

## 2021-08-31 DIAGNOSIS — M6281 Muscle weakness (generalized): Secondary | ICD-10-CM

## 2021-08-31 DIAGNOSIS — M542 Cervicalgia: Secondary | ICD-10-CM | POA: Diagnosis not present

## 2021-08-31 DIAGNOSIS — M5416 Radiculopathy, lumbar region: Secondary | ICD-10-CM | POA: Diagnosis not present

## 2021-08-31 NOTE — Therapy (Signed)
[place]. [place], [place], [address] Phone: [phone]   Fax:  [phone]  Physical Therapy Treatment  Patient Details  Name: Shannon Rosario MRN: [id number] Date of Birth: 02/11/1961 Referring Provider (PT): Dr Edmonia Lynch   Encounter Date: 08/31/2021   PT End of Session - 08/31/21 1141     Visit Number 6    Date for PT Re-Evaluation 09/29/21    PT Start Time 1102    PT Stop Time 1143    PT Time Calculation (min) 41 min    Activity Tolerance Patient tolerated treatment well    Behavior During Therapy [facility] for tasks assessed/performed             Past Medical History:  Diagnosis Date   Anxiety    Arthritis    Depression    Diabetes mellitus without complication ([place])    Family history of adverse reaction to anesthesia    My Mother had some complications,I dont remember what    Fibromyalgia    GERD (gastroesophageal reflux disease)    Heart murmur    Hypercholesterolemia    Hypertension    Obesity    OSA (obstructive sleep apnea)    TIA (transient ischemic attack) 12/24/2010    Past Surgical History:  Procedure Laterality Date   [place] Left 1995   LEFT HEART CATH AND CORONARY ANGIOGRAPHY N/A 03/16/2021   Procedure: LEFT HEART CATH AND CORONARY ANGIOGRAPHY;  Surgeon: Nigel Mormon, MD;  Location: [place] [facility];  Service: Cardiovascular;  Laterality: N/A;   [place]    There were no vitals filed for this visit.   Subjective Assessment - 08/31/21 1105     Subjective "R shoulder is tight"    Currently in Pain? No/denies                               [facility] Adult PT Treatment/Exercise - 08/31/21 0001       Neck Exercises: Machines for Strengthening   Nustep L4 x 4 min , L2 UE only x1 min    Cybex Row 25#  2x10    Lat Pull 25# 2x10      Neck Exercises: Standing   Neck Retraction 20 reps;3 secs   10 with ball on wall   Other Standing Exercises ER red 2x10      Lumbar Exercises: Machines for Strengthening   Cybex Lumbar Extension black band 2x10      Lumbar Exercises: Standing   Shoulder Extension Both;20 reps;Strengthening;Power Tower    Shoulder Extension Limitations 10    Other Standing Lumbar Exercises Ar press 15lb x10 each      Lumbar Exercises: Seated   Sit to Stand 20 reps   OHP yellow ball                      PT Short Term Goals - 08/22/21 1328       PT SHORT TERM GOAL #1   Title Pt will be independent with initial HEP.    Status Achieved               PT Long Term Goals - 08/22/21 1328       PT LONG  TERM GOAL #1   Title Pt will be independent with advanced HEP.    Status On-going      PT LONG TERM GOAL #2   Title Pt will verbalize/demonstrate understanding of proper posture and body mechanics to reduce strain on low back during daily tasks    Status On-going      PT LONG TERM GOAL #3   Title Pt will report >/= 25% improvement in cervicalgia and LBP for improved standing and walking tolerance    Status On-going      PT LONG TERM GOAL #4   Title Pt will demostrate increased in L hip and R shoulder strength to 4-/5 to 4/5 for improved proximal stability    Status On-going      PT LONG TERM GOAL #5   Title Pt will report ability to walk for 15-20 minutes w/o reliance on UE/UB support on grocery cart w/o increased LBP to improve tolerance for grocery shopping    Status On-going                   Plan - 08/31/21 1142     Clinical Impression Statement Some R shoulder soreness reported upon entering clinic. No reports of pain reported in today's session. increase resistance tolerated with rows, extensions and lat pull downs. Increase fatigue reported with sit to stands. Core weakness present with anti-rotational presses.    Personal  Factors and Comorbidities Fitness    Examination-Activity Limitations Locomotion Level;Sleep;Stairs    Examination-Participation Restrictions Community Activity    Stability/Clinical Decision Making Evolving/Moderate complexity    Rehab Potential Good    PT Frequency 2x / week    PT Duration 8 weeks    PT Treatment/Interventions ADLs/Self Care Home Management;Cryotherapy;Electrical Stimulation;Iontophoresis '4mg'$ /ml Dexamethasone;Moist Heat;Traction;Ultrasound;Gait training;Stair training;Functional mobility training;Therapeutic activities;Therapeutic exercise;Balance training;Neuromuscular re-education;Patient/family education;Manual techniques;Passive range of motion;Dry needling;Taping;Spinal Manipulations;Joint Manipulations    PT Next Visit Plan core stability, strengthening             Patient will benefit from skilled therapeutic intervention in order to improve the following deficits and impairments:  Difficulty walking, Decreased endurance, Increased muscle spasms, Obesity, Decreased activity tolerance, Pain, Impaired flexibility, Improper body mechanics, Decreased strength, Postural dysfunction  Visit Diagnosis: Lumbar radiculopathy  Cramp and spasm  Muscle weakness (generalized)  Cervicalgia  Difficulty in walking, not elsewhere classified     Problem List Patient Active Problem List   Diagnosis Date Noted   Angina pectoris, unstable ([place]) 08/11/2021   Non-restorative sleep 08/11/2021   Unstable angina ([place]) 03/16/2021   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   Fire ant bite 10/22/2016   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/2016   Allergic urticaria 10/22/2016   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 10/22/2016   Obstructive sleep apnea treated with continuous positive airway pressure (CPAP) 10/22/2016   Chest pain 09/21/2015   Essential hypertension 09/21/2015   Diabetes ([place]) 09/21/2015   Pain in the chest     Shannon Rosario, PTA 08/31/2021, 11:44 AM  [place]. [place], [place], [address] Phone: [phone]   Fax:  [phone]  Name: Shannon Rosario MRN: [id number] Date of Birth: 29-Dec-1960

## 2021-09-05 ENCOUNTER — Ambulatory Visit: Payer: Medicare Other | Admitting: Rehabilitative and Restorative Service Providers"

## 2021-09-05 ENCOUNTER — Encounter: Payer: Self-pay | Admitting: Rehabilitative and Restorative Service Providers"

## 2021-09-05 ENCOUNTER — Other Ambulatory Visit: Payer: Self-pay

## 2021-09-05 DIAGNOSIS — R262 Difficulty in walking, not elsewhere classified: Secondary | ICD-10-CM

## 2021-09-05 DIAGNOSIS — R252 Cramp and spasm: Secondary | ICD-10-CM | POA: Diagnosis not present

## 2021-09-05 DIAGNOSIS — M6281 Muscle weakness (generalized): Secondary | ICD-10-CM | POA: Diagnosis not present

## 2021-09-05 DIAGNOSIS — M542 Cervicalgia: Secondary | ICD-10-CM

## 2021-09-05 DIAGNOSIS — M5416 Radiculopathy, lumbar region: Secondary | ICD-10-CM

## 2021-09-05 NOTE — Therapy (Signed)
[place]. [place], [place], [address] Phone: [phone]   Fax:  [phone]  Physical Therapy Treatment  Patient Details  Name: Shannon Rosario MRN: [id number] Date of Birth: 1961-05-16 Referring Provider (PT): Dr Edmonia Lynch   Encounter Date: 09/05/2021   PT End of Session - 09/05/21 1114     Visit Number 7    Date for PT Re-Evaluation 09/29/21    PT Start Time 1107    PT Stop Time 1145    PT Time Calculation (min) 38 min    Activity Tolerance Patient tolerated treatment well    Behavior During Therapy [facility] for tasks assessed/performed             Past Medical History:  Diagnosis Date   Anxiety    Arthritis    Depression    Diabetes mellitus without complication ([place])    Family history of adverse reaction to anesthesia    My Mother had some complications,I dont remember what    Fibromyalgia    GERD (gastroesophageal reflux disease)    Heart murmur    Hypercholesterolemia    Hypertension    Obesity    OSA (obstructive sleep apnea)    TIA (transient ischemic attack) 12/24/2010    Past Surgical History:  Procedure Laterality Date   [place] Left 1995   LEFT HEART CATH AND CORONARY ANGIOGRAPHY N/A 03/16/2021   Procedure: LEFT HEART CATH AND CORONARY ANGIOGRAPHY;  Surgeon: Nigel Mormon, MD;  Location: [place] [facility];  Service: Cardiovascular;  Laterality: N/A;   [place]    There were no vitals filed for this visit.   Subjective Assessment - 09/05/21 1111     Subjective This weekend, my left thigh/hip was really sore and it was like I was dragging it.    Patient Stated Goals I want the pain to be gone.    Currently in Pain? Yes    Pain Score 8     Pain Location Shoulder    Pain Orientation Right;Upper    Pain Score 8    Pain  Location Back    Pain Orientation Left    Pain Descriptors / Indicators Radiating    Pain Type Chronic pain    Pain Radiating Towards into L hip                               [facility] Adult PT Treatment/Exercise - 09/05/21 0001       Neck Exercises: Machines for Strengthening   Nustep L4 x 4 min , L2 UE only x1 min    Cybex Row 25# 2x10    Lat Pull 25# 2x10      Neck Exercises: Standing   Neck Retraction 15 reps;3 secs    Other Standing Exercises ER red 2x10      Lumbar Exercises: Machines for Strengthening   Cybex Lumbar Extension black band 2x10      Lumbar Exercises: Standing   Shoulder Extension Both;20 reps;Strengthening;Power Tower    Shoulder Extension Limitations 10    Other Standing Lumbar Exercises AR press 15lb x10 each    Other Standing Lumbar Exercises Overhead shoulder ext and rotation with yellow wt ball x10 each  Lumbar Exercises: Seated   Sit to Stand 20 reps   OHP yellow wt ball     Manual Therapy   Manual Therapy Soft tissue mobilization;Myofascial release    Manual therapy comments seated    Soft tissue mobilization R upper trap and rhomboids, L lumbar paraspinals    Myofascial Release manual trigger point release to R upper trap trigger points.                       PT Short Term Goals - 08/22/21 1328       PT SHORT TERM GOAL #1   Title Pt will be independent with initial HEP.    Status Achieved               PT Long Term Goals - 09/05/21 1202       PT LONG TERM GOAL #1   Title Pt will be independent with advanced HEP.    Status On-going      PT LONG TERM GOAL #2   Title Pt will verbalize/demonstrate understanding of proper posture and body mechanics to reduce strain on low back during daily tasks    Status On-going      PT LONG TERM GOAL #3   Title Pt will report >/= 25% improvement in cervicalgia and LBP for improved standing and walking tolerance    Status On-going      PT LONG TERM GOAL #4    Title Pt will demostrate increased in L hip and R shoulder strength to 4-/5 to 4/5 for improved proximal stability    Status On-going      PT LONG TERM GOAL #5   Title Pt will report ability to walk for 15-20 minutes w/o reliance on UE/UB support on grocery cart w/o increased LBP to improve tolerance for grocery shopping    Status On-going                   Plan - 09/05/21 1200     Clinical Impression Statement Pt had reports of feeling R shoulder/cervical tightness today with increased trigger points noted in upper trap.  Pt reports feeling better and looser following manual therapy.  She tolerted ther ex well, but does require cuing for breathing throughout session.  She continues to have core weakness and requires cuing for tightening during ther ex.    PT Treatment/Interventions ADLs/Self Care Home Management;Cryotherapy;Electrical Stimulation;Iontophoresis '4mg'$ /ml Dexamethasone;Moist Heat;Traction;Ultrasound;Gait training;Stair training;Functional mobility training;Therapeutic activities;Therapeutic exercise;Balance training;Neuromuscular re-education;Patient/family education;Manual techniques;Passive range of motion;Dry needling;Taping;Spinal Manipulations;Joint Manipulations    PT Next Visit Plan core stability, strengthening    Consulted and Agree with Plan of Care Patient             Patient will benefit from skilled therapeutic intervention in order to improve the following deficits and impairments:  Difficulty walking, Decreased endurance, Increased muscle spasms, Obesity, Decreased activity tolerance, Pain, Impaired flexibility, Improper body mechanics, Decreased strength, Postural dysfunction  Visit Diagnosis: Lumbar radiculopathy  Cramp and spasm  Muscle weakness (generalized)  Cervicalgia  Difficulty in walking, not elsewhere classified     Problem List Patient Active Problem List   Diagnosis Date Noted   Angina pectoris, unstable ([place]) 08/11/2021    Non-restorative sleep 08/11/2021   Unstable angina ([place]) 03/16/2021   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   Fire ant bite 10/22/2016   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/2016   Allergic urticaria 10/22/2016   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 10/22/2016  Obstructive sleep apnea treated with continuous positive airway pressure (CPAP) 10/22/2016   Chest pain 09/21/2015   Essential hypertension 09/21/2015   Diabetes ([place]) 09/21/2015   Pain in the chest     Marilyn Wing, PT, DPT 09/05/2021, 12:03 PM  [place]. [place], [place], [address] Phone: [phone]   Fax:  [phone]  Name: Jamiyla Billie MRN: [id number] Date of Birth: Feb 15, 1961

## 2021-09-07 ENCOUNTER — Other Ambulatory Visit: Payer: Self-pay

## 2021-09-07 ENCOUNTER — Ambulatory Visit: Payer: Medicare Other | Admitting: Physical Therapy

## 2021-09-07 ENCOUNTER — Encounter: Payer: Self-pay | Admitting: Physical Therapy

## 2021-09-07 ENCOUNTER — Ambulatory Visit: Payer: Medicare Other | Admitting: Rehabilitative and Restorative Service Providers"

## 2021-09-07 DIAGNOSIS — R252 Cramp and spasm: Secondary | ICD-10-CM | POA: Diagnosis not present

## 2021-09-07 DIAGNOSIS — M542 Cervicalgia: Secondary | ICD-10-CM

## 2021-09-07 DIAGNOSIS — M6281 Muscle weakness (generalized): Secondary | ICD-10-CM

## 2021-09-07 DIAGNOSIS — R262 Difficulty in walking, not elsewhere classified: Secondary | ICD-10-CM | POA: Diagnosis not present

## 2021-09-07 DIAGNOSIS — M5416 Radiculopathy, lumbar region: Secondary | ICD-10-CM | POA: Diagnosis not present

## 2021-09-07 NOTE — Therapy (Signed)
[place]. [place], [place], [address] Phone: [phone]   Fax:  [phone]  Physical Therapy Treatment  Patient Details  Name: Shannon Rosario MRN: [id number] Date of Birth: 1961-09-14 Referring Provider (PT): Dr Edmonia Lynch   Encounter Date: 09/07/2021   PT End of Session - 09/07/21 0925     Visit Number 8    Date for PT Re-Evaluation 09/29/21    PT Start Time 0845    PT Stop Time 0928    PT Time Calculation (min) 43 min    Activity Tolerance Patient tolerated treatment well    Behavior During Therapy [facility] for tasks assessed/performed             Past Medical History:  Diagnosis Date   Anxiety    Arthritis    Depression    Diabetes mellitus without complication ([place])    Family history of adverse reaction to anesthesia    My Mother had some complications,I dont remember what    Fibromyalgia    GERD (gastroesophageal reflux disease)    Heart murmur    Hypercholesterolemia    Hypertension    Obesity    OSA (obstructive sleep apnea)    TIA (transient ischemic attack) 12/24/2010    Past Surgical History:  Procedure Laterality Date   [place] Left 1995   LEFT HEART CATH AND CORONARY ANGIOGRAPHY N/A 03/16/2021   Procedure: LEFT HEART CATH AND CORONARY ANGIOGRAPHY;  Surgeon: Nigel Mormon, MD;  Location: [place] [facility];  Service: Cardiovascular;  Laterality: N/A;   [place]    There were no vitals filed for this visit.   Subjective Assessment - 09/07/21 0852     Subjective "Im tired but Im ok, I been up since 5 am "    Currently in Pain? No/denies                               [facility] Adult PT Treatment/Exercise - 09/07/21 0001       Neck Exercises: Machines for Strengthening   UBE (Upper Arm Bike) L1.7  x3 min  each way    Cybex Row 25# 2x10    Lat Pull 25# 2x10      Neck Exercises: Standing   Neck Retraction 3 secs;10 reps   x2 ball against wall   Other Standing Exercises ER red 2x10    Other Standing Exercises Horiz Abd green 2x10      Lumbar Exercises: Standing   Row Strengthening;Both;20 reps    Row Limitations 10    Shoulder Extension Both;20 reps;Strengthening;Power Tower    Shoulder Extension Limitations 10    Other Standing Lumbar Exercises Tricep Ext 25lb 2x10                     PT Education - 09/07/21 0906     Education Details --    Person(s) Educated --    Methods --    Comprehension --              PT Short Term Goals - 08/22/21 1328       PT SHORT TERM GOAL #1   Title Pt will be independent with initial HEP.    Status Achieved  PT Long Term Goals - 09/05/21 1202       PT LONG TERM GOAL #1   Title Pt will be independent with advanced HEP.    Status On-going      PT LONG TERM GOAL #2   Title Pt will verbalize/demonstrate understanding of proper posture and body mechanics to reduce strain on low back during daily tasks    Status On-going      PT LONG TERM GOAL #3   Title Pt will report >/= 25% improvement in cervicalgia and LBP for improved standing and walking tolerance    Status On-going      PT LONG TERM GOAL #4   Title Pt will demostrate increased in L hip and R shoulder strength to 4-/5 to 4/5 for improved proximal stability    Status On-going      PT LONG TERM GOAL #5   Title Pt will report ability to walk for 15-20 minutes w/o reliance on UE/UB support on grocery cart w/o increased LBP to improve tolerance for grocery shopping    Status On-going                   Plan - 09/07/21 0926     Clinical Impression Statement Pt enters clinic reporting increase fatigue. Despite fatigue she did well with all interventions. Increase reps tolerated with seated rows and lats. postural core weakness noted with  standing rows. UR burning reported with horizontal abduction.    Personal Factors and Comorbidities Fitness    Examination-Activity Limitations Locomotion Level;Sleep;Stairs    Examination-Participation Restrictions Community Activity    Stability/Clinical Decision Making Evolving/Moderate complexity    Rehab Potential Good    PT Frequency 2x / week    PT Treatment/Interventions ADLs/Self Care Home Management;Cryotherapy;Electrical Stimulation;Iontophoresis '4mg'$ /ml Dexamethasone;Moist Heat;Traction;Ultrasound;Gait training;Stair training;Functional mobility training;Therapeutic activities;Therapeutic exercise;Balance training;Neuromuscular re-education;Patient/family education;Manual techniques;Passive range of motion;Dry needling;Taping;Spinal Manipulations;Joint Manipulations    PT Next Visit Plan core stability, strengthening             Patient will benefit from skilled therapeutic intervention in order to improve the following deficits and impairments:  Difficulty walking, Decreased endurance, Increased muscle spasms, Obesity, Decreased activity tolerance, Pain, Impaired flexibility, Improper body mechanics, Decreased strength, Postural dysfunction  Visit Diagnosis: Cramp and spasm  Lumbar radiculopathy  Muscle weakness (generalized)  Cervicalgia     Problem List Patient Active Problem List   Diagnosis Date Noted   Angina pectoris, unstable ([place]) 08/11/2021   Non-restorative sleep 08/11/2021   Unstable angina ([place]) 03/16/2021   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   Fire ant bite 10/22/2016   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/2016   Allergic urticaria 10/22/2016   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 10/22/2016   Obstructive sleep apnea treated with continuous positive airway pressure (CPAP) 10/22/2016   Chest pain 09/21/2015   Essential hypertension 09/21/2015   Diabetes ([place]) 09/21/2015   Pain in the chest     Scot Jun, PTA 09/07/2021, 9:29 AM  [place]. [place], [place], [address] Phone: [phone]   Fax:  [phone]  Name: Shannon Rosario MRN: [id number] Date of Birth: February 23, 1961

## 2021-09-12 ENCOUNTER — Ambulatory Visit: Payer: Medicare Other | Admitting: Physical Therapy

## 2021-09-12 ENCOUNTER — Other Ambulatory Visit: Payer: Self-pay

## 2021-09-12 DIAGNOSIS — M5416 Radiculopathy, lumbar region: Secondary | ICD-10-CM

## 2021-09-12 DIAGNOSIS — R262 Difficulty in walking, not elsewhere classified: Secondary | ICD-10-CM | POA: Diagnosis not present

## 2021-09-12 DIAGNOSIS — M6281 Muscle weakness (generalized): Secondary | ICD-10-CM

## 2021-09-12 DIAGNOSIS — M542 Cervicalgia: Secondary | ICD-10-CM

## 2021-09-12 DIAGNOSIS — R252 Cramp and spasm: Secondary | ICD-10-CM

## 2021-09-12 NOTE — Therapy (Signed)
[place]. [place], [place], [address] Phone: [phone]   Fax:  [phone]  Physical Therapy Treatment  Patient Details  Name: Shannon Rosario MRN: [id number] Date of Birth: [birth year] Referring Provider (PT): Dr Edmonia Lynch   Encounter Date: 09/12/2021   PT End of Session - 09/12/21 1222     Visit Number 9    Date for PT Re-Evaluation 09/29/21    PT Start Time 1149    PT Stop Time 1229    PT Time Calculation (min) 40 min             Past Medical History:  Diagnosis Date   Anxiety    Arthritis    Depression    Diabetes mellitus without complication ([place])    Family history of adverse reaction to anesthesia    My Mother had some complications,I dont remember what    Fibromyalgia    GERD (gastroesophageal reflux disease)    Heart murmur    Hypercholesterolemia    Hypertension    Obesity    OSA (obstructive sleep apnea)    TIA (transient ischemic attack) 12/24/2010    Past Surgical History:  Procedure Laterality Date   [place] Left 1995   LEFT HEART CATH AND CORONARY ANGIOGRAPHY N/A 03/16/2021   Procedure: LEFT HEART CATH AND CORONARY ANGIOGRAPHY;  Surgeon: Nigel Mormon, MD;  Location: [place] [facility];  Service: Cardiovascular;  Laterality: N/A;   [place]    There were no vitals filed for this visit.   Subjective Assessment - 09/12/21 1153     Subjective left LB and hip so tight. i was OOT at birthday party    Currently in Pain? Yes    Pain Score 9     Pain Location Back                               [facility] Adult PT Treatment/Exercise - 09/12/21 0001       Neck Exercises: Machines for Strengthening   UBE (Upper Arm Bike) L 2 2 min fwd/2 min backward    Cybex Row 25# 2x10    Lat Pull 25# 2x10       Lumbar Exercises: Standing   Other Standing Lumbar Exercises hip flex,ext and abd 10 x red tband      Lumbar Exercises: Seated   Other Seated Lumbar Exercises 4 way pelvic tilt on dynadisc x10 each    Other Seated Lumbar Exercises trunk ext blue tband 2 sets 10      Lumbar Exercises: Supine   Basic Lumbar Stabilization Limitations 10 min- bridge variations, clams, marching, ball squeeze      Manual Therapy   Manual Therapy Passive ROM    Passive ROM Left LE and trunk                       PT Short Term Goals - 08/22/21 1328       PT SHORT TERM GOAL #1   Title Pt will be independent with initial HEP.    Status Achieved               PT Long Term Goals - 09/05/21 1202  PT LONG TERM GOAL #1   Title Pt will be independent with advanced HEP.    Status On-going      PT LONG TERM GOAL #2   Title Pt will verbalize/demonstrate understanding of proper posture and body mechanics to reduce strain on low back during daily tasks    Status On-going      PT LONG TERM GOAL #3   Title Pt will report >/= 25% improvement in cervicalgia and LBP for improved standing and walking tolerance    Status On-going      PT LONG TERM GOAL #4   Title Pt will demostrate increased in L hip and R shoulder strength to 4-/5 to 4/5 for improved proximal stability    Status On-going      PT LONG TERM GOAL #5   Title Pt will report ability to walk for 15-20 minutes w/o reliance on UE/UB support on grocery cart w/o increased LBP to improve tolerance for grocery shopping    Status On-going                   Plan - 09/12/21 1223     Clinical Impression Statement pt arrived after being OOT and increased LB and LEft hip pain so focus ex for that- tolerated well but did need cuing, som eincreased pain with Left SLS.    PT Treatment/Interventions ADLs/Self Care Home Management;Cryotherapy;Electrical Stimulation;Iontophoresis 4mg /ml Dexamethasone;Moist  Heat;Traction;Ultrasound;Gait training;Stair training;Functional mobility training;Therapeutic activities;Therapeutic exercise;Balance training;Neuromuscular re-education;Patient/family education;Manual techniques;Passive range of motion;Dry needling;Taping;Spinal Manipulations;Joint Manipulations    PT Next Visit Plan check goals and 10th visit             Patient will benefit from skilled therapeutic intervention in order to improve the following deficits and impairments:  Difficulty walking, Decreased endurance, Increased muscle spasms, Obesity, Decreased activity tolerance, Pain, Impaired flexibility, Improper body mechanics, Decreased strength, Postural dysfunction  Visit Diagnosis: Lumbar radiculopathy  Muscle weakness (generalized)  Cramp and spasm  Cervicalgia     Problem List Patient Active Problem List   Diagnosis Date Noted   Angina pectoris, unstable ([place]) 08/11/2021   Non-restorative sleep 08/11/2021   Unstable angina ([place]) 03/16/2021   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   Fire ant bite 10/22/2016   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/2016   Allergic urticaria 10/22/2016   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 10/22/2016   Obstructive sleep apnea treated with continuous positive airway pressure (CPAP) 10/22/2016   Chest pain 09/21/2015   Essential hypertension 09/21/2015   Diabetes ([place]) 09/21/2015   Pain in the chest     Kainon Varady,ANGIE, PTA 09/12/2021, 12:27 PM  [place]. [place], [place], [address] Phone: [phone]   Fax:  [phone]  Name: Shannon Rosario MRN: [id number] Date of Birth: 12/09/1961

## 2021-09-14 ENCOUNTER — Other Ambulatory Visit: Payer: Self-pay

## 2021-09-14 ENCOUNTER — Ambulatory Visit: Payer: Medicare Other | Admitting: Physical Therapy

## 2021-09-14 ENCOUNTER — Encounter: Payer: Self-pay | Admitting: Physical Therapy

## 2021-09-14 DIAGNOSIS — R262 Difficulty in walking, not elsewhere classified: Secondary | ICD-10-CM | POA: Diagnosis not present

## 2021-09-14 DIAGNOSIS — M6281 Muscle weakness (generalized): Secondary | ICD-10-CM

## 2021-09-14 DIAGNOSIS — M5416 Radiculopathy, lumbar region: Secondary | ICD-10-CM | POA: Diagnosis not present

## 2021-09-14 DIAGNOSIS — R252 Cramp and spasm: Secondary | ICD-10-CM | POA: Diagnosis not present

## 2021-09-14 DIAGNOSIS — M542 Cervicalgia: Secondary | ICD-10-CM | POA: Diagnosis not present

## 2021-09-14 NOTE — Therapy (Signed)
[place]. [place], [place], [address] Phone: [phone]   Fax:  [phone] Progress Note Reporting Period 08/09/21 to 09/14/21 for the first 10 visits  See note below for Objective Data and Assessment of Progress/Goals.     Physical Therapy Treatment  Patient Details  Name: Shannon Rosario MRN: [id number] Date of Birth: 1961/10/26 Referring Provider (PT): Dr Edmonia Lynch   Encounter Date: 09/14/2021   PT End of Session - 09/14/21 1220     Visit Number 10    Date for PT Re-Evaluation 09/29/21    PT Start Time 1145    PT Stop Time 1225    PT Time Calculation (min) 40 min             Past Medical History:  Diagnosis Date   Anxiety    Arthritis    Depression    Diabetes mellitus without complication ([place])    Family history of adverse reaction to anesthesia    My Mother had some complications,I dont remember what    Fibromyalgia    GERD (gastroesophageal reflux disease)    Heart murmur    Hypercholesterolemia    Hypertension    Obesity    OSA (obstructive sleep apnea)    TIA (transient ischemic attack) 12/24/2010    Past Surgical History:  Procedure Laterality Date   [place] Left 1995   LEFT HEART CATH AND CORONARY ANGIOGRAPHY N/A 03/16/2021   Procedure: LEFT HEART CATH AND CORONARY ANGIOGRAPHY;  Surgeon: Nigel Mormon, MD;  Location: [place] [facility];  Service: Cardiovascular;  Laterality: N/A;   [place]    There were no vitals filed for this visit.   Subjective Assessment - 09/14/21 1147     Subjective "Im in pain" Pt reports pain in the L hip area and back, tightness in the R shoulder    Currently in Pain? Yes    Pain Score 10-Worst pain ever    Pain Location Hip    Pain Orientation Left                                [facility] Adult PT Treatment/Exercise - 09/14/21 0001       Neck Exercises: Machines for Strengthening   UBE (Upper Arm Bike) L 2 2 min fwd/2 min backward    Nustep L4 x 4 min    Cybex Row 25# 2x10    Lat Pull 35# 2x10      Lumbar Exercises: Stretches   Passive Hamstring Stretch Right;Left;4 reps;10 seconds;20 seconds    Single Knee to Chest Stretch Right;Left;3 reps;10 seconds    Lower Trunk Rotation 4 reps;10 seconds    ITB Stretch Right;Left;3 reps;10 seconds    Figure 4 Stretch 3 reps;20 seconds      Lumbar Exercises: Seated   Sit to Stand 20 reps   OHP yello wball     Lumbar Exercises: Supine   Bridge Compliant;20 reps;2 seconds                       PT Short Term Goals - 08/22/21 1328       PT SHORT TERM GOAL #1   Title Pt will be independent with initial  HEP.    Status Achieved               PT Long Term Goals - 09/14/21 1220       PT LONG TERM GOAL #1   Title Pt will be independent with advanced HEP.    Status Not Met      PT LONG TERM GOAL #2   Title Pt will verbalize/demonstrate understanding of proper posture and body mechanics to reduce strain on low back during daily tasks    Status Partially Met      PT LONG TERM GOAL #3   Title Pt will report >/= 25% improvement in cervicalgia and LBP for improved standing and walking tolerance    Status Partially Met      PT LONG TERM GOAL #4   Title Pt will demostrate increased in L hip and R shoulder strength to 4-/5 to 4/5 for improved proximal stability    Status On-going      PT LONG TERM GOAL #5   Title Pt will report ability to walk for 15-20 minutes w/o reliance on UE/UB support on grocery cart w/o increased LBP to improve tolerance for grocery shopping    Status Partially Met                   Plan - 09/14/21 1224     Clinical Impression Statement Pt continues to report increase L hip pain since retuning from her vacation in [place].  Despite high pain rating she completed all interventions without issue. increase resistance tolerated with seated rows and lats. Tactile cues for posture needed with standing shoulder extensions. Positive response to passive stretching. Tightness noted in L HS and piriformis muscles.    Personal Factors and Comorbidities Fitness    Examination-Activity Limitations Locomotion Level;Sleep;Stairs    Examination-Participation Restrictions Community Activity    Rehab Potential Good    PT Frequency 2x / week    PT Duration 8 weeks    PT Treatment/Interventions ADLs/Self Care Home Management;Cryotherapy;Electrical Stimulation;Iontophoresis 74m/ml Dexamethasone;Moist Heat;Traction;Ultrasound;Gait training;Stair training;Functional mobility training;Therapeutic activities;Therapeutic exercise;Balance training;Neuromuscular re-education;Patient/family education;Manual techniques;Passive range of motion;Dry needling;Taping;Spinal Manipulations;Joint Manipulations    PT Next Visit Plan assess and progress             Patient will benefit from skilled therapeutic intervention in order to improve the following deficits and impairments:  Difficulty walking, Decreased endurance, Increased muscle spasms, Obesity, Decreased activity tolerance, Pain, Impaired flexibility, Improper body mechanics, Decreased strength, Postural dysfunction  Visit Diagnosis: Lumbar radiculopathy  Muscle weakness (generalized)  Difficulty in walking, not elsewhere classified  Cervicalgia     Problem List Patient Active Problem List   Diagnosis Date Noted   Angina pectoris, unstable ([place] 08/11/2021   Non-restorative sleep 08/11/2021   Unstable angina ([place] 03/16/2021   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   Fire ant bite 10/22/2016   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/2016   Allergic urticaria 10/22/2016   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 10/22/2016   Obstructive sleep  apnea treated with continuous positive airway pressure (CPAP) 10/22/2016   Chest pain 09/21/2015   Essential hypertension 09/21/2015   Diabetes ([place] 09/21/2015   Pain in the chest     RScot Jun PTA 09/14/2021, 12:28 PM  [place] [address]Phone: [phone]  Fax:  [phone] Name: Shannon AllbrightMRN: [id number] of Birth: 102-Jan-1962

## 2021-09-19 ENCOUNTER — Ambulatory Visit: Payer: Medicare Other | Admitting: Physical Therapy

## 2021-09-21 ENCOUNTER — Ambulatory Visit: Payer: Medicare Other | Admitting: Physical Therapy

## 2021-09-21 ENCOUNTER — Encounter: Payer: Self-pay | Admitting: Physical Therapy

## 2021-09-21 ENCOUNTER — Other Ambulatory Visit: Payer: Self-pay

## 2021-09-21 DIAGNOSIS — M6281 Muscle weakness (generalized): Secondary | ICD-10-CM

## 2021-09-21 DIAGNOSIS — M5416 Radiculopathy, lumbar region: Secondary | ICD-10-CM

## 2021-09-21 DIAGNOSIS — R262 Difficulty in walking, not elsewhere classified: Secondary | ICD-10-CM

## 2021-09-21 DIAGNOSIS — R252 Cramp and spasm: Secondary | ICD-10-CM | POA: Diagnosis not present

## 2021-09-21 DIAGNOSIS — M542 Cervicalgia: Secondary | ICD-10-CM | POA: Diagnosis not present

## 2021-09-21 NOTE — Therapy (Signed)
[place]. [place], [place], [address] Phone: [phone]   Fax:  [phone]  Physical Therapy Treatment  Patient Details  Name: Shannon Rosario MRN: [id number] Date of Birth: 1961/02/21 Referring Provider (PT): Dr Edmonia Lynch   Encounter Date: 09/21/2021   PT End of Session - 09/21/21 1223     Visit Number 11    Date for PT Re-Evaluation 09/29/21    PT Start Time 1152    PT Stop Time 1227    PT Time Calculation (min) 35 min    Activity Tolerance Patient tolerated treatment well    Behavior During Therapy [facility] for tasks assessed/performed             Past Medical History:  Diagnosis Date   Anxiety    Arthritis    Depression    Diabetes mellitus without complication ([place])    Family history of adverse reaction to anesthesia    My Mother had some complications,I dont remember what    Fibromyalgia    GERD (gastroesophageal reflux disease)    Heart murmur    Hypercholesterolemia    Hypertension    Obesity    OSA (obstructive sleep apnea)    TIA (transient ischemic attack) 12/24/2010    Past Surgical History:  Procedure Laterality Date   [place] Left 1995   LEFT HEART CATH AND CORONARY ANGIOGRAPHY N/A 03/16/2021   Procedure: LEFT HEART CATH AND CORONARY ANGIOGRAPHY;  Surgeon: Nigel Mormon, MD;  Location: [facility];  Service: Cardiovascular;  Laterality: N/A;   [place]    There were no vitals filed for this visit.   Subjective Assessment - 09/21/21 1156     Subjective "Im ok" Shoulder is a little tight still    Currently in Pain? Yes    Pain Score 4     Pain Location Shoulder    Pain Orientation Left;Right                               [facility] Adult PT Treatment/Exercise - 09/21/21 0001       Neck  Exercises: Machines for Strengthening   UBE (Upper Arm Bike) L 2.7 3 min fwd/3 min backward    Cybex Row 35lb 2x10    Lat Pull 35# 2x10      Neck Exercises: Standing   Neck Retraction 20 reps;3 secs    Other Standing Exercises Shoulder Flex 4lb & abd 3lb 2x10      Lumbar Exercises: Standing   Row Strengthening;Both;20 reps    Row Limitations 15    Shoulder Extension Both;20 reps;Strengthening;Power Tower    Shoulder Extension Limitations 10                       PT Short Term Goals - 08/22/21 1328       PT SHORT TERM GOAL #1   Title Pt will be independent with initial HEP.    Status Achieved               PT Long Term Goals - 09/14/21 1220       PT LONG TERM GOAL #1   Title Pt will be independent with advanced HEP.  Status Not Met      PT LONG TERM GOAL #2   Title Pt will verbalize/demonstrate understanding of proper posture and body mechanics to reduce strain on low back during daily tasks    Status Partially Met      PT LONG TERM GOAL #3   Title Pt will report >/= 25% improvement in cervicalgia and LBP for improved standing and walking tolerance    Status Partially Met      PT LONG TERM GOAL #4   Title Pt will demostrate increased in L hip and R shoulder strength to 4-/5 to 4/5 for improved proximal stability    Status On-going      PT LONG TERM GOAL #5   Title Pt will report ability to walk for 15-20 minutes w/o reliance on UE/UB support on grocery cart w/o increased LBP to improve tolerance for grocery shopping    Status Partially Met                   Plan - 09/21/21 1224     Clinical Impression Statement Pt ~ 7 minutes late for today's session. increase fatigue noted throughout today's session. Postural weakness present with standing shoulder extensions and rows, requiring cues to correct. Some difficulty with flexion and abduction secondary to fatigue.    Personal Factors and Comorbidities Fitness    Examination-Activity  Limitations Locomotion Level;Sleep;Stairs    Examination-Participation Restrictions Community Activity    Stability/Clinical Decision Making Evolving/Moderate complexity    Rehab Potential Good    PT Duration 8 weeks    PT Treatment/Interventions ADLs/Self Care Home Management;Cryotherapy;Electrical Stimulation;Iontophoresis 16m/ml Dexamethasone;Moist Heat;Traction;Ultrasound;Gait training;Stair training;Functional mobility training;Therapeutic activities;Therapeutic exercise;Balance training;Neuromuscular re-education;Patient/family education;Manual techniques;Passive range of motion;Dry needling;Taping;Spinal Manipulations;Joint Manipulations    PT Next Visit Plan assess and progress             Patient will benefit from skilled therapeutic intervention in order to improve the following deficits and impairments:  Difficulty walking, Decreased endurance, Increased muscle spasms, Obesity, Decreased activity tolerance, Pain, Impaired flexibility, Improper body mechanics, Decreased strength, Postural dysfunction  Visit Diagnosis: Muscle weakness (generalized)  Difficulty in walking, not elsewhere classified  Cervicalgia  Lumbar radiculopathy     Problem List Patient Active Problem List   Diagnosis Date Noted   Angina pectoris, unstable ([place] 08/11/2021   Non-restorative sleep 08/11/2021   Unstable angina ([place] 03/16/2021   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   Fire ant bite 10/22/2016   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/2016   Allergic urticaria 10/22/2016   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 10/22/2016   Obstructive sleep apnea treated with continuous positive airway pressure (CPAP) 10/22/2016   Chest pain 09/21/2015   Essential hypertension 09/21/2015   Diabetes ([place] 09/21/2015   Pain in the chest     RScot Jun PTA 09/21/2021, 12:28 PM  [place] [address]Phone: [phone]  Fax:  [phone] Name: Shannon WeingartenMRN: [id number] of Birth: [birth year]

## 2021-09-26 ENCOUNTER — Other Ambulatory Visit: Payer: Self-pay

## 2021-09-26 ENCOUNTER — Encounter: Payer: Self-pay | Admitting: Rehabilitative and Restorative Service Providers"

## 2021-09-26 ENCOUNTER — Ambulatory Visit: Payer: Medicare Other | Attending: Orthopedic Surgery | Admitting: Rehabilitative and Restorative Service Providers"

## 2021-09-26 DIAGNOSIS — R262 Difficulty in walking, not elsewhere classified: Secondary | ICD-10-CM

## 2021-09-26 DIAGNOSIS — M5416 Radiculopathy, lumbar region: Secondary | ICD-10-CM

## 2021-09-26 DIAGNOSIS — M6281 Muscle weakness (generalized): Secondary | ICD-10-CM

## 2021-09-26 DIAGNOSIS — M542 Cervicalgia: Secondary | ICD-10-CM | POA: Diagnosis not present

## 2021-09-26 DIAGNOSIS — R252 Cramp and spasm: Secondary | ICD-10-CM

## 2021-09-26 NOTE — Therapy (Signed)
[place]. [place], [place], [address] Phone: [phone]   Fax:  [phone]  Physical Therapy Treatment  Patient Details  Name: Shannon Rosario MRN: [id number] Date of Birth: May 10, 1961 Referring Provider (PT): Dr Edmonia Lynch   Encounter Date: 09/26/2021   PT End of Session - 09/26/21 1158     Visit Number 12    PT Start Time 0321    PT Stop Time 1225    PT Time Calculation (min) 40 min    Activity Tolerance Patient tolerated treatment well    Behavior During Therapy [facility] for tasks assessed/performed             Past Medical History:  Diagnosis Date   Anxiety    Arthritis    Depression    Diabetes mellitus without complication ([place])    Family history of adverse reaction to anesthesia    My Mother had some complications,I dont remember what    Fibromyalgia    GERD (gastroesophageal reflux disease)    Heart murmur    Hypercholesterolemia    Hypertension    Obesity    OSA (obstructive sleep apnea)    TIA (transient ischemic attack) 12/24/2010    Past Surgical History:  Procedure Laterality Date   [place] Left 1995   LEFT HEART CATH AND CORONARY ANGIOGRAPHY N/A 03/16/2021   Procedure: LEFT HEART CATH AND CORONARY ANGIOGRAPHY;  Surgeon: Nigel Mormon, MD;  Location: [facility];  Service: Cardiovascular;  Laterality: N/A;   [place]    There were no vitals filed for this visit.   Subjective Assessment - 09/26/21 1157     Subjective Pt reports that she is okay    Patient Stated Goals I want the pain to be gone.    Currently in Pain? Yes    Pain Score 3     Pain Location Shoulder    Pain Score 4    Pain Location Back                [facility] PT Assessment - 09/26/21 0001       Assessment   Medical Diagnosis Cervicalgia,  Lumbar Radiculopathy    Referring Provider (PT) Dr Edmonia Lynch    Hand Dominance Right      Precautions   Precautions None      Restrictions   Weight Bearing Restrictions No      Balance Screen   Has the patient fallen in the past 6 months No      [place] residence    Living Arrangements Spouse/significant other    Type of [place] to enter    Home Layout Two level;Bed/bath upstairs      Prior Function   Level of Independence Independent    Vocation On disability    Museum/gallery curator    Leisure color/drawing, puzzles, play cards, reading, go to the movies      Cognition   Overall Cognitive Status Within Functional Limits for tasks assessed      Observation/Other Assessments   Focus on Therapeutic Outcomes (FOTO)  52%  [facility] Adult PT Treatment/Exercise - 09/26/21 0001       Transfers   Five time sit to stand comments  11.6 sec without UE      Neck Exercises: Machines for Strengthening   UBE (Upper Arm Bike) L 2.7 3 min fwd/3 min backward    Cybex Row 35lb 2x10    Lat Pull 35# 2x10      Neck Exercises: Standing   Neck Retraction 20 reps;3 secs    Other Standing Exercises Shoulder Flex 4lb & abd 3lb 2x10      Manual Therapy   Manual Therapy Soft tissue mobilization;Myofascial release    Manual therapy comments seated    Soft tissue mobilization R upper trap and Rhomoids    Myofascial Release DN to R upper trap and R Rhomboids              Trigger Point Dry Needling - 09/26/21 0001     Consent Given? Yes    Muscles Treated Head and Neck Upper trapezius    Muscles Treated Upper Quadrant Rhomboids    Upper Trapezius Response Twitch reponse elicited    Rhomboids Response Twitch response elicited                     PT Short Term Goals - 08/22/21 1328       PT SHORT TERM GOAL #1   Title Pt will be independent with initial  HEP.    Status Achieved               PT Long Term Goals - 09/26/21 1319       PT LONG TERM GOAL #1   Title Pt will be independent with advanced HEP.    Time 8    Period Weeks    Status On-going      PT LONG TERM GOAL #2   Title Pt will verbalize/demonstrate understanding of proper posture and body mechanics to reduce strain on low back during daily tasks    Status Partially Met      PT LONG TERM GOAL #3   Title Pt will report >/= 25% improvement in cervicalgia and LBP for improved standing and walking tolerance    Status Partially Met      PT LONG TERM GOAL #4   Title Pt will demostrate increased in L hip and R shoulder strength to 4-/5 to 4/5 for improved proximal stability    Status On-going      PT LONG TERM GOAL #5   Title Pt will report ability to walk for 15-20 minutes w/o reliance on UE/UB support on grocery cart w/o increased LBP to improve tolerance for grocery shopping    Status Partially Met                   Plan - 09/26/21 1226     Clinical Impression Statement Pt is progressing towards goal related activities.  She has met her projected FOTO score, but she continues to have some cervical pain radiating down RUE with trigger points noted. Pt agreeable to trying DN again to attempt to relax her trigger points.  Following DN, pt with pain decreased to 0/10 in cervical and UE region. Pt continues to progress with ther ex and requires less cuing for posture.  Pt would benefit from continued skilled outpatient PT of 1-2x/week for 4 additional weeks to continue to progress towards increased strength and decreased pain.    Personal Factors and Comorbidities Fitness  Examination-Activity Limitations Locomotion Level;Sleep;Stairs    Examination-Participation Restrictions Community Activity    Stability/Clinical Decision Making Evolving/Moderate complexity    Clinical Decision Making Moderate    Rehab Potential Good    PT Frequency 2x / week    PT  Duration 8 weeks    PT Treatment/Interventions ADLs/Self Care Home Management;Cryotherapy;Electrical Stimulation;Iontophoresis 15m/ml Dexamethasone;Moist Heat;Traction;Ultrasound;Gait training;Stair training;Functional mobility training;Therapeutic activities;Therapeutic exercise;Balance training;Neuromuscular re-education;Patient/family education;Manual techniques;Passive range of motion;Dry needling;Taping;Spinal Manipulations;Joint Manipulations    PT Next Visit Plan assess and progress, strengthening, core stability.  Modalities and manual therapy as indicated.    Consulted and Agree with Plan of Care Patient             Patient will benefit from skilled therapeutic intervention in order to improve the following deficits and impairments:  Difficulty walking, Decreased endurance, Increased muscle spasms, Obesity, Decreased activity tolerance, Pain, Impaired flexibility, Improper body mechanics, Decreased strength, Postural dysfunction  Visit Diagnosis: Muscle weakness (generalized) - Plan: PT plan of care cert/re-cert  Difficulty in walking, not elsewhere classified - Plan: PT plan of care cert/re-cert  Cervicalgia - Plan: PT plan of care cert/re-cert  Lumbar radiculopathy - Plan: PT plan of care cert/re-cert  Cramp and spasm - Plan: PT plan of care cert/re-cert     Problem List Patient Active Problem List   Diagnosis Date Noted   Angina pectoris, unstable ([place] 08/11/2021   Non-restorative sleep 08/11/2021   Unstable angina ([place] 03/16/2021   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   Fire ant bite 10/22/2016   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/2016   Allergic urticaria 10/22/2016   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 10/22/2016   Obstructive sleep apnea treated with continuous positive airway pressure (CPAP) 10/22/2016   Chest pain 09/21/2015   Essential hypertension 09/21/2015   Diabetes ([place] 09/21/2015   Pain in the chest      SJuel Burrow PT, DPT 09/26/2021, 1:24 PM  [place] [address]Phone: [phone]  Fax:  [phone] Name: MTonyia MarschallMRN: [id number] of Birth: [birth year]

## 2021-09-28 ENCOUNTER — Ambulatory Visit: Payer: Medicare Other | Admitting: Rehabilitative and Restorative Service Providers"

## 2021-10-03 ENCOUNTER — Encounter: Payer: Self-pay | Admitting: Physical Therapy

## 2021-10-03 ENCOUNTER — Ambulatory Visit: Payer: Medicare Other | Admitting: Physical Therapy

## 2021-10-03 ENCOUNTER — Other Ambulatory Visit: Payer: Self-pay

## 2021-10-03 DIAGNOSIS — R252 Cramp and spasm: Secondary | ICD-10-CM | POA: Diagnosis not present

## 2021-10-03 DIAGNOSIS — M542 Cervicalgia: Secondary | ICD-10-CM | POA: Diagnosis not present

## 2021-10-03 DIAGNOSIS — M6281 Muscle weakness (generalized): Secondary | ICD-10-CM

## 2021-10-03 DIAGNOSIS — R262 Difficulty in walking, not elsewhere classified: Secondary | ICD-10-CM

## 2021-10-03 DIAGNOSIS — M5416 Radiculopathy, lumbar region: Secondary | ICD-10-CM

## 2021-10-03 NOTE — Therapy (Signed)
[place]. [place], [place], [address] Phone: [phone]   Fax:  [phone]  Physical Therapy Treatment  Patient Details  Name: Shannon Rosario MRN: [id number] Date of Birth: [birth year] Referring Provider (PT): Dr Edmonia Lynch   Encounter Date: 10/03/2021   PT End of Session - 10/03/21 1136     Visit Number 13    Date for PT Re-Evaluation 11/03/21    PT Start Time 1100    PT Stop Time 1148    PT Time Calculation (min) 48 min    Activity Tolerance Patient tolerated treatment well    Behavior During Therapy [facility] for tasks assessed/performed             Past Medical History:  Diagnosis Date   Anxiety    Arthritis    Depression    Diabetes mellitus without complication ([place])    Family history of adverse reaction to anesthesia    My Mother had some complications,I dont remember what    Fibromyalgia    GERD (gastroesophageal reflux disease)    Heart murmur    Hypercholesterolemia    Hypertension    Obesity    OSA (obstructive sleep apnea)    TIA (transient ischemic attack) 12/24/2010    Past Surgical History:  Procedure Laterality Date   [place] Left 1995   LEFT HEART CATH AND CORONARY ANGIOGRAPHY N/A 03/16/2021   Procedure: LEFT HEART CATH AND CORONARY ANGIOGRAPHY;  Surgeon: Nigel Mormon, MD;  Location: [facility];  Service: Cardiovascular;  Laterality: N/A;   [place]    There were no vitals filed for this visit.   Subjective Assessment - 10/03/21 1103     Subjective Patient reports that after the last time she was a little worse, tight in the neck and back    Currently in Pain? Yes    Pain Score 4     Pain Location Neck    Pain Descriptors / Indicators Sharp    Aggravating Factors  some movements turing head or reaching  with the right arm, pain can shoot up to 10/10                               [facility] Adult PT Treatment/Exercise - 10/03/21 0001       Manual Therapy   Manual Therapy Soft tissue mobilization;Myofascial release    Manual therapy comments seated    Soft tissue mobilization R upper trap and Rhomoids and into the right cervical area    Passive ROM upper trap and levator stretches              Trigger Point Dry Needling - 10/03/21 0001     Consent Given? Yes    Muscles Treated Head and Neck Upper trapezius    Upper Trapezius Response Twitch reponse elicited;Palpable increased muscle length                     PT Short Term Goals - 08/22/21 1328       PT SHORT TERM GOAL #1   Title Pt will be independent with initial HEP.    Status Achieved  PT Long Term Goals - 10/03/21 1139       PT LONG TERM GOAL #1   Title Pt will be independent with advanced HEP.    Status Partially Met      PT LONG TERM GOAL #2   Title Pt will verbalize/demonstrate understanding of proper posture and body mechanics to reduce strain on low back during daily tasks    Status Partially Met      PT LONG TERM GOAL #3   Title Pt will report >/= 25% improvement in cervicalgia and LBP for improved standing and walking tolerance    Status Partially Met                   Plan - 10/03/21 1137     Clinical Impression Statement Patient reports that she was going to cancel today due to so much pain and discomfort, she is very tight in the right upper trap, neck and rhomboid, she was very tender here today, I tried some passive stretch of the upper trap and the levator.  I did more STM and added the estim for the pain and the tightness    PT Next Visit Plan assess and progress, strengthening, core stability.  Modalities and manual therapy as indicated.    Consulted and Agree with Plan of Care Patient             Patient will benefit from skilled  therapeutic intervention in order to improve the following deficits and impairments:  Difficulty walking, Decreased endurance, Increased muscle spasms, Obesity, Decreased activity tolerance, Pain, Impaired flexibility, Improper body mechanics, Decreased strength, Postural dysfunction  Visit Diagnosis: Muscle weakness (generalized)  Difficulty in walking, not elsewhere classified  Cervicalgia  Lumbar radiculopathy  Cramp and spasm     Problem List Patient Active Problem List   Diagnosis Date Noted   Angina pectoris, unstable ([place]) 08/11/2021   Non-restorative sleep 08/11/2021   Unstable angina ([place]) 03/16/2021   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   Fire ant bite 10/22/2016   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/2016   Allergic urticaria 10/22/2016   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 10/22/2016   Obstructive sleep apnea treated with continuous positive airway pressure (CPAP) 10/22/2016   Chest pain 09/21/2015   Essential hypertension 09/21/2015   Diabetes ([place]) 09/21/2015   Pain in the chest     Sumner Boast, PT 10/03/2021, 11:40 AM  [place]. [place], [place], [address] Phone: [phone]   Fax:  [phone]  Name: Lloyd Cullinan MRN: [id number] Date of Birth: 1961/01/02

## 2021-10-04 DIAGNOSIS — M545 Low back pain, unspecified: Secondary | ICD-10-CM | POA: Diagnosis not present

## 2021-10-04 DIAGNOSIS — M542 Cervicalgia: Secondary | ICD-10-CM | POA: Diagnosis not present

## 2021-10-05 ENCOUNTER — Other Ambulatory Visit: Payer: Self-pay

## 2021-10-05 ENCOUNTER — Encounter: Payer: Self-pay | Admitting: Cardiology

## 2021-10-05 ENCOUNTER — Ambulatory Visit: Payer: Medicare Other | Admitting: Cardiology

## 2021-10-05 VITALS — BP 126/90 | HR 109 | Temp 98.0°F | Resp 16 | Ht 68.0 in | Wt 263.0 lb

## 2021-10-05 DIAGNOSIS — E1165 Type 2 diabetes mellitus with hyperglycemia: Secondary | ICD-10-CM

## 2021-10-05 DIAGNOSIS — I10 Essential (primary) hypertension: Secondary | ICD-10-CM

## 2021-10-05 DIAGNOSIS — Z8673 Personal history of transient ischemic attack (TIA), and cerebral infarction without residual deficits: Secondary | ICD-10-CM

## 2021-10-05 DIAGNOSIS — E782 Mixed hyperlipidemia: Secondary | ICD-10-CM | POA: Diagnosis not present

## 2021-10-05 DIAGNOSIS — I2510 Atherosclerotic heart disease of native coronary artery without angina pectoris: Secondary | ICD-10-CM | POA: Diagnosis not present

## 2021-10-05 DIAGNOSIS — Z6839 Body mass index (BMI) 39.0-39.9, adult: Secondary | ICD-10-CM

## 2021-10-05 MED ORDER — LOSARTAN POTASSIUM 25 MG PO TABS: 25.0000 mg | ORAL_TABLET | Freq: Every evening | ORAL | 0 refills | Status: DC

## 2021-10-05 MED ORDER — METOPROLOL SUCCINATE ER 25 MG PO TB24: 25.0000 mg | ORAL_TABLET | Freq: Every day | ORAL | 0 refills | Status: AC

## 2021-10-05 MED ORDER — ASPIRIN EC 81 MG PO TBEC: 81.0000 mg | DELAYED_RELEASE_TABLET | Freq: Every day | ORAL | 11 refills | Status: AC

## 2021-10-05 NOTE — Progress Notes (Signed)
ID:  Shannon Rosario, DOB Jan 11, 1961, MRN [id number]  PCP:  Benito Mccreedy, MD  Cardiologist:  Rex Kras, DO, [facility] (established care 03/14/2021) Former Cardiology Providers: Dr. Hardie Lora ([place], [place])  Date: 10/05/21 Last Office Visit: 04/06/2021   Chief Complaint  Patient presents with   Nonobstructive atherosclerosis of coronary artery   Follow-up    6 month    HPI  Shannon Rosario is a 60 y.o. female who presents to the office with a chief complaint of " 62-monthfollow-up for nonobstructive CAD management." Patient's past medical history and cardiovascular risk factors include: hypertension, Hx of TIA, Non-insulin-dependent diabetes mellitus type 2, hyperlipidemia, obesity due to excess calories, family history of premature CAD, postmenopausal female.  She is referred to the office at the request of Osei-Bonsu, GIona Beard MD for evaluation of chest pain.  During initial consultation her symptoms of chest pain were very suggestive of angina pectoris she was scheduled for outpatient work-up.  However presented to the ED for symptoms of unstable angina and underwent left heart catheterization and noted nonobstructive CAD.  The 80% eccentric stenosis in the mid second obtuse marginal branch was recommended to be treated medically as the lumen caliber was noted to be small in size.  Since then her antianginal therapy has been uptitrated and she is remained symptom-free.  She presents for 669-monthollow-up.  Over the last 6 months patient states that she has not had any chest pain.  No use of sublingual nitroglycerin tablets.  However, patient states that she has stopped taking her blood pressure medications as it was making her lethargic.  And she also also ran out of her beta-blocker therapy.  No hospitalizations or urgent care visits for cardiovascular symptoms.  Since last visit she has been evaluated by sleep medicine and it was informed that she did not  have sleep apnea.   FUNCTIONAL STATUS: No structured exercise program or daily routine.   ALLERGIES: Allergies  Allergen Reactions   Bee Venom Anaphylaxis   Pineapple Swelling    Reports it makes her throat swell   Dobutamine Hypertension    MEDICATION LIST PRIOR TO VISIT: Current Meds  Medication Sig   amLODipine (NORVASC) 10 MG tablet Take 1 tablet by mouth daily.   aspirin EC 81 MG tablet Take 1 tablet (81 mg total) by mouth daily. Swallow whole.   cholecalciferol (VITAMIN D) 1000 UNITS tablet Take 1,000 Units by mouth daily.   cycloSPORINE (RESTASIS) 0.05 % ophthalmic emulsion 2 (two) times daily.   EPINEPHrine 0.3 mg/0.3 mL IJ SOAJ injection Inject 0.3 mLs (0.3 mg total) into the muscle once as needed (anaphylaxis).   gabapentin (NEURONTIN) 300 MG capsule Take 300 mg by mouth 2 (two) times daily as needed.   glimepiride (AMARYL) 4 MG tablet Take 4 mg by mouth in the morning and at bedtime.   levocetirizine (XYZAL) 5 MG tablet Take 5 mg by mouth daily.   losartan (COZAAR) 25 MG tablet Take 1 tablet (25 mg total) by mouth every evening.   metFORMIN (GLUCOPHAGE) 500 MG tablet Take 2 tablets by mouth 2 (two) times daily.   nitroGLYCERIN (NITROSTAT) 0.4 MG SL tablet TAKE 1 TAB SUBLINGUALLY EVERY 5 MINUTES AS NEEDED   omeprazole (PRILOSEC) 20 MG capsule Take 20 mg by mouth daily.   rosuvastatin (CRESTOR) 10 MG tablet Take 10 mg by mouth daily.   TRULICITY 1.5 MGZJ/6.7HAOPN Inject 1.5 mg into the skin once a week.   vitamin E 1000 UNIT capsule Take 1,000  Units by mouth daily.   [DISCONTINUED] aspirin 325 MG tablet Take 325 mg by mouth daily.   [DISCONTINUED] losartan-hydrochlorothiazide (HYZAAR) 50-12.5 MG tablet Take 1 tablet by mouth daily.     PAST MEDICAL HISTORY: Past Medical History:  Diagnosis Date   Anxiety    Arthritis    Depression    Diabetes mellitus without complication (HCC)    Family history of adverse reaction to anesthesia    My Mother had some  complications,I dont remember what    Fibromyalgia    GERD (gastroesophageal reflux disease)    Heart murmur    Hypercholesterolemia    Hypertension    Obesity    OSA (obstructive sleep apnea)    TIA (transient ischemic attack) 12/24/2010    PAST SURGICAL HISTORY: Past Surgical History:  Procedure Laterality Date   APPENDECTOMY     CARPAL TUNNEL RELEASE  [place] Left 1995   LEFT HEART CATH AND CORONARY ANGIOGRAPHY N/A 03/16/2021   Procedure: LEFT HEART CATH AND CORONARY ANGIOGRAPHY;  Surgeon: Nigel Mormon, MD;  Location: [facility];  Service: Cardiovascular;  Laterality: N/A;   NASAL SINUS SURGERY  1992   TONSILLECTOMY  1968    FAMILY HISTORY: The patient family history includes Allergic rhinitis in her daughter, father, grandchild, grandchild, and mother; Asthma in her mother; Bronchitis in her daughter, grandchild, grandchild, and sister; CAD in her mother; Dementia in her father and mother; Diabetes in her mother; Eczema in her grandchild and sister; Food Allergy in her daughter; Hypertension in her mother; Lupus in her mother; Other in her mother; Sleep apnea in her cousin and sister; Stroke in her mother; Urticaria in her grandchild.  SOCIAL HISTORY:  The patient  reports that she has never smoked. She has been exposed to tobacco smoke. She has never used smokeless tobacco. She reports that she does not currently use alcohol. She reports that she does not use drugs.  REVIEW OF SYSTEMS: Review of Systems  Constitutional: Positive for malaise/fatigue (better). Negative for chills and fever.  HENT:  Negative for hoarse voice and nosebleeds.   Eyes:  Negative for discharge, double vision and pain.  Cardiovascular:  Negative for chest pain, claudication, dyspnea on exertion, leg swelling, near-syncope, orthopnea, palpitations, paroxysmal nocturnal dyspnea and syncope.  Respiratory:  Negative for  hemoptysis and shortness of breath.   Musculoskeletal:  Negative for muscle cramps and myalgias.  Gastrointestinal:  Negative for abdominal pain, constipation, diarrhea, hematemesis, hematochezia, melena, nausea and vomiting.  Neurological:  Negative for dizziness and light-headedness.  PHYSICAL EXAM: Vitals with BMI 10/05/2021 06/01/2021 04/06/2021  Height 5' 8"  5' 8"  -  Weight 263 lbs 264 lbs -  BMI 40 17.51 -  Systolic 025 852 778  Diastolic 90 88 96  Pulse 242 95 99   CONSTITUTIONAL: Well-developed and well-nourished. No acute distress.  SKIN: Skin is warm and dry. No rash noted. No cyanosis. No pallor. No jaundice HEAD: Normocephalic and atraumatic.  EYES: No scleral icterus MOUTH/THROAT: Moist oral membranes.  NECK: No JVD present. No thyromegaly noted. No carotid bruits  LYMPHATIC: No visible cervical adenopathy.  CHEST Normal respiratory effort. No intercostal retractions  LUNGS: Clear to auscultation bilaterally.  No stridor. No wheezes. No rales.  CARDIOVASCULAR: Regular rate and rhythm, positive S1-S2, no murmurs rubs or gallops appreciated. ABDOMINAL: No apparent ascites.  EXTREMITIES: No peripheral edema  HEMATOLOGIC: No significant bruising NEUROLOGIC: Oriented to person,  place, and time. Nonfocal. Normal muscle tone.  PSYCHIATRIC: Normal mood and affect. Normal behavior. Cooperative  CARDIAC DATABASE: EKG: 10/05/2021: Normal sinus rhythm, 98 bpm, low voltage in the precordial leads, old inferior infarct, poor R wave progression, without underlying injury pattern.    Echocardiogram: 09/21/2015:  LVEF 60-65%, no regional wall motion abnormalities, grade 1 diastolic impairment, mildly dilated left atrium.  Stress Testing: No results found for this or any previous visit from the past 1095 days.  Heart Catheterization: 03/16/2021:  LM: Normal LAD: Prox 20%, mid 30% stenoses LCx: Small caliber OM2 with mid eccentric 80% stenosis RCA: Mid 20% disease LVEDP 11  mmHg   LABORATORY DATA: CBC Latest Ref Rng & Units 03/16/2021 03/15/2021 09/30/2016  WBC 4.0 - 10.5 K/uL 9.9 8.8 8.1  Hemoglobin 12.0 - 15.0 g/dL 12.0 12.8 12.3  Hematocrit 36.0 - 46.0 % 37.4 38.4 37.4  Platelets 150 - 400 K/uL 287 328 243    CMP Latest Ref Rng & Units 03/16/2021 03/15/2021 09/30/2016  Glucose 70 - 99 mg/dL 120(H) 122(H) 278(H)  BUN 6 - 20 mg/dL 8 9 11   Creatinine 0.44 - 1.00 mg/dL 0.91 0.85 0.83  Sodium 135 - 145 mmol/L 140 140 139  Potassium 3.5 - 5.1 mmol/L 3.7 3.7 3.3(L)  Chloride 98 - 111 mmol/L 106 108 106  CO2 22 - 32 mmol/L 24 24 25   Calcium 8.9 - 10.3 mg/dL 8.8(L) 8.9 8.8(L)  Total Protein 6.5 - 8.1 g/dL - - 6.5  Total Bilirubin 0.3 - 1.2 mg/dL - - 0.4  Alkaline Phos 38 - 126 U/L - - 57  AST 15 - 41 U/L - - 19  ALT 14 - 54 U/L - - 30    Lipid Panel     Component Value Date/Time   CHOL 121 03/16/2021 0854   TRIG 181 (H) 03/16/2021 0854   HDL 37 (L) 03/16/2021 0854   CHOLHDL 3.3 03/16/2021 0854   VLDL 36 03/16/2021 0854   LDLCALC 48 03/16/2021 0854    No components found for: NTPROBNP No results for input(s): PROBNP in the last 8760 hours. Recent Labs    03/16/21 0451  TSH 2.345    BMP Recent Labs    03/15/21 2028 03/16/21 0451  NA 140 140  K 3.7 3.7  CL 108 106  CO2 24 24  GLUCOSE 122* 120*  BUN 9 8  CREATININE 0.85 0.91  CALCIUM 8.9 8.8*  GFRNONAA >60 >60    HEMOGLOBIN A1C Lab Results  Component Value Date   HGBA1C 7.3 (H) 03/16/2021   MPG 162.81 03/16/2021   External Labs: Collected: 02/27/2021 Creatinine 0.89 mg/dL. eGFR: 82 mL/min per 1.73 m Potassium 4.2. AST 20, ALT 26, alkaline phosphatase 72 Hemoglobin 13.5 g/dL, hematocrit 42% Troponin I 13 Hemoglobin A1c: 7.1  Lipid profile: Collected: 06/02/2019 Total cholesterol 145, triglycerides 328, HDL 34, LDL 45  IMPRESSION:    ICD-10-CM   1. Nonobstructive atherosclerosis of coronary artery  I25.10 aspirin EC 81 MG tablet    losartan (COZAAR) 25 MG tablet     metoprolol succinate (TOPROL XL) 25 MG 24 hr tablet    2. Type 2 diabetes mellitus with hyperglycemia, without long-term current use of insulin (HCC)  E11.65 losartan (COZAAR) 25 MG tablet    3. Essential hypertension  I10 EKG 12-Lead    losartan (COZAAR) 25 MG tablet    4. Mixed hyperlipidemia  E78.2     5. Hx-TIA (transient ischemic attack)  Z86.73     6. Class 2  severe obesity due to excess calories with serious comorbidity and body mass index (BMI) of 39.0 to 39.9 in adult [facility])  E66.01    Z68.39        RECOMMENDATIONS: Shannon Rosario is a 60 y.o. female whose past medical history and cardiac risk factors include: Nonobstructive CAD, hypertension, Hx of TIA, Non-insulin-dependent diabetes mellitus type 2, hyperlipidemia, obesity due to excess calories, family history of premature CAD, postmenopausal female.  Nonobstructive CAD: Chest pain-free. No use of nitroglycerin tablets. No hospitalizations or urgent care visits. Refilled Toprol-XL. Due to her feeling lethargic and stopping all blood pressure medications patient has requested to restart her medications but at a reduced doses. Discontinue losartan 50 mg/hydrochlorothiazide 12.5 mg p.o. daily Start losartan 25 mg p.o. daily Start aspirin 81 mg p.o. daily Sleep study negative for sleep apnea Educated on the importance of improving her modifiable cardiovascular risk factors.  Benign essential hypertension: Office blood pressure is within goal.   See above Reemphasized importance of low-salt diet.    Non-insulin-dependent diabetes mellitus type 2:  Most recent hemoglobin A1c reviewed.   Patient educated on the importance of glycemic control.   Currently on ARB, statin therapy, aspirin. Currently managed by primary care provider.  FINAL MEDICATION LIST END OF ENCOUNTER: Meds ordered this encounter  Medications   aspirin EC 81 MG tablet    Sig: Take 1 tablet (81 mg total) by mouth daily. Swallow whole.     Dispense:  30 tablet    Refill:  11   losartan (COZAAR) 25 MG tablet    Sig: Take 1 tablet (25 mg total) by mouth every evening.    Dispense:  90 tablet    Refill:  0   metoprolol succinate (TOPROL XL) 25 MG 24 hr tablet    Sig: Take 1 tablet (25 mg total) by mouth daily.    Dispense:  90 tablet    Refill:  0   Medications Discontinued During This Encounter  Medication Reason   meloxicam (MOBIC) 15 MG tablet Error   aspirin 325 MG tablet Dose change   losartan-hydrochlorothiazide (HYZAAR) 50-12.5 MG tablet Dose change   metoprolol succinate (TOPROL XL) 25 MG 24 hr tablet Reorder      Current Outpatient Medications:    amLODipine (NORVASC) 10 MG tablet, Take 1 tablet by mouth daily., Disp: , Rfl:    aspirin EC 81 MG tablet, Take 1 tablet (81 mg total) by mouth daily. Swallow whole., Disp: 30 tablet, Rfl: 11   cholecalciferol (VITAMIN D) 1000 UNITS tablet, Take 1,000 Units by mouth daily., Disp: , Rfl:    cycloSPORINE (RESTASIS) 0.05 % ophthalmic emulsion, 2 (two) times daily., Disp: , Rfl:    EPINEPHrine 0.3 mg/0.3 mL IJ SOAJ injection, Inject 0.3 mLs (0.3 mg total) into the muscle once as needed (anaphylaxis)., Disp: 1 Device, Rfl: 0   gabapentin (NEURONTIN) 300 MG capsule, Take 300 mg by mouth 2 (two) times daily as needed., Disp: , Rfl:    glimepiride (AMARYL) 4 MG tablet, Take 4 mg by mouth in the morning and at bedtime., Disp: , Rfl:    levocetirizine (XYZAL) 5 MG tablet, Take 5 mg by mouth daily., Disp: , Rfl:    losartan (COZAAR) 25 MG tablet, Take 1 tablet (25 mg total) by mouth every evening., Disp: 90 tablet, Rfl: 0   metFORMIN (GLUCOPHAGE) 500 MG tablet, Take 2 tablets by mouth 2 (two) times daily., Disp: , Rfl:    nitroGLYCERIN (NITROSTAT) 0.4 MG SL tablet, TAKE 1  TAB SUBLINGUALLY EVERY 5 MINUTES AS NEEDED, Disp: , Rfl:    omeprazole (PRILOSEC) 20 MG capsule, Take 20 mg by mouth daily., Disp: , Rfl:    rosuvastatin (CRESTOR) 10 MG tablet, Take 10 mg by mouth daily., Disp: ,  Rfl:    TRULICITY 1.5 YY/3.4JY SOPN, Inject 1.5 mg into the skin once a week., Disp: , Rfl:    vitamin E 1000 UNIT capsule, Take 1,000 Units by mouth daily., Disp: , Rfl:    metoprolol succinate (TOPROL XL) 25 MG 24 hr tablet, Take 1 tablet (25 mg total) by mouth daily., Disp: 90 tablet, Rfl: 0  Orders Placed This Encounter  Procedures   EKG 12-Lead    There are no Patient Instructions on file for this visit.   --Continue cardiac medications as reconciled in final medication list. --Return in about 6 months (around 04/05/2022) for Follow up, CAD. Or sooner if needed. --Continue follow-up with your primary care physician regarding the management of your other chronic comorbid conditions.  Patient's questions and concerns were addressed to her satisfaction. She voices understanding of the instructions provided during this encounter.   This note was created using a voice recognition software as a result there may be grammatical errors inadvertently enclosed that do not reflect the nature of this encounter. Every attempt is made to correct such errors.  Rex Kras, [place], [facility]  Pager: [phone] Office: [phone]

## 2021-10-10 ENCOUNTER — Other Ambulatory Visit: Payer: Self-pay

## 2021-10-10 ENCOUNTER — Ambulatory Visit: Payer: Medicare Other | Admitting: Physical Therapy

## 2021-10-10 ENCOUNTER — Encounter: Payer: Self-pay | Admitting: Physical Therapy

## 2021-10-10 DIAGNOSIS — R262 Difficulty in walking, not elsewhere classified: Secondary | ICD-10-CM

## 2021-10-10 DIAGNOSIS — R252 Cramp and spasm: Secondary | ICD-10-CM | POA: Diagnosis not present

## 2021-10-10 DIAGNOSIS — M542 Cervicalgia: Secondary | ICD-10-CM | POA: Diagnosis not present

## 2021-10-10 DIAGNOSIS — M6281 Muscle weakness (generalized): Secondary | ICD-10-CM | POA: Diagnosis not present

## 2021-10-10 DIAGNOSIS — M5416 Radiculopathy, lumbar region: Secondary | ICD-10-CM | POA: Diagnosis not present

## 2021-10-10 NOTE — Therapy (Signed)
[place]. [place], [place], [address] Phone: [phone]   Fax:  [phone]  Physical Therapy Treatment  Patient Details  Name: Shannon Rosario MRN: [id number] Date of Birth: 1961-07-19 Referring Provider (PT): Dr Edmonia Lynch   Encounter Date: 10/10/2021   PT End of Session - 10/10/21 1339     Visit Number 14    Date for PT Re-Evaluation 11/03/21    PT Start Time 1100    PT Stop Time 1344    PT Time Calculation (min) 164 min    Activity Tolerance Patient tolerated treatment well    Behavior During Therapy [facility] for tasks assessed/performed             Past Medical History:  Diagnosis Date   Anxiety    Arthritis    Depression    Diabetes mellitus without complication ([place])    Family history of adverse reaction to anesthesia    My Mother had some complications,I dont remember what    Fibromyalgia    GERD (gastroesophageal reflux disease)    Heart murmur    Hypercholesterolemia    Hypertension    Obesity    OSA (obstructive sleep apnea)    TIA (transient ischemic attack) 12/24/2010    Past Surgical History:  Procedure Laterality Date   [place] Left 1995   LEFT HEART CATH AND CORONARY ANGIOGRAPHY N/A 03/16/2021   Procedure: LEFT HEART CATH AND CORONARY ANGIOGRAPHY;  Surgeon: Nigel Mormon, MD;  Location: [facility];  Service: Cardiovascular;  Laterality: N/A;   [place]    There were no vitals filed for this visit.   Subjective Assessment - 10/10/21 1305     Subjective L hip area hurts, wakes her up when she is sleep    Currently in Pain? Yes    Pain Score 6     Pain Location Hip    Pain Orientation Left;Lower                               [facility] Adult PT Treatment/Exercise - 10/10/21 0001       Neck  Exercises: Machines for Strengthening   Cybex Row 35lb 2x10    Lat Pull 35# 2x10      Lumbar Exercises: Stretches   Passive Hamstring Stretch Left;5 reps;10 seconds;20 seconds    Single Knee to Chest Stretch Left;5 reps;10 seconds    ITB Stretch Left;5 reps;10 seconds;20 seconds    Piriformis Stretch Left;10 seconds;5 reps;20 seconds    Figure 4 Stretch 4 reps;10 seconds      Lumbar Exercises: Aerobic   Nustep L5 x 6 min      Lumbar Exercises: Standing   Other Standing Lumbar Exercises 30lb resisted side step x5 each    Other Standing Lumbar Exercises 6in lateral step up x 1- each      Lumbar Exercises: Supine   Bridge Compliant;10 reps;2 seconds                       PT Short Term Goals - 08/22/21 1328       PT SHORT TERM GOAL #1   Title Pt will be independent with initial HEP.    Status Achieved  PT Long Term Goals - 10/03/21 1139       PT LONG TERM GOAL #1   Title Pt will be independent with advanced HEP.    Status Partially Met      PT LONG TERM GOAL #2   Title Pt will verbalize/demonstrate understanding of proper posture and body mechanics to reduce strain on low back during daily tasks    Status Partially Met      PT LONG TERM GOAL #3   Title Pt will report >/= 25% improvement in cervicalgia and LBP for improved standing and walking tolerance    Status Partially Met                   Plan - 10/10/21 1339     Clinical Impression Statement Pt enters clinic reporting increase L hip pain. Pt able to complete exercise interventions. She reports a pull in her lateral thigh with resisted side steps. Slight increase in pain with lateral step ups. Spent a lot of time with passive stretching to the L hip with some reported  improvement in pain and mobility    Personal Factors and Comorbidities Fitness    Examination-Activity Limitations Locomotion Level;Sleep;Stairs    Examination-Participation Restrictions Community Activity     Stability/Clinical Decision Making Evolving/Moderate complexity    Rehab Potential Good    PT Frequency 2x / week    PT Duration 8 weeks    PT Next Visit Plan assess and progress, strengthening, core stability.  Modalities and manual therapy as indicated.             Patient will benefit from skilled therapeutic intervention in order to improve the following deficits and impairments:  Difficulty walking, Decreased endurance, Increased muscle spasms, Obesity, Decreased activity tolerance, Pain, Impaired flexibility, Improper body mechanics, Decreased strength, Postural dysfunction  Visit Diagnosis: Muscle weakness (generalized)  Cervicalgia  Difficulty in walking, not elsewhere classified  Cramp and spasm     Problem List Patient Active Problem List   Diagnosis Date Noted   Angina pectoris, unstable ([place]) 08/11/2021   Non-restorative sleep 08/11/2021   Unstable angina (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 [place], [place], [address] Phone: [phone]   Fax:  [phone]  Name: Shannon Rosario MRN: [id number] Date of Birth: 09-06-1961

## 2021-10-12 ENCOUNTER — Other Ambulatory Visit: Payer: Self-pay

## 2021-10-12 ENCOUNTER — Ambulatory Visit: Payer: Medicare Other | Admitting: Physical Therapy

## 2021-10-12 DIAGNOSIS — R252 Cramp and spasm: Secondary | ICD-10-CM | POA: Diagnosis not present

## 2021-10-12 DIAGNOSIS — M5416 Radiculopathy, lumbar region: Secondary | ICD-10-CM | POA: Diagnosis not present

## 2021-10-12 DIAGNOSIS — R262 Difficulty in walking, not elsewhere classified: Secondary | ICD-10-CM

## 2021-10-12 DIAGNOSIS — M542 Cervicalgia: Secondary | ICD-10-CM | POA: Diagnosis not present

## 2021-10-12 DIAGNOSIS — M6281 Muscle weakness (generalized): Secondary | ICD-10-CM | POA: Diagnosis not present

## 2021-10-12 NOTE — Therapy (Signed)
[facility]. [place], [place], [address] Phone: [phone]   Fax:  [phone]  Physical Therapy Treatment  Patient Details  Name: Cyra Spader MRN: [id number] Date of Birth: [birth year] Referring Provider (PT): Dr Edmonia Lynch   Encounter Date: 10/12/2021   PT End of Session - 10/12/21 1159     Visit Number 16    Date for PT Re-Evaluation 11/03/21    PT Start Time 2    PT Stop Time 1150    PT Time Calculation (min) 50 min             Past Medical History:  Diagnosis Date   Anxiety    Arthritis    Depression    Diabetes mellitus without complication ([place])    Family history of adverse reaction to anesthesia    My Mother had some complications,I dont remember what    Fibromyalgia    GERD (gastroesophageal reflux disease)    Heart murmur    Hypercholesterolemia    Hypertension    Obesity    OSA (obstructive sleep apnea)    TIA (transient ischemic attack) 12/24/2010    Past Surgical History:  Procedure Laterality Date   [place] Left 1995   LEFT HEART CATH AND CORONARY ANGIOGRAPHY N/A 03/16/2021   Procedure: LEFT HEART CATH AND CORONARY ANGIOGRAPHY;  Surgeon: Nigel Mormon, MD;  Location: [facility];  Service: Cardiovascular;  Laterality: N/A;   [place]    There were no vitals filed for this visit.   Subjective Assessment - 10/12/21 1141     Subjective left hip is really hurting me ,waking me at night and leg is cramping    Currently in Pain? Yes    Pain Score 8     Pain Location Hip    Pain Orientation Left                               [facility] Adult PT Treatment/Exercise - 10/12/21 0001       Modalities   Modalities Moist Heat;Iontophoresis;Electrical Stimulation      Moist Heat Therapy   Number  Minutes Moist Heat 15 Minutes    Moist Heat Location Hip      Electrical Stimulation   Electrical Stimulation Location left hip/ITB    Electrical Stimulation Action IFC    Electrical Stimulation Parameters SL    Electrical Stimulation Goals Tone;Pain      Iontophoresis   Type of Iontophoresis Dexamethasone    Location Left GT    Dose 1.2 cc    Time 80 mA leave on patch      Manual Therapy   Manual Therapy Soft tissue mobilization    Manual therapy comments RT SL- very tender over GT, TP in piriformis and ITB; skilled palpation and monitoring of soft tissues during DN    Soft tissue mobilization left buttock, hip and ITB              Trigger Point Dry Needling - 10/12/21 0001     Consent Given? Yes    Education Handout Provided Previously provided    Muscles Treated Lower Quadrant Vastus lateralis    Muscles Treated Back/Hip Gluteus minimus;Gluteus medius;Gluteus maximus;Piriformis;Tensor fascia lata  Dry Needling Comments left    Vastus lateralis Response Twitch response elicited;Palpable increased muscle length    Gluteus Minimus Response Palpable increased muscle length    Gluteus Medius Response Palpable increased muscle length    Gluteus Maximus Response Palpable increased muscle length    Piriformis Response Palpable increased muscle length    Tensor Fascia Lata Response Twitch response elicited;Palpable increased muscle length                     PT Short Term Goals - 08/22/21 1328       PT SHORT TERM GOAL #1   Title Pt will be independent with initial HEP.    Status Achieved               PT Long Term Goals - 10/12/21 1203       PT LONG TERM GOAL #1   Title Pt will be independent with advanced HEP.    Status Partially Met      PT LONG TERM GOAL #2   Title Pt will verbalize/demonstrate understanding of proper posture and body mechanics to reduce strain on low back during daily tasks    Status Partially Met      PT LONG TERM GOAL #3    Title Pt will report >/= 25% improvement in cervicalgia and LBP for improved standing and walking tolerance    Status Partially Met      PT LONG TERM GOAL #4   Title Pt will demostrate increased in L hip and R shoulder strength to 4-/5 to 4/5 for improved proximal stability    Status Partially Met      PT LONG TERM GOAL #5   Title Pt will report ability to walk for 15-20 minutes w/o reliance on UE/UB support on grocery cart w/o increased LBP to improve tolerance for grocery shopping    Status Partially Met                   Plan - 10/12/21 1201     Clinical Impression Statement no changes in goals. pt arrived with c/o increased left hip pain waking her at night and cramps. STW revealed tender over GT and tenderness and TP in buttock and ITB. STW was painful so switched to DN followed by estm/MH and ionto for ? GT bursitis as it is very tender and painful.    PT Treatment/Interventions ADLs/Self Care Home Management;Cryotherapy;Electrical Stimulation;Iontophoresis 10m/ml Dexamethasone;Moist Heat;Traction;Ultrasound;Gait training;Stair training;Functional mobility training;Therapeutic activities;Therapeutic exercise;Balance training;Neuromuscular re-education;Patient/family education;Manual techniques;Passive range of motion;Dry needling;Taping;Spinal Manipulations;Joint Manipulations    PT Next Visit Plan assess response to today tx and progress             Patient will benefit from skilled therapeutic intervention in order to improve the following deficits and impairments:  Difficulty walking, Decreased endurance, Increased muscle spasms, Obesity, Decreased activity tolerance, Pain, Impaired flexibility, Improper body mechanics, Decreased strength, Postural dysfunction  Visit Diagnosis: Difficulty in walking, not elsewhere classified  Cramp and spasm     Problem List Patient Active Problem List   Diagnosis Date Noted   Angina pectoris, unstable ([place] 08/11/2021    Non-restorative sleep 08/11/2021   Unstable angina ([place] 03/16/2021   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   Fire ant bite 10/22/2016   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/2016   Allergic urticaria 10/22/2016   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 10/22/2016   Obstructive sleep apnea treated with continuous positive airway pressure (CPAP) 10/22/2016  Chest pain 09/21/2015   Essential hypertension 09/21/2015   Diabetes ([place]) 09/21/2015   Pain in the chest     Eulalah Rupert,ANGIE, PTA 10/12/2021, 12:05 PM  [facility]. [place], [place], [address] Phone: [phone]   Fax:  [phone]  Name: Mckenley Birenbaum MRN: [id number] Date of Birth: Feb 02, 1961

## 2021-10-16 DIAGNOSIS — M4727 Other spondylosis with radiculopathy, lumbosacral region: Secondary | ICD-10-CM | POA: Diagnosis not present

## 2021-10-16 DIAGNOSIS — M4802 Spinal stenosis, cervical region: Secondary | ICD-10-CM | POA: Diagnosis not present

## 2021-10-16 DIAGNOSIS — M4726 Other spondylosis with radiculopathy, lumbar region: Secondary | ICD-10-CM | POA: Diagnosis not present

## 2021-10-16 DIAGNOSIS — M4722 Other spondylosis with radiculopathy, cervical region: Secondary | ICD-10-CM | POA: Diagnosis not present

## 2021-10-16 DIAGNOSIS — R937 Abnormal findings on diagnostic imaging of other parts of musculoskeletal system: Secondary | ICD-10-CM | POA: Diagnosis not present

## 2021-10-17 ENCOUNTER — Other Ambulatory Visit: Payer: Self-pay

## 2021-10-17 ENCOUNTER — Encounter: Payer: Self-pay | Admitting: Physical Therapy

## 2021-10-17 ENCOUNTER — Ambulatory Visit: Payer: Medicare Other | Admitting: Physical Therapy

## 2021-10-17 DIAGNOSIS — R252 Cramp and spasm: Secondary | ICD-10-CM

## 2021-10-17 DIAGNOSIS — M6281 Muscle weakness (generalized): Secondary | ICD-10-CM | POA: Diagnosis not present

## 2021-10-17 DIAGNOSIS — M5416 Radiculopathy, lumbar region: Secondary | ICD-10-CM | POA: Diagnosis not present

## 2021-10-17 DIAGNOSIS — R262 Difficulty in walking, not elsewhere classified: Secondary | ICD-10-CM

## 2021-10-17 DIAGNOSIS — M542 Cervicalgia: Secondary | ICD-10-CM | POA: Diagnosis not present

## 2021-10-17 NOTE — Therapy (Signed)
[facility]. [place], [place], [address] Phone: [phone]   Fax:  [phone]  Physical Therapy Treatment  Patient Details  Name: Shannon Rosario MRN: [id number] Date of Birth: 11-04-1961 Referring Provider (PT): Dr Edmonia Lynch   Encounter Date: 10/17/2021   PT End of Session - 10/17/21 1345     Visit Number 17    Date for PT Re-Evaluation 11/03/21    PT Start Time 1300    PT Stop Time 1345    PT Time Calculation (min) 45 min    Activity Tolerance Patient tolerated treatment well    Behavior During Therapy [facility] for tasks assessed/performed             Past Medical History:  Diagnosis Date   Anxiety    Arthritis    Depression    Diabetes mellitus without complication ([place])    Family history of adverse reaction to anesthesia    My Mother had some complications,I dont remember what    Fibromyalgia    GERD (gastroesophageal reflux disease)    Heart murmur    Hypercholesterolemia    Hypertension    Obesity    OSA (obstructive sleep apnea)    TIA (transient ischemic attack) 12/24/2010    Past Surgical History:  Procedure Laterality Date   [place] Left 1995   LEFT HEART CATH AND CORONARY ANGIOGRAPHY N/A 03/16/2021   Procedure: LEFT HEART CATH AND CORONARY ANGIOGRAPHY;  Surgeon: Nigel Mormon, MD;  Location: [facility];  Service: Cardiovascular;  Laterality: N/A;   [place]    There were no vitals filed for this visit.   Subjective Assessment - 10/17/21 1302     Subjective Left leg is sore. bio freeze does allow her to sleep    Currently in Pain? Yes    Pain Score 6     Pain Location Hip    Pain Orientation Left                               [facility] Adult PT Treatment/Exercise - 10/17/21 0001       Lumbar  Exercises: Stretches   Passive Hamstring Stretch Left;5 reps;10 seconds;20 seconds    Single Knee to Chest Stretch Left;5 reps;10 seconds    ITB Stretch Left;5 reps;10 seconds;20 seconds    Piriformis Stretch Left;10 seconds;5 reps;20 seconds      Lumbar Exercises: Aerobic   Nustep L5 x 6 min      Lumbar Exercises: Standing   Other Standing Lumbar Exercises 30lb resisted side step x5 each      Lumbar Exercises: Seated   Sit to Stand 20 reps   OHP blue ball     Lumbar Exercises: Supine   Bridge Compliant;20 reps;2 seconds      Iontophoresis   Type of Iontophoresis Dexamethasone    Location Left GT    Dose 1.1cc    Time 80 mA leave on patch                       PT Short Term Goals - 08/22/21 1328       PT SHORT TERM GOAL #1   Title Pt will be independent with initial HEP.  Status Achieved               PT Long Term Goals - 10/12/21 1203       PT LONG TERM GOAL #1   Title Pt will be independent with advanced HEP.    Status Partially Met      PT LONG TERM GOAL #2   Title Pt will verbalize/demonstrate understanding of proper posture and body mechanics to reduce strain on low back during daily tasks    Status Partially Met      PT LONG TERM GOAL #3   Title Pt will report >/= 25% improvement in cervicalgia and LBP for improved standing and walking tolerance    Status Partially Met      PT LONG TERM GOAL #4   Title Pt will demostrate increased in L hip and R shoulder strength to 4-/5 to 4/5 for improved proximal stability    Status Partially Met      PT LONG TERM GOAL #5   Title Pt will report ability to walk for 15-20 minutes w/o reliance on UE/UB support on grocery cart w/o increased LBP to improve tolerance for grocery shopping    Status Partially Met                   Plan - 10/17/21 1346     Clinical Impression Statement Pt again enters clinic reporting increase in L hip pain but wanted to try some exercises. Cue for eccentric  control needed with sit to stands. Resisted sie step did cause an increase in L hip pain. Pt resolved some with passive stretching. [place] for pain.    Personal Factors and Comorbidities Fitness    Examination-Activity Limitations Locomotion Level;Sleep;Stairs    Examination-Participation Restrictions Community Activity    Stability/Clinical Decision Making Evolving/Moderate complexity    Rehab Potential Good    PT Frequency 2x / week    PT Duration 8 weeks    PT Treatment/Interventions ADLs/Self Care Home Management;Cryotherapy;Electrical Stimulation;Iontophoresis 56m/ml Dexamethasone;Moist Heat;Traction;Ultrasound;Gait training;Stair training;Functional mobility training;Therapeutic activities;Therapeutic exercise;Balance training;Neuromuscular re-education;Patient/family education;Manual techniques;Passive range of motion;Dry needling;Taping;Spinal Manipulations;Joint Manipulations    PT Next Visit Plan assess response to today tx and progress             Patient will benefit from skilled therapeutic intervention in order to improve the following deficits and impairments:  Difficulty walking, Decreased endurance, Increased muscle spasms, Obesity, Decreased activity tolerance, Pain, Impaired flexibility, Improper body mechanics, Decreased strength, Postural dysfunction  Visit Diagnosis: Difficulty in walking, not elsewhere classified  Cramp and spasm  Muscle weakness (generalized)     Problem List Patient Active Problem List   Diagnosis Date Noted   Angina pectoris, unstable ([place] 08/11/2021   Non-restorative sleep 08/11/2021   Unstable angina ([place]idential [place] 03/16/2021   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   Fire ant bite 10/22/2016   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/2016   Allergic urticaria 10/22/2016   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 10/22/2016   Obstructive sleep apnea treated with continuous positive airway pressure (CPAP)  10/22/2016   Chest pain 09/21/2015   Essential hypertension 09/21/2015   Diabetes ([place] 09/21/2015   Pain in the chest     RScot Jun PTA 10/17/2021, 1:49 PM  [place] [address]Phone: [phone]  Fax:  [phone] Name: MKaytlan BehrmanMRN: [id number] of Birth: [birth year]

## 2021-10-19 ENCOUNTER — Ambulatory Visit: Payer: Medicare Other | Admitting: Physical Therapy

## 2021-10-19 ENCOUNTER — Other Ambulatory Visit: Payer: Self-pay

## 2021-10-19 ENCOUNTER — Encounter: Payer: Self-pay | Admitting: Physical Therapy

## 2021-10-19 DIAGNOSIS — R262 Difficulty in walking, not elsewhere classified: Secondary | ICD-10-CM | POA: Diagnosis not present

## 2021-10-19 DIAGNOSIS — R252 Cramp and spasm: Secondary | ICD-10-CM | POA: Diagnosis not present

## 2021-10-19 DIAGNOSIS — M542 Cervicalgia: Secondary | ICD-10-CM

## 2021-10-19 DIAGNOSIS — M7062 Trochanteric bursitis, left hip: Secondary | ICD-10-CM | POA: Diagnosis not present

## 2021-10-19 DIAGNOSIS — M5416 Radiculopathy, lumbar region: Secondary | ICD-10-CM | POA: Diagnosis not present

## 2021-10-19 DIAGNOSIS — M545 Low back pain, unspecified: Secondary | ICD-10-CM | POA: Diagnosis not present

## 2021-10-19 DIAGNOSIS — M6281 Muscle weakness (generalized): Secondary | ICD-10-CM | POA: Diagnosis not present

## 2021-10-19 NOTE — Therapy (Signed)
[facility]. [place], [place], [address] Phone: [phone]   Fax:  [phone]  Physical Therapy Treatment  Patient Details  Name: Shannon Rosario MRN: [id number] Date of Birth: 1961-05-21 Referring Provider (PT): Dr Edmonia Lynch   Encounter Date: 10/19/2021   PT End of Session - 10/19/21 1214     Visit Number 18    Date for PT Re-Evaluation 11/03/21    PT Start Time 1146    PT Stop Time 1228    PT Time Calculation (min) 42 min    Activity Tolerance Patient tolerated treatment well    Behavior During Therapy [facility] for tasks assessed/performed             Past Medical History:  Diagnosis Date   Anxiety    Arthritis    Depression    Diabetes mellitus without complication ([place])    Family history of adverse reaction to anesthesia    My Mother had some complications,I dont remember what    Fibromyalgia    GERD (gastroesophageal reflux disease)    Heart murmur    Hypercholesterolemia    Hypertension    Obesity    OSA (obstructive sleep apnea)    TIA (transient ischemic attack) 12/24/2010    Past Surgical History:  Procedure Laterality Date   [place] Left 1995   LEFT HEART CATH AND CORONARY ANGIOGRAPHY N/A 03/16/2021   Procedure: LEFT HEART CATH AND CORONARY ANGIOGRAPHY;  Surgeon: Nigel Mormon, MD;  Location: [facility];  Service: Cardiovascular;  Laterality: N/A;   [place]    There were no vitals filed for this visit.   Subjective Assessment - 10/19/21 1152     Subjective Just tight and sore. Pain in the L hip, R shoulder and next    Currently in Pain? Yes    Pain Score 5     Pain Location Leg    Pain Orientation Left                               [facility] Adult PT Treatment/Exercise - 10/19/21 0001        Neck Exercises: Machines for Strengthening   Cybex Row 35lb 2x12    Lat Pull 35# 2x12    Other Machines for Strengthening Shoulder Ext causes R lateral arm pain      Lumbar Exercises: Aerobic   Nustep L5 x 6 min      Lumbar Exercises: Machines for Strengthening   Cybex Knee Extension 10lb 2x10    Cybex Knee Flexion 25lb 2x10      Manual Therapy   Manual Therapy Soft tissue mobilization    Manual therapy comments RT SL- very tender over GT, TP in piriformis and ITB; skilled palpation and monitoring of soft tissues during DN    Soft tissue mobilization left buttock, hip and ITB              Trigger Point Dry Needling - 10/19/21 0001     Consent Given? Yes    Vastus lateralis Response Twitch response elicited;Palpable increased muscle length    Gluteus Minimus Response Palpable increased muscle length    Gluteus Medius Response Palpable increased muscle length    Gluteus Maximus Response Palpable  increased muscle length    Piriformis Response Palpable increased muscle length    Tensor Fascia Lata Response Twitch response elicited;Palpable increased muscle length                     PT Short Term Goals - 08/22/21 1328       PT SHORT TERM GOAL #1   Title Pt will be independent with initial HEP.    Status Achieved               PT Long Term Goals - 10/19/21 1218       PT LONG TERM GOAL #1   Title Pt will be independent with advanced HEP.    Status Partially Met      PT LONG TERM GOAL #2   Title Pt will verbalize/demonstrate understanding of proper posture and body mechanics to reduce strain on low back during daily tasks    Status Partially Met      PT LONG TERM GOAL #3   Title Pt will report >/= 25% improvement in cervicalgia and LBP for improved standing and walking tolerance    Status Partially Met                   Plan - 10/19/21 1218     Clinical Impression Statement Pain and tightness remains, radiating pain down the LLE  reported.  Focuses more on postural strengthening during today's session. Cue for core engagement needed with seated rows. Some muscle fatigue and burning reported what seated le curls and extensions. Lead PT assisted in treatment providing DN.    Personal Factors and Comorbidities Fitness    Examination-Activity Limitations Locomotion Level;Sleep;Stairs    Examination-Participation Restrictions Community Activity    Stability/Clinical Decision Making Evolving/Moderate complexity    Rehab Potential Good    PT Frequency 2x / week    PT Duration 8 weeks    PT Treatment/Interventions ADLs/Self Care Home Management;Cryotherapy;Electrical Stimulation;Iontophoresis 10m/ml Dexamethasone;Moist Heat;Traction;Ultrasound;Gait training;Stair training;Functional mobility training;Therapeutic activities;Therapeutic exercise;Balance training;Neuromuscular re-education;Patient/family education;Manual techniques;Passive range of motion;Dry needling;Taping;Spinal Manipulations;Joint Manipulations    PT Next Visit Plan assess response to today tx and progress             Patient will benefit from skilled therapeutic intervention in order to improve the following deficits and impairments:  Difficulty walking, Decreased endurance, Increased muscle spasms, Obesity, Decreased activity tolerance, Pain, Impaired flexibility, Improper body mechanics, Decreased strength, Postural dysfunction  Visit Diagnosis: Difficulty in walking, not elsewhere classified  Cervicalgia  Lumbar radiculopathy  Muscle weakness (generalized)  Cramp and spasm     Problem List Patient Active Problem List   Diagnosis Date Noted   Angina pectoris, unstable ([place] 08/11/2021   Non-restorative sleep 08/11/2021   Unstable angina ([place] 03/16/2021   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   Fire ant bite 10/22/2016   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/2016   Allergic urticaria 10/22/2016   Chronic seasonal  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 10/22/2016   Obstructive sleep apnea treated with continuous positive airway pressure (CPAP) 10/22/2016   Chest pain 09/21/2015   Essential hypertension 09/21/2015   Diabetes ([place] 09/21/2015   Pain in the chest     ASumner Boast PT 10/19/2021, 1:00 PM  [place] [address]Phone: [phone]  Fax:  [phone] Name: Shannon FlickerMRN: [id number] of Birth: [birth year]

## 2021-10-24 ENCOUNTER — Encounter: Payer: Self-pay | Admitting: Physical Therapy

## 2021-10-24 ENCOUNTER — Other Ambulatory Visit: Payer: Self-pay

## 2021-10-24 ENCOUNTER — Ambulatory Visit: Payer: Medicare Other | Attending: Orthopedic Surgery | Admitting: Physical Therapy

## 2021-10-24 DIAGNOSIS — R262 Difficulty in walking, not elsewhere classified: Secondary | ICD-10-CM | POA: Insufficient documentation

## 2021-10-24 DIAGNOSIS — M542 Cervicalgia: Secondary | ICD-10-CM | POA: Insufficient documentation

## 2021-10-24 DIAGNOSIS — M5416 Radiculopathy, lumbar region: Secondary | ICD-10-CM | POA: Diagnosis not present

## 2021-10-24 DIAGNOSIS — R252 Cramp and spasm: Secondary | ICD-10-CM | POA: Insufficient documentation

## 2021-10-24 DIAGNOSIS — M6281 Muscle weakness (generalized): Secondary | ICD-10-CM | POA: Insufficient documentation

## 2021-10-24 NOTE — Therapy (Signed)
[facility]. [place], [place], [address] Phone: [phone]   Fax:  [phone]  Physical Therapy Treatment  Patient Details  Name: Shannon Rosario MRN: [id number] Date of Birth: 1961/10/25 Referring Provider (PT): Dr Edmonia Lynch   Encounter Date: 10/24/2021   PT End of Session - 10/24/21 1224     Visit Number 19    Date for PT Re-Evaluation 11/03/21    PT Start Time 1149    PT Stop Time 1229    PT Time Calculation (min) 40 min    Activity Tolerance Patient tolerated treatment well             Past Medical History:  Diagnosis Date   Anxiety    Arthritis    Depression    Diabetes mellitus without complication ([place])    Family history of adverse reaction to anesthesia    My Mother had some complications,I dont remember what    Fibromyalgia    GERD (gastroesophageal reflux disease)    Heart murmur    Hypercholesterolemia    Hypertension    Obesity    OSA (obstructive sleep apnea)    TIA (transient ischemic attack) 12/24/2010    Past Surgical History:  Procedure Laterality Date   [place] Left 1995   LEFT HEART CATH AND CORONARY ANGIOGRAPHY N/A 03/16/2021   Procedure: LEFT HEART CATH AND CORONARY ANGIOGRAPHY;  Surgeon: Nigel Mormon, MD;  Location: [facility];  Service: Cardiovascular;  Laterality: N/A;   [place]    There were no vitals filed for this visit.   Subjective Assessment - 10/24/21 1149     Subjective Went to MD last week, She reports that the MD stated she has bursitis    Currently in Pain? No/denies                               [facility] Adult PT Treatment/Exercise - 10/24/21 0001       Neck Exercises: Machines for Strengthening   Cybex Row 35lb 2x12    Lat Pull 35# 2x12    Other Machines for  Strengthening Shoulder Ext 10lb 2x10      Lumbar Exercises: Aerobic   Nustep L5 x 6 min      Lumbar Exercises: Machines for Strengthening   Cybex Knee Extension 10lb 2x10    Cybex Knee Flexion 25lb 2x10      Lumbar Exercises: Standing   Other Standing Lumbar Exercises 8 in forward step ups    Other Standing Lumbar Exercises 6in lateral step up x 10 each                       PT Short Term Goals - 08/22/21 1328       PT SHORT TERM GOAL #1   Title Pt will be independent with initial HEP.    Status Achieved               PT Long Term Goals - 10/24/21 1225       PT LONG TERM GOAL #1   Title Pt will be independent with advanced HEP.    Status Achieved      PT LONG TERM GOAL #2   Title  Pt will verbalize/demonstrate understanding of proper posture and body mechanics to reduce strain on low back during daily tasks    Status Partially Met      PT LONG TERM GOAL #3   Title Pt will report >/= 25% improvement in cervicalgia and LBP for improved standing and walking tolerance    Status Partially Met                   Plan - 10/24/21 1226     Clinical Impression Statement Pt did well tolerating exercise interventions. She stated that she has bursitis in the L hip pr MD and will be receiving an injection tomorrow. Increase fatigue and shortness of breath with both lateral and forward step ups. Cue to prevent posterior trunk leaning with seated rows. Cue for core engagement needed with standing rows.    Personal Factors and Comorbidities Fitness    Examination-Activity Limitations Locomotion Level;Sleep;Stairs    Examination-Participation Restrictions Community Activity    Stability/Clinical Decision Making Evolving/Moderate complexity    Rehab Potential Good    PT Frequency 2x / week    PT Treatment/Interventions ADLs/Self Care Home Management;Cryotherapy;Electrical Stimulation;Iontophoresis 39m/ml Dexamethasone;Moist Heat;Traction;Ultrasound;Gait  training;Stair training;Functional mobility training;Therapeutic activities;Therapeutic exercise;Balance training;Neuromuscular re-education;Patient/family education;Manual techniques;Passive range of motion;Dry needling;Taping;Spinal Manipulations;Joint Manipulations    PT Next Visit Plan assess response to today tx and progress             Patient will benefit from skilled therapeutic intervention in order to improve the following deficits and impairments:  Difficulty walking, Decreased endurance, Increased muscle spasms, Obesity, Decreased activity tolerance, Pain, Impaired flexibility, Improper body mechanics, Decreased strength, Postural dysfunction  Visit Diagnosis: Difficulty in walking, not elsewhere classified  Lumbar radiculopathy  Cramp and spasm  Muscle weakness (generalized)  Cervicalgia     Problem List Patient Active Problem List   Diagnosis Date Noted   Angina pectoris, unstable ([place] 08/11/2021   Non-restorative sleep 08/11/2021   Unstable angina ([place] 03/16/2021   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   Fire ant bite 10/22/2016   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/2016   Allergic urticaria 10/22/2016   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 10/22/2016   Obstructive sleep apnea treated with continuous positive airway pressure (CPAP) 10/22/2016   Chest pain 09/21/2015   Essential hypertension 09/21/2015   Diabetes ([place] 09/21/2015   Pain in the chest     RScot Jun PTA 10/24/2021, 12:29 PM  [place] [address]Phone: [phone]  Fax:  [phone] Name: Shannon FulpMRN: [id number] of Birth: 1October 30, 1962

## 2021-10-25 DIAGNOSIS — M7062 Trochanteric bursitis, left hip: Secondary | ICD-10-CM | POA: Diagnosis not present

## 2021-10-26 ENCOUNTER — Ambulatory Visit: Payer: Medicare Other | Admitting: Physical Therapy

## 2021-10-31 ENCOUNTER — Other Ambulatory Visit: Payer: Self-pay

## 2021-10-31 ENCOUNTER — Encounter: Payer: Self-pay | Admitting: Physical Therapy

## 2021-10-31 ENCOUNTER — Ambulatory Visit: Payer: Medicare Other | Admitting: Physical Therapy

## 2021-10-31 DIAGNOSIS — R252 Cramp and spasm: Secondary | ICD-10-CM | POA: Diagnosis not present

## 2021-10-31 DIAGNOSIS — M542 Cervicalgia: Secondary | ICD-10-CM

## 2021-10-31 DIAGNOSIS — M6281 Muscle weakness (generalized): Secondary | ICD-10-CM | POA: Diagnosis not present

## 2021-10-31 DIAGNOSIS — R262 Difficulty in walking, not elsewhere classified: Secondary | ICD-10-CM

## 2021-10-31 DIAGNOSIS — M5416 Radiculopathy, lumbar region: Secondary | ICD-10-CM | POA: Diagnosis not present

## 2021-10-31 NOTE — Therapy (Signed)
[place]. [place], [place], [address] Phone: [phone]   Fax:  [phone]  Physical Therapy Treatment  Patient Details  Name: Shannon Rosario MRN: [id number] Date of Birth: July 29, 1961 Referring Provider (PT): Dr Edmonia Lynch   Encounter Date: 10/31/2021    Past Medical History:  Diagnosis Date   Anxiety    Arthritis    Depression    Diabetes mellitus without complication ([place])    Family history of adverse reaction to anesthesia    My Mother had some complications,I dont remember what    Fibromyalgia    GERD (gastroesophageal reflux disease)    Heart murmur    Hypercholesterolemia    Hypertension    Obesity    OSA (obstructive sleep apnea)    TIA (transient ischemic attack) 12/24/2010    Past Surgical History:  Procedure Laterality Date   [place] Left 1995   LEFT HEART CATH AND CORONARY ANGIOGRAPHY N/A 03/16/2021   Procedure: LEFT HEART CATH AND CORONARY ANGIOGRAPHY;  Surgeon: Nigel Mormon, MD;  Location: [facility];  Service: Cardiovascular;  Laterality: N/A;   [place]    There were no vitals filed for this visit.   Subjective Assessment - 10/31/21 0851     Subjective She reports that her back feels very tight. She received her hip injection and it has been fluctuating. It burns today. The Dr told her it could take up to a week to start acting. MRI showed progessing arthritis throughout spine, worst in neck and low back.    Pertinent History DDD    How long can you sit comfortably? 60 minutes with shifting around.    How long can you stand comfortably? 15 min    How long can you walk comfortably? Slowly for about 60.    Currently in Pain? No/denies                               [facility] Adult PT  Treatment/Exercise - 10/31/21 0001       Lumbar Exercises: Stretches   Active Hamstring Stretch Right;Left;3 reps;10 seconds    Single Knee to Chest Stretch Right;Left;3 reps;10 seconds    ITB Stretch Right;Left;1 rep;30 seconds      Lumbar Exercises: Aerobic   Tread Mill 1.2 x 6 minutes.      Lumbar Exercises: Supine   Bridge Compliant;10 reps;3 seconds    Bridge Limitations Over a ball. Then 10 reps with legs wide and turned in to engage add. Then rolled ball side to side x 5 while holding the bridge iwth IR.    Other Supine Lumbar Exercises Bridge on ball with roll ups x 10.      Lumbar Exercises: Quadruped   Other Quadruped Lumbar Exercises Attempted quadruped exercises, but it hurt paitent's knees too much.      Manual Therapy   Manual Therapy Soft tissue mobilization;Passive ROM    Manual therapy comments manual traction with soft tissue mobilization and stretch                       PT Short Term Goals - 08/22/21 1328       PT SHORT TERM GOAL #1   Title  Pt will be independent with initial HEP.    Status Achieved               PT Long Term Goals - 10/31/21 0902       PT LONG TERM GOAL #1   Title Pt will be independent with advanced HEP.    Status Achieved      PT LONG TERM GOAL #2   Title Pt will verbalize/demonstrate understanding of proper posture and body mechanics to reduce strain on low back during daily tasks    Status Partially Met      PT LONG TERM GOAL #3   Title Pt will report >/= 25% improvement in cervicalgia and LBP for improved standing and walking tolerance    Status Partially Met      PT LONG TERM GOAL #5   Title Pt will report ability to walk for 15-20 minutes w/o reliance on UE/UB support on grocery cart w/o increased LBP to improve tolerance for grocery shopping    Status Achieved      Additional Long Term Goals   Additional Long Term Goals Yes      PT LONG TERM GOAL #6   Title I with final HEP    Baseline Initiated     Time 4    Period Weeks    Status New    Target Date 11/28/21                    Patient will benefit from skilled therapeutic intervention in order to improve the following deficits and impairments:     Visit Diagnosis: Difficulty in walking, not elsewhere classified  Lumbar radiculopathy  Cramp and spasm  Muscle weakness (generalized)  Cervicalgia     Problem List Patient Active Problem List   Diagnosis Date Noted   Angina pectoris, unstable ([place]) 08/11/2021   Non-restorative sleep 08/11/2021   Unstable angina ([place]) 03/16/2021   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   Fire ant bite 10/22/2016   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/2016   Allergic urticaria 10/22/2016   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 10/22/2016   Obstructive sleep apnea treated with continuous positive airway pressure (CPAP) 10/22/2016   Chest pain 09/21/2015   Essential hypertension 09/21/2015   Diabetes ([place]) 09/21/2015   Pain in the chest     Marcelina Morel, DPT 10/31/2021, 2:07 PM  [place]. [place], [place], [address] Phone: [phone]   Fax:  [phone]  Name: Shannon Rosario MRN: [id number] Date of Birth: Aug 16, 1961

## 2021-11-02 ENCOUNTER — Other Ambulatory Visit: Payer: Self-pay

## 2021-11-02 ENCOUNTER — Ambulatory Visit: Payer: Medicare Other | Admitting: Physical Therapy

## 2021-11-02 ENCOUNTER — Encounter: Payer: Self-pay | Admitting: Physical Therapy

## 2021-11-02 DIAGNOSIS — R252 Cramp and spasm: Secondary | ICD-10-CM | POA: Diagnosis not present

## 2021-11-02 DIAGNOSIS — M6281 Muscle weakness (generalized): Secondary | ICD-10-CM

## 2021-11-02 DIAGNOSIS — M542 Cervicalgia: Secondary | ICD-10-CM

## 2021-11-02 DIAGNOSIS — M5416 Radiculopathy, lumbar region: Secondary | ICD-10-CM

## 2021-11-02 DIAGNOSIS — R262 Difficulty in walking, not elsewhere classified: Secondary | ICD-10-CM

## 2021-11-02 NOTE — Therapy (Signed)
[place]. [place], [place], [address] Phone: [phone]   Fax:  [phone]  Physical Therapy Treatment  Patient Details  Name: Shannon Rosario MRN: [id number] Date of Birth: October 09, 1961 Referring Provider (PT): Dr Edmonia Lynch   Encounter Date: 11/02/2021   PT End of Session - 11/02/21 1638     Visit Number 21    Date for PT Re-Evaluation 11/03/21    PT Start Time 1558    PT Stop Time 1640    PT Time Calculation (min) 42 min    Activity Tolerance Patient tolerated treatment well    Behavior During Therapy [facility] for tasks assessed/performed             Past Medical History:  Diagnosis Date   Anxiety    Arthritis    Depression    Diabetes mellitus without complication ([place])    Family history of adverse reaction to anesthesia    My Mother had some complications,I dont remember what    Fibromyalgia    GERD (gastroesophageal reflux disease)    Heart murmur    Hypercholesterolemia    Hypertension    Obesity    OSA (obstructive sleep apnea)    TIA (transient ischemic attack) 12/24/2010    Past Surgical History:  Procedure Laterality Date   [place] Left 1995   LEFT HEART CATH AND CORONARY ANGIOGRAPHY N/A 03/16/2021   Procedure: LEFT HEART CATH AND CORONARY ANGIOGRAPHY;  Surgeon: Nigel Mormon, MD;  Location: [facility];  Service: Cardiovascular;  Laterality: N/A;   [place]    There were no vitals filed for this visit.   Subjective Assessment - 11/02/21 1557     Subjective "I feel like my back and my legs are coming apart from each other" "Neck stiff"    Currently in Pain? No/denies                               [facility] Adult PT Treatment/Exercise - 11/02/21 0001       Neck Exercises: Seated   Neck Retraction  15 reps;3 secs    Shoulder ABduction Both;20 reps      Lumbar Exercises: Aerobic   Tread Mill 1.2 x 4 minutes.    Nustep L5 x 4 min      Lumbar Exercises: Machines for Strengthening   Cybex Knee Extension 10lb 2x10    Cybex Knee Flexion 20lb 2x10    Leg Press 40lb 2x10      Lumbar Exercises: Supine   Bridge Compliant;10 reps;3 seconds    Other Supine Lumbar Exercises LE on pball bridges, K2C, Oblq x10                       PT Short Term Goals - 08/22/21 1328       PT SHORT TERM GOAL #1   Title Pt will be independent with initial HEP.    Status Achieved               PT Long Term Goals - 10/31/21 0902       PT LONG TERM GOAL #1   Title Pt will be independent with advanced HEP.    Time 2  Period Weeks    Status Achieved    Target Date 11/28/21      PT LONG TERM GOAL #2   Title Pt will verbalize/demonstrate understanding of proper posture and body mechanics to reduce strain on low back during daily tasks    Baseline Educated patient to rationale for core strenthening HEP.    Time 2    Period Weeks    Status Partially Met    Target Date 11/14/21      PT LONG TERM GOAL #3   Title Pt will report >/= 25% improvement in cervicalgia and LBP for improved standing and walking tolerance    Status Achieved      PT LONG TERM GOAL #4   Title Pt will demostrate increased in L hip and R shoulder strength to 4-/5 to 4/5 for improved proximal stability    Baseline (3+)-4-/5    Time 2    Period Weeks    Status Partially Met    Target Date 11/14/21      PT LONG TERM GOAL #5   Title Pt will report ability to walk for 15-20 minutes w/o reliance on UE/UB support on grocery cart w/o increased LBP to improve tolerance for grocery shopping    Status Achieved      Additional Long Term Goals   Additional Long Term Goals Yes      PT LONG TERM GOAL #6   Title I with final HEP    Baseline Initiated    Time 2    Period Weeks    Status New    Target Date  11/14/21                   Plan - 11/02/21 1641     Clinical Impression Statement Pain in the L hip still fluctuates after L injection. Reports no pain today but states that her hip heels weird. She reports that she has ways of doing things without over taxing her back. Some core weakness noted with supine bridges. Some LE fatigue post treatment session. Pt stated that she will continue her care on her own.    Personal Factors and Comorbidities Fitness    Examination-Activity Limitations Locomotion Level;Sleep;Stairs    Examination-Participation Restrictions Community Activity    Stability/Clinical Decision Making Evolving/Moderate complexity    Rehab Potential Good    PT Frequency 2x / week    PT Duration Other (comment)    PT Treatment/Interventions ADLs/Self Care Home Management;Cryotherapy;Electrical Stimulation;Iontophoresis 41m/ml Dexamethasone;Moist Heat;Traction;Ultrasound;Gait training;Stair training;Functional mobility training;Therapeutic activities;Therapeutic exercise;Balance training;Neuromuscular re-education;Patient/family education;Manual techniques;Passive range of motion;Dry needling;Taping;Spinal Manipulations;Joint Manipulations    PT Next Visit Plan D/C PT             Patient will benefit from skilled therapeutic intervention in order to improve the following deficits and impairments:  Difficulty walking, Decreased endurance, Increased muscle spasms, Obesity, Decreased activity tolerance, Pain, Impaired flexibility, Improper body mechanics, Decreased strength, Postural dysfunction  Visit Diagnosis: Difficulty in walking, not elsewhere classified  Lumbar radiculopathy  Muscle weakness (generalized)  Cramp and spasm  Cervicalgia     Problem List Patient Active Problem List   Diagnosis Date Noted   Angina pectoris, unstable ([place] 08/11/2021   Non-restorative sleep 08/11/2021   Unstable angina ([place] 03/16/2021   Anaphylactic shock due to adverse  food reaction 10/22/2016   Fire ant bite 10/22/2016   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/2016   Allergic urticaria 10/22/2016   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 10/22/2016   Obstructive  sleep apnea treated with continuous positive airway pressure (CPAP) 10/22/2016   Chest pain 09/21/2015   Essential hypertension 09/21/2015   Diabetes ([place]) 09/21/2015   Pain in the chest    PHYSICAL THERAPY DISCHARGE SUMMARY  Visits from Start of Care: 21   Patient agrees to discharge. Patient goals were partially met. Patient is being discharged due to being pleased with the current functional level.   Scot Jun, PTA 11/02/2021, 4:46 PM  [place]. [place], [place], [address] Phone: [phone]   Fax:  [phone]  Name: Shannon Rosario MRN: [id number] Date of Birth: 12/22/1961

## 2021-11-10 DIAGNOSIS — E1165 Type 2 diabetes mellitus with hyperglycemia: Secondary | ICD-10-CM | POA: Diagnosis not present

## 2021-11-10 DIAGNOSIS — E1142 Type 2 diabetes mellitus with diabetic polyneuropathy: Secondary | ICD-10-CM | POA: Diagnosis not present

## 2021-11-10 DIAGNOSIS — I10 Essential (primary) hypertension: Secondary | ICD-10-CM | POA: Diagnosis not present

## 2021-11-10 DIAGNOSIS — E782 Mixed hyperlipidemia: Secondary | ICD-10-CM | POA: Diagnosis not present

## 2021-11-21 DIAGNOSIS — M7062 Trochanteric bursitis, left hip: Secondary | ICD-10-CM | POA: Diagnosis not present

## 2021-11-21 DIAGNOSIS — M545 Low back pain, unspecified: Secondary | ICD-10-CM | POA: Diagnosis not present

## 2021-12-08 DIAGNOSIS — I10 Essential (primary) hypertension: Secondary | ICD-10-CM | POA: Diagnosis not present

## 2021-12-08 DIAGNOSIS — E782 Mixed hyperlipidemia: Secondary | ICD-10-CM | POA: Diagnosis not present

## 2021-12-08 DIAGNOSIS — E1165 Type 2 diabetes mellitus with hyperglycemia: Secondary | ICD-10-CM | POA: Diagnosis not present

## 2021-12-08 DIAGNOSIS — E1142 Type 2 diabetes mellitus with diabetic polyneuropathy: Secondary | ICD-10-CM | POA: Diagnosis not present

## 2021-12-18 DIAGNOSIS — R1011 Right upper quadrant pain: Secondary | ICD-10-CM | POA: Diagnosis not present

## 2021-12-18 DIAGNOSIS — R531 Weakness: Secondary | ICD-10-CM | POA: Diagnosis not present

## 2021-12-18 DIAGNOSIS — R42 Dizziness and giddiness: Secondary | ICD-10-CM | POA: Diagnosis not present

## 2021-12-18 DIAGNOSIS — K5900 Constipation, unspecified: Secondary | ICD-10-CM | POA: Diagnosis not present

## 2021-12-29 ENCOUNTER — Other Ambulatory Visit: Payer: Self-pay | Admitting: Cardiology

## 2021-12-29 DIAGNOSIS — I2510 Atherosclerotic heart disease of native coronary artery without angina pectoris: Secondary | ICD-10-CM

## 2021-12-29 DIAGNOSIS — E1165 Type 2 diabetes mellitus with hyperglycemia: Secondary | ICD-10-CM

## 2021-12-29 DIAGNOSIS — I10 Essential (primary) hypertension: Secondary | ICD-10-CM

## 2022-01-02 DIAGNOSIS — K5901 Slow transit constipation: Secondary | ICD-10-CM | POA: Diagnosis not present

## 2022-01-22 DIAGNOSIS — L600 Ingrowing nail: Secondary | ICD-10-CM | POA: Diagnosis not present

## 2022-01-23 DIAGNOSIS — K5909 Other constipation: Secondary | ICD-10-CM | POA: Diagnosis not present

## 2022-01-23 DIAGNOSIS — R1013 Epigastric pain: Secondary | ICD-10-CM | POA: Diagnosis not present

## 2022-01-23 DIAGNOSIS — K219 Gastro-esophageal reflux disease without esophagitis: Secondary | ICD-10-CM | POA: Diagnosis not present

## 2022-01-26 DIAGNOSIS — K5909 Other constipation: Secondary | ICD-10-CM | POA: Diagnosis not present

## 2022-01-26 DIAGNOSIS — K5730 Diverticulosis of large intestine without perforation or abscess without bleeding: Secondary | ICD-10-CM | POA: Diagnosis not present

## 2022-01-26 DIAGNOSIS — K648 Other hemorrhoids: Secondary | ICD-10-CM | POA: Diagnosis not present

## 2022-01-26 DIAGNOSIS — K635 Polyp of colon: Secondary | ICD-10-CM | POA: Diagnosis not present

## 2022-01-26 DIAGNOSIS — R1013 Epigastric pain: Secondary | ICD-10-CM | POA: Diagnosis not present

## 2022-01-26 DIAGNOSIS — K219 Gastro-esophageal reflux disease without esophagitis: Secondary | ICD-10-CM | POA: Diagnosis not present

## 2022-01-26 DIAGNOSIS — K2950 Unspecified chronic gastritis without bleeding: Secondary | ICD-10-CM | POA: Diagnosis not present

## 2022-01-26 DIAGNOSIS — K5900 Constipation, unspecified: Secondary | ICD-10-CM | POA: Diagnosis not present

## 2022-01-26 DIAGNOSIS — D123 Benign neoplasm of transverse colon: Secondary | ICD-10-CM | POA: Diagnosis not present

## 2022-02-05 DIAGNOSIS — M545 Low back pain, unspecified: Secondary | ICD-10-CM | POA: Diagnosis not present

## 2022-02-05 DIAGNOSIS — M25552 Pain in left hip: Secondary | ICD-10-CM | POA: Diagnosis not present

## 2022-02-05 DIAGNOSIS — M25512 Pain in left shoulder: Secondary | ICD-10-CM | POA: Diagnosis not present

## 2022-02-06 DIAGNOSIS — M7062 Trochanteric bursitis, left hip: Secondary | ICD-10-CM | POA: Diagnosis not present

## 2022-02-06 DIAGNOSIS — M47816 Spondylosis without myelopathy or radiculopathy, lumbar region: Secondary | ICD-10-CM | POA: Diagnosis not present

## 2022-02-06 DIAGNOSIS — M25552 Pain in left hip: Secondary | ICD-10-CM | POA: Diagnosis not present

## 2022-02-07 ENCOUNTER — Ambulatory Visit: Payer: Medicare Other | Attending: Orthopedic Surgery | Admitting: Physical Therapy

## 2022-02-07 ENCOUNTER — Encounter: Payer: Self-pay | Admitting: Physical Therapy

## 2022-02-07 ENCOUNTER — Other Ambulatory Visit: Payer: Self-pay

## 2022-02-07 DIAGNOSIS — M542 Cervicalgia: Secondary | ICD-10-CM

## 2022-02-07 DIAGNOSIS — M6281 Muscle weakness (generalized): Secondary | ICD-10-CM

## 2022-02-07 DIAGNOSIS — R252 Cramp and spasm: Secondary | ICD-10-CM | POA: Diagnosis not present

## 2022-02-07 DIAGNOSIS — M5416 Radiculopathy, lumbar region: Secondary | ICD-10-CM

## 2022-02-07 DIAGNOSIS — R262 Difficulty in walking, not elsewhere classified: Secondary | ICD-10-CM | POA: Diagnosis not present

## 2022-02-07 NOTE — Therapy (Signed)
[place]. [place], [place], [address] Phone: [phone]   Fax:  [phone]  Physical Therapy Evaluation  Patient Details  Name: Shannon Rosario MRN: [id number] Date of Birth: [birth year] Referring Provider (PT): Dr Edmonia Lynch, Laroy Apple   Encounter Date: 02/07/2022    Past Medical History:  Diagnosis Date   Anxiety    Arthritis    Depression    Diabetes mellitus without complication ([place])    Family history of adverse reaction to anesthesia    My Mother had some complications,I dont remember what    Fibromyalgia    GERD (gastroesophageal reflux disease)    Heart murmur    Hypercholesterolemia    Hypertension    Obesity    OSA (obstructive sleep apnea)    TIA (transient ischemic attack) 12/24/2010    Past Surgical History:  Procedure Laterality Date   [place] Left 1995   LEFT HEART CATH AND CORONARY ANGIOGRAPHY N/A 03/16/2021   Procedure: LEFT HEART CATH AND CORONARY ANGIOGRAPHY;  Surgeon: Nigel Mormon, MD;  Location: [facility];  Service: Cardiovascular;  Laterality: N/A;   [place]    There were no vitals filed for this visit.    Subjective Assessment - 02/07/22 1553     Subjective Patient reports she fell about 3 weeks ago, sustaining injuries to L hip, lower back, L shoulder, neck. She received an injection in her L shoulder for pain and inflammation on 2/13. No hip injection per Dr decision.    Pertinent History DDD    Limitations Sitting;Walking;Standing    How long can you sit comfortably? 15 minutes    How long can you stand comfortably? 5 minutes    How long can you walk comfortably? She can walk within her house, including up steps. She can go approximately 800', when pushing shopping cart.    Patient Stated Goals  Decrease pain.    Currently in Pain? Yes    Pain Score 7     Pain Location Hip    Pain Orientation Left    Pain Descriptors / Indicators Burning;Radiating    Pain Type Chronic pain    Pain Radiating Towards across spine.    Pain Onset 1 to 4 weeks ago    Pain Frequency Constant    Aggravating Factors  Sitting, standing, cross legs, lying on L side.    Pain Relieving Factors Gentle stretch, shifting weight off hip.    Multiple Pain Sites Yes    Pain Location Shoulder    Pain Descriptors / Indicators Aching    Pain Type Acute pain    Pain Radiating Towards across neck    Pain Onset 1 to 4 weeks ago    Pain Frequency Intermittent    Aggravating Factors  motion, lifting.    Pain Relieving Factors Received an injection 2 days ago, which has helped.                [facility] PT Assessment - 02/07/22 0001       Assessment   Medical Diagnosis Cervicalgia, Lumbar Radiculopathy    Referring Provider (PT) Dr Edmonia Lynch, Laroy Apple      Precautions   Precautions None      Restrictions   Weight Bearing Restrictions No  Balance Screen   Has the patient fallen in the past 6 months Yes    How many times? [place] residence    Living Arrangements Spouse/significant other    Type of [place] to enter    Entrance Stairs-Number of Steps 1    [place] Two level;Bed/bath upstairs      Prior Function   Vocation On disability    Leisure color/drawing, puzzles, play cards, reading, go to the movies      Cognition   Overall Cognitive Status Within Functional Limits for tasks assessed      ROM / Strength   AROM / PROM / Strength AROM;Strength      AROM   Overall AROM Comments BUE ROM WNL, B hips WFL, IR mildly limited B    AROM Assessment Site Cervical;Lumbar    Cervical Flexion 80%    Cervical Extension 80%    Cervical - Right Side Bend 70%    Cervical - Left Side Bend 60% paainful    Cervical -  Right Rotation 80%    Cervical - Left Rotation 80%    Lumbar Flexion 90%    Lumbar Extension 50%-painful    Lumbar - Right Side Bend 80%    Lumbar - Left Side Bend 60%    Lumbar - Right Rotation 80%    Lumbar - Left Rotation 80%      Strength   Overall Strength Comments B shoulder strenth limited by pain in neck with resistance.    Strength Assessment Site Hip    Right/Left Hip Right;Left    Right Hip Flexion 3+/5    Right Hip Extension 3/5    Right Hip External Rotation  4-/5    Right Hip Internal Rotation 4-/5    Right Hip ABduction 3+/5    Left Hip Flexion 3+/5    Left Hip Extension 3/5    Left Hip External Rotation 4-/5    Left Hip Internal Rotation 4-/5    Left Hip ABduction 3+/5      Flexibility   Soft Tissue Assessment /Muscle Length yes    Hamstrings B SLR to 70 degrees    ITB No tightness or tenderness      Palpation   Palpation comment Patient reported TTP over L gluts, QL, B up traps, cervical paraspinals, R SCM-increased tone and spasms noted in upper body areas of soreness.                        Objective measurements completed on examination: See above findings.                  PT Short Term Goals - 02/07/22 1726       PT SHORT TERM GOAL #1   Title Pt will be independent with initial HEP.    Time 4    Period Weeks    Status New    Target Date 03/07/22               PT Long Term Goals - 02/07/22 1727       PT LONG TERM GOAL #1   Title Pt will be independent with advanced HEP.    Time 8    Period Weeks    Status New    Target Date 04/04/22      PT LONG TERM GOAL #2   Title 5x  STS in < 12 sec to demonstrate imporved BLE strength and balance.    Time 8    Period Weeks    Status New    Target Date 04/04/22      PT LONG TERM GOAL #3   Title Patient will report LBP and cervicalgia pain of </+3/10 during all of her regular daily activities and mobility.    Baseline 7-8/10 max    Time 8    Period Weeks     Status New    Target Date 04/04/22      PT LONG TERM GOAL #4   Title Pt will demostrate increased in L hip and R shoulder strength to 4-/5 to 4/5 for improved proximal stability    Baseline (3+)-4-/5    Time 8    Period Weeks    Status New    Target Date 04/04/22      PT LONG TERM GOAL #5   Title Pt will report ability to walk for 15-20 minutes w/o reliance on UE/UB support on grocery cart w/o increased LBP to improve tolerance for grocery shopping    Baseline 10 minutes iwth cart, mildly increaed pain.    Time 8    Period Weeks    Status New    Target Date 04/04/22                    Plan - 02/07/22 1724     Clinical Impression Statement Patietn reports H/O multiple falls in May, which led to increased cervical and low back pain, weakness, decreased activity tolerance. She demosntrates decreased ROM, muscle tightness and TP in upper trunk, functional weakness. she will benefit from PT to release TP and muscle tightness, strengthen trunk and extremity muscles to decrease pain and minimize risk of return, as well as to improve functional mobility and Independence.    Personal Factors and Comorbidities Fitness    Examination-Activity Limitations Locomotion Level;Sleep;Stairs    Examination-Participation Restrictions Community Activity    Stability/Clinical Decision Making Evolving/Moderate complexity    Clinical Decision Making Moderate    Rehab Potential Good    PT Frequency 2x / week    PT Duration 8 weeks    PT Treatment/Interventions ADLs/Self Care Home Management;Cryotherapy;Electrical Stimulation;Iontophoresis 4mg /ml Dexamethasone;Moist Heat;Traction;Ultrasound;Gait training;Stair training;Functional mobility training;Therapeutic activities;Therapeutic exercise;Balance training;Neuromuscular re-education;Patient/family education;Manual techniques;Passive range of motion;Dry needling;Taping;Spinal Manipulations;Joint Manipulations    PT Next Visit Plan need to assess 5x  STS, review previous HEp and update as appropriate.    PT Home Exercise Plan Access Code: [email or web address], from previous admission    Consulted and Agree with Plan of Care Patient             Patient will benefit from skilled therapeutic intervention in order to improve the following deficits and impairments:  Difficulty walking, Decreased endurance, Increased muscle spasms, Obesity, Decreased activity tolerance, Pain, Impaired flexibility, Improper body mechanics, Decreased strength, Postural dysfunction, Decreased range of motion, Decreased mobility, Decreased balance  Visit Diagnosis: Difficulty in walking, not elsewhere classified  Lumbar radiculopathy  Muscle weakness (generalized)  Cramp and spasm  Cervicalgia     Problem List Patient Active Problem List   Diagnosis Date Noted   Angina pectoris, unstable ([place]) 08/11/2021   Non-restorative sleep 08/11/2021   Unstable angina ([place]) 03/16/2021   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   Fire ant bite 10/22/2016   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/2016   Allergic urticaria 10/22/2016   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 10/22/2016  Obstructive sleep apnea treated with continuous positive airway pressure (CPAP) 10/22/2016   Chest pain 09/21/2015   Essential hypertension 09/21/2015   Diabetes ([place]) 09/21/2015   Pain in the chest     Marcelina Morel, DPT 02/07/2022, 5:35 PM  [place]. [place], [place], [address] Phone: [phone]   Fax:  [phone]  Name: Shannon Rosario MRN: [id number] Date of Birth: [birth year]

## 2022-02-14 ENCOUNTER — Other Ambulatory Visit: Payer: Self-pay

## 2022-02-14 ENCOUNTER — Ambulatory Visit: Payer: Medicare Other | Admitting: Physical Therapy

## 2022-02-14 ENCOUNTER — Encounter: Payer: Self-pay | Admitting: Physical Therapy

## 2022-02-14 DIAGNOSIS — M542 Cervicalgia: Secondary | ICD-10-CM | POA: Diagnosis not present

## 2022-02-14 DIAGNOSIS — M6281 Muscle weakness (generalized): Secondary | ICD-10-CM

## 2022-02-14 DIAGNOSIS — R262 Difficulty in walking, not elsewhere classified: Secondary | ICD-10-CM | POA: Diagnosis not present

## 2022-02-14 DIAGNOSIS — M5416 Radiculopathy, lumbar region: Secondary | ICD-10-CM | POA: Diagnosis not present

## 2022-02-14 DIAGNOSIS — R252 Cramp and spasm: Secondary | ICD-10-CM

## 2022-02-14 NOTE — Therapy (Signed)
[place]. [place], [place], [address] Phone: [phone]   Fax:  [phone]  Physical Therapy Treatment  Patient Details  Name: Shannon Rosario MRN: [id number] Date of Birth: 12-16-1961 Referring Provider (PT): Dr Edmonia Lynch, Laroy Apple   Encounter Date: 02/14/2022   PT End of Session - 02/14/22 1014     Visit Number 2    Date for PT Re-Evaluation 04/04/22    PT Start Time 0934    PT Stop Time 1012    PT Time Calculation (min) 38 min    Activity Tolerance Patient tolerated treatment well    Behavior During Therapy [facility] for tasks assessed/performed             Past Medical History:  Diagnosis Date   Anxiety    Arthritis    Depression    Diabetes mellitus without complication ([place])    Family history of adverse reaction to anesthesia    My Mother had some complications,I dont remember what    Fibromyalgia    GERD (gastroesophageal reflux disease)    Heart murmur    Hypercholesterolemia    Hypertension    Obesity    OSA (obstructive sleep apnea)    TIA (transient ischemic attack) 12/24/2010    Past Surgical History:  Procedure Laterality Date   [place] Left 1995   LEFT HEART CATH AND CORONARY ANGIOGRAPHY N/A 03/16/2021   Procedure: LEFT HEART CATH AND CORONARY ANGIOGRAPHY;  Surgeon: Nigel Mormon, MD;  Location: [facility];  Service: Cardiovascular;  Laterality: N/A;   [place]    There were no vitals filed for this visit.   Subjective Assessment - 02/14/22 0938     Subjective Everything is a little sore but the left shoulder is the worst, that's the one I landed on.I'd like to focus on my shoulder pain today more than anything else    Pertinent History DDD    Patient Stated Goals Decrease pain.    Currently in Pain? Yes     Pain Score 6     Pain Location Other (Comment)   generalized   Pain Orientation Other (Comment)   generalized   Pain Descriptors / Indicators Other (Comment)   L shoulder is stabbing, hips are stabbing, back is jabbing   Pain Type Chronic pain                [facility] PT Assessment - 02/14/22 0001       Transfers   Five time sit to stand comments  14 seconds                           [facility] Adult PT Treatment/Exercise - 02/14/22 0001       Neck Exercises: Machines for Strengthening   Cybex Row 15# x10    Lat Pull 15# x10      Neck Exercises: Seated   Neck Retraction 10 reps;3 secs    Cervical Rotation 10 reps;Both    Cervical Rotation Limitations with chin tuck    Lateral Flexion 10 reps;Both    Lateral Flexion Limitations with chin tuck      Neck Exercises: Supine   Other Supine Exercise L shoulder AROM flexion/ABD 1x10  Manual Therapy   Manual Therapy Soft tissue mobilization    Soft tissue mobilization L upper trap, rotator cuff, rhomboids, lats                     PT Education - 02/14/22 1014     Education Details HEP updates    Person(s) Educated Patient    Methods Explanation    Comprehension Verbalized understanding              PT Short Term Goals - 02/07/22 1726       PT SHORT TERM GOAL #1   Title Pt will be independent with initial HEP.    Time 4    Period Weeks    Status New    Target Date 03/07/22               PT Long Term Goals - 02/07/22 1727       PT LONG TERM GOAL #1   Title Pt will be independent with advanced HEP.    Time 8    Period Weeks    Status New    Target Date 04/04/22      PT LONG TERM GOAL #2   Title 5x STS in < 12 sec to demonstrate imporved BLE strength and balance.    Time 8    Period Weeks    Status New    Target Date 04/04/22      PT LONG TERM GOAL #3   Title Patient will report LBP and cervicalgia pain of </+3/10 during all of her regular daily activities and  mobility.    Baseline 7-8/10 max    Time 8    Period Weeks    Status New    Target Date 04/04/22      PT LONG TERM GOAL #4   Title Pt will demostrate increased in L hip and R shoulder strength to 4-/5 to 4/5 for improved proximal stability    Baseline (3+)-4-/5    Time 8    Period Weeks    Status New    Target Date 04/04/22      PT LONG TERM GOAL #5   Title Pt will report ability to walk for 15-20 minutes w/o reliance on UE/UB support on grocery cart w/o increased LBP to improve tolerance for grocery shopping    Baseline 10 minutes iwth cart, mildly increaed pain.    Time 8    Period Weeks    Status New    Target Date 04/04/22                   Plan - 02/14/22 1014     Clinical Impression Statement Shannon Rosario arrives today OK, still having pain all over with L shoulder being the worst and she would like PT to focus on this today. Spent some time working on AROM as well as postural training and finished with STM to posterior L shoulder. Might eventually benefit from ionto but since she recently had a shoulder injection I think we should wait a bit longer before trying this. Had a lot of trigger points in L rotator cuff, able to reduce pain significantly with STM to this area. Will continue to progress as able and tolerated.    Personal Factors and Comorbidities Fitness    Examination-Activity Limitations Locomotion Level;Sleep;Stairs    Examination-Participation Restrictions Community Activity    Stability/Clinical Decision Making Evolving/Moderate complexity    Clinical Decision Making Moderate    Rehab Potential Good  PT Frequency 2x / week    PT Duration 8 weeks    PT Treatment/Interventions ADLs/Self Care Home Management;Cryotherapy;Electrical Stimulation;Iontophoresis 4mg /ml Dexamethasone;Moist Heat;Traction;Ultrasound;Gait training;Stair training;Functional mobility training;Therapeutic activities;Therapeutic exercise;Balance training;Neuromuscular  re-education;Patient/family education;Manual techniques;Passive range of motion;Dry needling;Taping;Spinal Manipulations;Joint Manipulations    PT Next Visit Plan how did she feel after rotator cuff STM? consider DN to L shoulder? Progress as appropirate otherwise    Consulted and Agree with Plan of Care Patient             Patient will benefit from skilled therapeutic intervention in order to improve the following deficits and impairments:  Difficulty walking, Decreased endurance, Increased muscle spasms, Obesity, Decreased activity tolerance, Pain, Impaired flexibility, Improper body mechanics, Decreased strength, Postural dysfunction, Decreased range of motion, Decreased mobility, Decreased balance  Visit Diagnosis: Difficulty in walking, not elsewhere classified  Lumbar radiculopathy  Muscle weakness (generalized)  Cramp and spasm  Cervicalgia     Problem List Patient Active Problem List   Diagnosis Date Noted   Angina pectoris, unstable ([place]) 08/11/2021   Non-restorative sleep 08/11/2021   Unstable angina ([place]) 03/16/2021   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   Fire ant bite 10/22/2016   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/2016   Allergic urticaria 10/22/2016   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 10/22/2016   Obstructive sleep apnea treated with continuous positive airway pressure (CPAP) 10/22/2016   Chest pain 09/21/2015   Essential hypertension 09/21/2015   Diabetes ([place]) 09/21/2015   Pain in the chest    Ann Lions PT, DPT, PN2   Supplemental Physical Therapist [place]. [place], [place], [address] Phone: [phone]   Fax:  [phone]  Name: Shannon Rosario MRN: [id number] Date of Birth: 1961/06/16

## 2022-02-16 ENCOUNTER — Other Ambulatory Visit: Payer: Self-pay

## 2022-02-16 ENCOUNTER — Ambulatory Visit: Payer: Medicare Other | Admitting: Physical Therapy

## 2022-02-16 DIAGNOSIS — R262 Difficulty in walking, not elsewhere classified: Secondary | ICD-10-CM | POA: Diagnosis not present

## 2022-02-16 DIAGNOSIS — M542 Cervicalgia: Secondary | ICD-10-CM | POA: Diagnosis not present

## 2022-02-16 DIAGNOSIS — R252 Cramp and spasm: Secondary | ICD-10-CM | POA: Diagnosis not present

## 2022-02-16 DIAGNOSIS — M6281 Muscle weakness (generalized): Secondary | ICD-10-CM

## 2022-02-16 DIAGNOSIS — M5416 Radiculopathy, lumbar region: Secondary | ICD-10-CM | POA: Diagnosis not present

## 2022-02-16 NOTE — Therapy (Signed)
[place]. [place], [place], [address] Phone: [phone]   Fax:  [phone]  Physical Therapy Treatment  Patient Details  Name: Shannon Rosario MRN: [id number] Date of Birth: 01-Jul-1961 Referring Provider (PT): Dr Edmonia Lynch, Laroy Apple   Encounter Date: 02/16/2022   PT End of Session - 02/16/22 0834     Visit Number 3    Date for PT Re-Evaluation 04/04/22    PT Start Time 0800    PT Stop Time 0845    PT Time Calculation (min) 45 min             Past Medical History:  Diagnosis Date   Anxiety    Arthritis    Depression    Diabetes mellitus without complication ([place])    Family history of adverse reaction to anesthesia    My Mother had some complications,I dont remember what    Fibromyalgia    GERD (gastroesophageal reflux disease)    Heart murmur    Hypercholesterolemia    Hypertension    Obesity    OSA (obstructive sleep apnea)    TIA (transient ischemic attack) 12/24/2010    Past Surgical History:  Procedure Laterality Date   [place] Left 1995   LEFT HEART CATH AND CORONARY ANGIOGRAPHY N/A 03/16/2021   Procedure: LEFT HEART CATH AND CORONARY ANGIOGRAPHY;  Surgeon: Nigel Mormon, MD;  Location: [facility];  Service: Cardiovascular;  Laterality: N/A;   [place]    There were no vitals filed for this visit.   Subjective Assessment - 02/16/22 0801     Subjective worked on left shld last time and its like everything shifted to the RT . back and hip stiff but pain is shld L>RT and cerv    Currently in Pain? Yes    Pain Score 6                                [facility] Adult PT Treatment/Exercise - 02/16/22 0001       Neck Exercises: Machines for Strengthening   Lat Pull 15# x10  2 sets 10      Neck  Exercises: Standing   Wall Push Ups 5 reps   with ball 5x CW and CCW   Other Standing Exercises ER red 2x10   row and shld ext 2 sets 10 red tband   Other Standing Exercises 4# shld shrugg, roll and shld ext 10 x each      Neck Exercises: Seated   Neck Retraction 15 reps;3 secs   head on ball on wall     Lumbar Exercises: Aerobic   UBE (Upper Arm Bike) L 2 2 min fwd /2 min back    Nustep L 5 6 min      Lumbar Exercises: Machines for Strengthening   Cybex Lumbar Extension black band 2x10      Modalities   Modalities Moist Heat      Moist Heat Therapy   Number Minutes Moist Heat 10 Minutes    Moist Heat Location Lumbar Spine;Cervical      Electrical Stimulation   Electrical Stimulation Location cerv    Electrical Stimulation Action IFC    Electrical Stimulation Parameters supine  Electrical Stimulation Goals Pain                       PT Short Term Goals - 02/16/22 3428       PT SHORT TERM GOAL #1   Title Pt will be independent with initial HEP.    Status Achieved               PT Long Term Goals - 02/07/22 1727       PT LONG TERM GOAL #1   Title Pt will be independent with advanced HEP.    Time 8    Period Weeks    Status New    Target Date 04/04/22      PT LONG TERM GOAL #2   Title 5x STS in < 12 sec to demonstrate imporved BLE strength and balance.    Time 8    Period Weeks    Status New    Target Date 04/04/22      PT LONG TERM GOAL #3   Title Patient will report LBP and cervicalgia pain of </+3/10 during all of her regular daily activities and mobility.    Baseline 7-8/10 max    Time 8    Period Weeks    Status New    Target Date 04/04/22      PT LONG TERM GOAL #4   Title Pt will demostrate increased in L hip and R shoulder strength to 4-/5 to 4/5 for improved proximal stability    Baseline (3+)-4-/5    Time 8    Period Weeks    Status New    Target Date 04/04/22      PT LONG TERM GOAL #5   Title Pt will report ability to  walk for 15-20 minutes w/o reliance on UE/UB support on grocery cart w/o increased LBP to improve tolerance for grocery shopping    Baseline 10 minutes iwth cart, mildly increaed pain.    Time 8    Period Weeks    Status New    Target Date 04/04/22                   Plan - 02/16/22 0834     Clinical Impression Statement pt arrived with most pain in shlds and cerv.so these areas were the focus of stab ex with cuing. added estim and MH today as areas of pain where more wide spread. STG met    PT Treatment/Interventions ADLs/Self Care Home Management;Cryotherapy;Electrical Stimulation;Iontophoresis 23m/ml Dexamethasone;Moist Heat;Traction;Ultrasound;Gait training;Stair training;Functional mobility training;Therapeutic activities;Therapeutic exercise;Balance training;Neuromuscular re-education;Patient/family education;Manual techniques;Passive range of motion;Dry needling;Taping;Spinal Manipulations;Joint Manipulations    PT Next Visit Plan progress stab ex. consider DN as pt is very tight             Patient will benefit from skilled therapeutic intervention in order to improve the following deficits and impairments:  Difficulty walking, Decreased endurance, Increased muscle spasms, Obesity, Decreased activity tolerance, Pain, Impaired flexibility, Improper body mechanics, Decreased strength, Postural dysfunction, Decreased range of motion, Decreased mobility, Decreased balance  Visit Diagnosis: Muscle weakness (generalized)  Cramp and spasm  Cervicalgia     Problem List Patient Active Problem List   Diagnosis Date Noted   Angina pectoris, unstable ([place] 08/11/2021   Non-restorative sleep 08/11/2021   Unstable angina ([place] 03/16/2021   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   Fire ant bite 10/22/2016   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/2016   Allergic urticaria 10/22/2016   Chronic seasonal  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 10/22/2016    Obstructive sleep apnea treated with continuous positive airway pressure (CPAP) 10/22/2016   Chest pain 09/21/2015   Essential hypertension 09/21/2015   Diabetes ([place]) 09/21/2015   Pain in the chest     Teagyn Fishel,ANGIE, PTA 02/16/2022, 8:36 AM  [place]. [place], [place], [address] Phone: [phone]   Fax:  [phone]  Name: Kerington Hildebrant MRN: [id number] Date of Birth: 1961/02/03

## 2022-02-20 ENCOUNTER — Ambulatory Visit: Payer: Medicare Other | Admitting: Physical Therapy

## 2022-02-20 ENCOUNTER — Encounter: Payer: Self-pay | Admitting: Physical Therapy

## 2022-02-20 ENCOUNTER — Other Ambulatory Visit: Payer: Self-pay

## 2022-02-20 DIAGNOSIS — M5416 Radiculopathy, lumbar region: Secondary | ICD-10-CM

## 2022-02-20 DIAGNOSIS — R252 Cramp and spasm: Secondary | ICD-10-CM | POA: Diagnosis not present

## 2022-02-20 DIAGNOSIS — M6281 Muscle weakness (generalized): Secondary | ICD-10-CM | POA: Diagnosis not present

## 2022-02-20 DIAGNOSIS — R262 Difficulty in walking, not elsewhere classified: Secondary | ICD-10-CM | POA: Diagnosis not present

## 2022-02-20 DIAGNOSIS — M542 Cervicalgia: Secondary | ICD-10-CM

## 2022-02-20 NOTE — Therapy (Signed)
[place]. [place], [place], [address] Phone: [phone]   Fax:  [phone]  Physical Therapy Treatment  Patient Details  Name: Shannon Rosario MRN: [id number] Date of Birth: 1961-09-07 Referring Provider (PT): Dr Edmonia Lynch, Laroy Apple   Encounter Date: 02/20/2022   PT End of Session - 02/20/22 1614     Visit Number 4    Date for PT Re-Evaluation 04/04/22    PT Start Time 1537   arrived a few minutes late   PT Stop Time 1614    PT Time Calculation (min) 37 min    Activity Tolerance Patient tolerated treatment well    Behavior During Therapy [facility] for tasks assessed/performed             Past Medical History:  Diagnosis Date   Anxiety    Arthritis    Depression    Diabetes mellitus without complication ([place])    Family history of adverse reaction to anesthesia    My Mother had some complications,I dont remember what    Fibromyalgia    GERD (gastroesophageal reflux disease)    Heart murmur    Hypercholesterolemia    Hypertension    Obesity    OSA (obstructive sleep apnea)    TIA (transient ischemic attack) 12/24/2010    Past Surgical History:  Procedure Laterality Date   [place] Left 1995   LEFT HEART CATH AND CORONARY ANGIOGRAPHY N/A 03/16/2021   Procedure: LEFT HEART CATH AND CORONARY ANGIOGRAPHY;  Surgeon: Nigel Mormon, MD;  Location: [facility];  Service: Cardiovascular;  Laterality: N/A;   [place]    There were no vitals filed for this visit.   Subjective Assessment - 02/20/22 1539     Subjective I'm just dragging today, this time is late for me; I'm just sleepy right now    Patient Stated Goals Decrease pain.    Currently in Pain? Yes    Pain Score 5     Pain Location Shoulder    Pain Orientation Right    Pain  Descriptors / Indicators Aching    Pain Type Acute pain                               [facility] Adult PT Treatment/Exercise - 02/20/22 0001       Neck Exercises: Machines for Strengthening   Cybex Row 15#x15, 20# x5    Lat Pull 15# x15, 20# x5    Other Machines for Strengthening shoulder extensions 10# 1x15      Neck Exercises: Supine   Other Supine Exercise thoracic extension stretch x2 minutes over half foam roll; then UE flexion x10 for increased ROM      Neck Exercises: Stretches   Other Neck Stretches R shoulder horizontal ADD stretch 2x10 seconds      Lumbar Exercises: Stretches   Other Lumbar Stretch Exercise lumbar rotation strech to L 2x20 seconds      Lumbar Exercises: Aerobic   Nustep L 5 6 minutes BLEs only      Lumbar Exercises: Supine   Pelvic Tilt 10 reps    Pelvic Tilt Limitations PPT 3 second holds    Bridge 15 reps;3 seconds    Other Supine Lumbar  Exercises PPT with alternating marches 1x10 B, bent leg raises with PPT 1x5                     PT Education - 02/20/22 1613     Education Details exercise form/purpose    Person(s) Educated Patient    Methods Explanation    Comprehension Verbalized understanding              PT Short Term Goals - 02/16/22 5284       PT SHORT TERM GOAL #1   Title Pt will be independent with initial HEP.    Status Achieved               PT Long Term Goals - 02/07/22 1727       PT LONG TERM GOAL #1   Title Pt will be independent with advanced HEP.    Time 8    Period Weeks    Status New    Target Date 04/04/22      PT LONG TERM GOAL #2   Title 5x STS in < 12 sec to demonstrate imporved BLE strength and balance.    Time 8    Period Weeks    Status New    Target Date 04/04/22      PT LONG TERM GOAL #3   Title Patient will report LBP and cervicalgia pain of </+3/10 during all of her regular daily activities and mobility.    Baseline 7-8/10 max    Time 8    Period Weeks     Status New    Target Date 04/04/22      PT LONG TERM GOAL #4   Title Pt will demostrate increased in L hip and R shoulder strength to 4-/5 to 4/5 for improved proximal stability    Baseline (3+)-4-/5    Time 8    Period Weeks    Status New    Target Date 04/04/22      PT LONG TERM GOAL #5   Title Pt will report ability to walk for 15-20 minutes w/o reliance on UE/UB support on grocery cart w/o increased LBP to improve tolerance for grocery shopping    Baseline 10 minutes iwth cart, mildly increaed pain.    Time 8    Period Weeks    Status New    Target Date 04/04/22                   Plan - 02/20/22 1614     Clinical Impression Statement Shannon Rosario arrives a bit late today doing OK, just really sleepy today. R  shoulder is feeling it the most, low back feels pretty OK today. Warmed up on Nustep, otherwise worked on postural strength and stability as well as core stabilization this session.  Will continue to progress as tolerated.    Personal Factors and Comorbidities Fitness    Examination-Activity Limitations Locomotion Level;Sleep;Stairs    Examination-Participation Restrictions Community Activity    Stability/Clinical Decision Making Evolving/Moderate complexity    Clinical Decision Making Moderate    Rehab Potential Good    PT Frequency 2x / week    PT Duration 8 weeks    PT Treatment/Interventions ADLs/Self Care Home Management;Cryotherapy;Electrical Stimulation;Iontophoresis 4mg /ml Dexamethasone;Moist Heat;Traction;Ultrasound;Gait training;Stair training;Functional mobility training;Therapeutic activities;Therapeutic exercise;Balance training;Neuromuscular re-education;Patient/family education;Manual techniques;Passive range of motion;Dry needling;Taping;Spinal Manipulations;Joint Manipulations    PT Next Visit Plan progress stab ex. consider DN as pt is very tight    PT Home Exercise Plan Access Code:  [email or web address], from previous admission    Consulted and Agree with  Plan of Care Patient             Patient will benefit from skilled therapeutic intervention in order to improve the following deficits and impairments:  Difficulty walking, Decreased endurance, Increased muscle spasms, Obesity, Decreased activity tolerance, Pain, Impaired flexibility, Improper body mechanics, Decreased strength, Postural dysfunction, Decreased range of motion, Decreased mobility, Decreased balance  Visit Diagnosis: Muscle weakness (generalized)  Cramp and spasm  Cervicalgia  Difficulty in walking, not elsewhere classified  Lumbar radiculopathy     Problem List Patient Active Problem List   Diagnosis Date Noted   Angina pectoris, unstable ([place]) 08/11/2021   Non-restorative sleep 08/11/2021   Unstable angina ([place]) 03/16/2021   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   Fire ant bite 10/22/2016   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/2016   Allergic urticaria 10/22/2016   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 10/22/2016   Obstructive sleep apnea treated with continuous positive airway pressure (CPAP) 10/22/2016   Chest pain 09/21/2015   Essential hypertension 09/21/2015   Diabetes ([place]) 09/21/2015   Pain in the chest    Ann Lions PT, DPT, PN2   Supplemental Physical Therapist [place]. [place], [place], [address] Phone: [phone]   Fax:  [phone]  Name: Shannon Rosario MRN: [id number] Date of Birth: June 24, 1961

## 2022-02-22 ENCOUNTER — Other Ambulatory Visit: Payer: Self-pay

## 2022-02-22 ENCOUNTER — Ambulatory Visit: Payer: Medicare Other | Attending: Orthopedic Surgery | Admitting: Physical Therapy

## 2022-02-22 ENCOUNTER — Encounter: Payer: Self-pay | Admitting: Physical Therapy

## 2022-02-22 DIAGNOSIS — R252 Cramp and spasm: Secondary | ICD-10-CM | POA: Insufficient documentation

## 2022-02-22 DIAGNOSIS — M6281 Muscle weakness (generalized): Secondary | ICD-10-CM | POA: Insufficient documentation

## 2022-02-22 DIAGNOSIS — M542 Cervicalgia: Secondary | ICD-10-CM | POA: Insufficient documentation

## 2022-02-22 DIAGNOSIS — R262 Difficulty in walking, not elsewhere classified: Secondary | ICD-10-CM | POA: Insufficient documentation

## 2022-02-22 DIAGNOSIS — M5416 Radiculopathy, lumbar region: Secondary | ICD-10-CM | POA: Insufficient documentation

## 2022-02-22 NOTE — Therapy (Signed)
[facility] ?[address] [place] ?[address] ?[place], [place], [address] ?Phone: [phone]   Fax:  [phone] ? ?Physical Therapy Treatment ? ?Patient Details  ?Name: Shannon Rosario ?MRN: [id number] ?Date of Birth: [birth year] ?Referring Provider (PT): Dr Edmonia Lynch, Laroy Apple ? ? ?Encounter Date: 02/22/2022 ? ? PT End of Session - 02/22/22 1614   ? ? Visit Number 5   ? Date for PT Re-Evaluation 04/04/22   ? PT Start Time 1532   ? PT Stop Time 1612   ? PT Time Calculation (min) 40 min   ? Activity Tolerance Patient tolerated treatment well   ? Behavior During Therapy [facility] for tasks assessed/performed   ? ?  ?  ? ?  ? ? ?Past Medical History:  ?Diagnosis Date  ? Anxiety   ? Arthritis   ? Depression   ? Diabetes mellitus without complication ([place])   ? Family history of adverse reaction to anesthesia   ? My Mother had some complications,I dont remember what   ? Fibromyalgia   ? GERD (gastroesophageal reflux disease)   ? Heart murmur   ? Hypercholesterolemia   ? Hypertension   ? Obesity   ? OSA (obstructive sleep apnea)   ? TIA (transient ischemic attack) 12/24/2010  ? ? ?Past Surgical History:  ?Procedure Laterality Date  ? APPENDECTOMY    ? [place]  ? [place]  ? CHOLECYSTECTOMY  1984  ? KNEE SURGERY Left 1995  ? LEFT HEART CATH AND CORONARY ANGIOGRAPHY N/A 03/16/2021  ? Procedure: LEFT HEART CATH AND CORONARY ANGIOGRAPHY;  Surgeon: Nigel Mormon, MD;  Location: [facility];  Service: Cardiovascular;  Laterality: N/A;  ? NASAL SINUS SURGERY  1992  ? TONSILLECTOMY  1968  ? ? ?There were no vitals filed for this visit. ? ? Subjective Assessment - 02/22/22 1531   ? ? Subjective Patient reports she is tired again today, she struggles with this time of day. She reports no pain, just stiffness.   ? Pertinent History DDD   ? Limitations Sitting;Walking;Standing   ? Currently in Pain? No/denies   ? ?  ?  ? ?   ? ? ? ? ? ? ? ? ? ? ? ? ? ? ? ? ? ? ? ? [facility] Adult PT Treatment/Exercise - 02/22/22 0001   ? ?  ? Neck Exercises: Machines for Strengthening  ? Cybex Row 35# 2 x 10 reps   ? Lat Pull 35# 2 x 10 reps   ? Other Machines for Strengthening B shoulder ext 2 x 10 reps, 15# each   ?  ? Neck Exercises: Theraband  ? Scapula Retraction 10 reps;Other (comment)   ? Scapula Retraction Limitations 15# resistance. Reauired mod TC to be able to complete correctly.   ?  ? Lumbar Exercises: Stretches  ? Single Knee to Chest Stretch Right;Left;3 reps;10 seconds   ? Double Knee to Chest Stretch 3 reps;10 seconds   ? Lower Trunk Rotation 3 reps;10 seconds   ?  ? Lumbar Exercises: Supine  ? Bridge 10 reps;3 seconds   ? Bridge Limitations Bridge with ball roll side to side x3, 5 reps.   ? Bridge with [employer];10 reps;3 seconds   ?  ? Manual Therapy  ? Manual Therapy Soft tissue mobilization;Passive ROM   ? Soft tissue mobilization R Rhomboids, upper trap, deep pressure using tennis ball.   ? Passive ROM stretch to medial border  of R scapula.   ? ?  ?  ? ?  ? ? ? ? ? ? ? ? ? ? PT Education - 02/22/22 1614   ? ? Education Details Self deep tissue mobs using tennis ball.   ? Person(s) Educated Patient   ? Methods Explanation;Demonstration   ? Comprehension Verbalized understanding;Returned demonstration   ? ?  ?  ? ?  ? ? ? PT Short Term Goals - 02/16/22 0821   ? ?  ? PT SHORT TERM GOAL #1  ? Title Pt will be independent with initial HEP.   ? Status Achieved   ? ?  ?  ? ?  ? ? ? ? PT Long Term Goals - 02/22/22 1615   ? ?  ? PT LONG TERM GOAL #1  ? Title Pt will be independent with advanced HEP.   ? Time 8   ? Period Weeks   ? Status New   ? Target Date 04/04/22   ?  ? PT LONG TERM GOAL #2  ? Title 5x STS in < 12 sec to demonstrate imporved BLE strength and balance.   ? Time 8   ? Period Weeks   ? Status New   ? Target Date 04/04/22   ?  ? PT LONG TERM GOAL #3  ? Title Patient will report LBP and cervicalgia pain of </+3/10  during all of her regular daily activities and mobility.   ? Baseline 5-6   ? Time 6   ? Period Weeks   ? Status On-going   ? Target Date 04/04/22   ?  ? PT LONG TERM GOAL #4  ? Title Pt will demostrate increased in L hip and R shoulder strength to 4-/5 to 4/5 for improved proximal stability   ? Baseline (3+)-4-/5   ? Time 8   ? Period Weeks   ? Status New   ? Target Date 04/04/22   ?  ? PT LONG TERM GOAL #5  ? Title Pt will report ability to walk for 15-20 minutes w/o reliance on UE/UB support on grocery cart w/o increased LBP to improve tolerance for grocery shopping   ? Baseline 10 minutes iwth cart, mildly increaed pain.   ? Time 8   ? Period Weeks   ? Status New   ? Target Date 04/04/22   ? ?  ?  ? ?  ? ? ? ? ? ? ? ? Plan - 02/22/22 1534   ? ? Clinical Impression Statement Patient reports she struggles in the afternoon and is lethargic. She reports stiffness in low back and L shoulder girdle. Treatment focused on mobility followed by stabilization of trunk to improve control and decrease pain.   ? Personal Factors and Comorbidities Fitness   ? Examination-Activity Limitations Locomotion Level;Sleep;Stairs   ? Examination-Participation Restrictions Community Activity   ? Clinical Decision Making Moderate   ? Rehab Potential Good   ? PT Frequency 2x / week   ? PT Duration 8 weeks   ? PT Treatment/Interventions ADLs/Self Care Home Management;Cryotherapy;Electrical Stimulation;Iontophoresis 4mg /ml Dexamethasone;Moist Heat;Traction;Ultrasound;Gait training;Stair training;Functional mobility training;Therapeutic activities;Therapeutic exercise;Balance training;Neuromuscular re-education;Patient/family education;Manual techniques;Passive range of motion;Dry needling;Taping;Spinal Manipulations;Joint Manipulations   ? PT Next Visit Plan progress stab ex. consider DN as pt is very tight   ? Consulted and Agree with Plan of Care Patient   ? ?  ?  ? ?  ? ? ?Patient will benefit from skilled therapeutic intervention in  order to improve the following deficits  and impairments:  Difficulty walking, Decreased endurance, Increased muscle spasms, Obesity, Decreased activity tolerance, Pain, Impaired flexibility, Improper body mechanics, Decreased strength, Postural dysfunction, Decreased range of motion, Decreased mobility, Decreased balance ? ?Visit Diagnosis: ?Muscle weakness (generalized) ? ?Cramp and spasm ? ?Cervicalgia ? ?Difficulty in walking, not elsewhere classified ? ?Lumbar radiculopathy ? ? ? ? ?Problem List ?Patient Active Problem List  ? Diagnosis Date Noted  ? Angina pectoris, unstable ([place]) 08/11/2021  ? Non-restorative sleep 08/11/2021  ? Unstable angina ([place]) 03/16/2021  ? 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  ? Fire ant bite 10/22/2016  ? 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/2016  ? Allergic urticaria 10/22/2016  ? 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 Obstructive sleep apnea treated with continuous positive airway pressure (CPAP) 10/22/2016  ? Chest pain 09/21/2015  ? Essential hypertension 09/21/2015  ? Diabetes ([place]) 09/21/2015  ? Pain in the chest   ? ? ?Marcelina Morel, DPT ?02/22/2022, 4:17 PM ? ?[facility] ?[address] ?[address] ?[place], [place], [address] ?Phone: [phone]   Fax:  [phone] ? ?Name: Zenab Gronewold ?MRN: [id number] ?Date of Birth: February 24, 1961 ? ? ? ?

## 2022-02-27 ENCOUNTER — Other Ambulatory Visit: Payer: Self-pay

## 2022-02-27 ENCOUNTER — Encounter: Payer: Self-pay | Admitting: Physical Therapy

## 2022-02-27 ENCOUNTER — Ambulatory Visit: Payer: Medicare Other | Admitting: Physical Therapy

## 2022-02-27 DIAGNOSIS — M5416 Radiculopathy, lumbar region: Secondary | ICD-10-CM

## 2022-02-27 DIAGNOSIS — M542 Cervicalgia: Secondary | ICD-10-CM | POA: Diagnosis not present

## 2022-02-27 DIAGNOSIS — R252 Cramp and spasm: Secondary | ICD-10-CM | POA: Diagnosis not present

## 2022-02-27 DIAGNOSIS — M6281 Muscle weakness (generalized): Secondary | ICD-10-CM | POA: Diagnosis not present

## 2022-02-27 DIAGNOSIS — R262 Difficulty in walking, not elsewhere classified: Secondary | ICD-10-CM

## 2022-02-27 NOTE — Therapy (Addendum)
[facility] ?[place] ?[address] ?[place], [place], [address] ?Phone: [phone]   Fax:  [phone] ? ?Physical Therapy Treatment ? ?Patient Details  ?Name: Shannon Rosario ?MRN: [id number] ?Date of Birth: 1961/05/28 ?Referring Provider (PT): Dr Edmonia Lynch, Laroy Apple ? ? ?Encounter Date: 02/27/2022 ? ? ? ? ?Past Medical History:  ?Diagnosis Date  ? Anxiety   ? Arthritis   ? Depression   ? Diabetes mellitus without complication ([place])   ? Family history of adverse reaction to anesthesia   ? My Mother had some complications,I dont remember what   ? Fibromyalgia   ? GERD (gastroesophageal reflux disease)   ? Heart murmur   ? Hypercholesterolemia   ? Hypertension   ? Obesity   ? OSA (obstructive sleep apnea)   ? TIA (transient ischemic attack) 12/24/2010  ? ? ?Past Surgical History:  ?Procedure Laterality Date  ? APPENDECTOMY    ? [place]  ? [place]  ? CHOLECYSTECTOMY  1984  ? KNEE SURGERY Left 1995  ? LEFT HEART CATH AND CORONARY ANGIOGRAPHY N/A 03/16/2021  ? Procedure: LEFT HEART CATH AND CORONARY ANGIOGRAPHY;  Surgeon: Nigel Mormon, MD;  Location: [facility];  Service: Cardiovascular;  Laterality: N/A;  ? NASAL SINUS SURGERY  1992  ? TONSILLECTOMY  1968  ? ? ?There were no vitals filed for this visit. ? ? ? ? ? ? ? ? ? ? ? ? ? ? ? ? ? ? ? ? ? ? ? ? ? ? ? ? ? ? ? ? ? ? PT Short Term Goals - 02/16/22 0821   ? ?  ? PT SHORT TERM GOAL #1  ? Title Pt will be independent with initial HEP.   ? Status Achieved   ? ?  ?  ? ?  ? ? ? ? PT Long Term Goals - 02/22/22 1615   ? ?  ? PT LONG TERM GOAL #1  ? Title Pt will be independent with advanced HEP.   ? Time 8   ? Period Weeks   ? Status New   ? Target Date 04/04/22   ?  ? PT LONG TERM GOAL #2  ? Title 5x STS in < 12 sec to demonstrate imporved BLE strength and balance.   ? Time 8   ? Period Weeks   ? Status New   ? Target Date 04/04/22   ?  ? PT LONG TERM GOAL #3  ?  Title Patient will report LBP and cervicalgia pain of </+3/10 during all of her regular daily activities and mobility.   ? Baseline 5-6   ? Time 6   ? Period Weeks   ? Status On-going   ? Target Date 04/04/22   ?  ? PT LONG TERM GOAL #4  ? Title Pt will demostrate increased in L hip and R shoulder strength to 4-/5 to 4/5 for improved proximal stability   ? Baseline (3+)-4-/5   ? Time 8   ? Period Weeks   ? Status New   ? Target Date 04/04/22   ?  ? PT LONG TERM GOAL #5  ? Title Pt will report ability to walk for 15-20 minutes w/o reliance on UE/UB support on grocery cart w/o increased LBP to improve tolerance for grocery shopping   ? Baseline 10 minutes iwth cart, mildly increaed pain.   ? Time 8   ? Period Weeks   ?  Status New   ? Target Date 04/04/22   ? ?  ?  ? ?  ? ? ? ? ? ? ? ? ? ? ?Patient will benefit from skilled therapeutic intervention in order to improve the following deficits and impairments:  Difficulty walking, Decreased endurance, Increased muscle spasms, Obesity, Decreased activity tolerance, Pain, Impaired flexibility, Improper body mechanics, Decreased strength, Postural dysfunction, Decreased range of motion, Decreased mobility, Decreased balance ? ?Visit Diagnosis: ?Muscle weakness (generalized) ? ?Cramp and spasm ? ?Cervicalgia ? ?Difficulty in walking, not elsewhere classified ? ?Lumbar radiculopathy ? ? ? ? ?Problem List ?Patient Active Problem List  ? Diagnosis Date Noted  ? Angina pectoris, unstable ([place]) 08/11/2021  ? Non-restorative sleep 08/11/2021  ? Unstable angina ([place]) 03/16/2021  ? 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  ? Fire ant bite 10/22/2016  ? 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/2016  ? Allergic urticaria 10/22/2016  ? 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 Obstructive sleep apnea treated with continuous positive airway pressure (CPAP) 10/22/2016  ? Chest pain 09/21/2015  ? Essential hypertension 09/21/2015  ? Diabetes ([place])  09/21/2015  ? Pain in the chest   ? ? ?Marcelina Morel, DPT ?03/01/2022, 12:07 PM ? ?[facility] ?[address] ?[address] ?[place], [place], [address] ?Phone: [phone]   Fax:  [phone] ? ?Name: Shannon Rosario ?MRN: [id number] ?Date of Birth: 09-07-1961 ? ? ? ?

## 2022-03-01 ENCOUNTER — Ambulatory Visit: Payer: Medicare Other | Admitting: Physical Therapy

## 2022-03-01 ENCOUNTER — Other Ambulatory Visit: Payer: Self-pay

## 2022-03-01 ENCOUNTER — Encounter: Payer: Self-pay | Admitting: Physical Therapy

## 2022-03-01 DIAGNOSIS — M542 Cervicalgia: Secondary | ICD-10-CM | POA: Diagnosis not present

## 2022-03-01 DIAGNOSIS — M6281 Muscle weakness (generalized): Secondary | ICD-10-CM | POA: Diagnosis not present

## 2022-03-01 DIAGNOSIS — R262 Difficulty in walking, not elsewhere classified: Secondary | ICD-10-CM

## 2022-03-01 DIAGNOSIS — R252 Cramp and spasm: Secondary | ICD-10-CM

## 2022-03-01 DIAGNOSIS — M5416 Radiculopathy, lumbar region: Secondary | ICD-10-CM | POA: Diagnosis not present

## 2022-03-01 NOTE — Therapy (Signed)
[facility] ?[address] ?[place], [place], [address] ?Phone: [phone]   Fax:  [phone] ? ?Physical Therapy Treatment ? ?Patient Details  ?Name: Shannon Rosario ?MRN: [id number] ?Date of Birth: 10-02-1961 ?Referring Provider (PT): Dr Edmonia Lynch, Laroy Apple ? ? ?Encounter Date: 03/01/2022 ? ? PT End of Session - 03/01/22 1623   ? ? Visit Number 7   ? Date for PT Re-Evaluation 04/04/22   ? PT Start Time 1533   ? PT Stop Time 2505   ? PT Time Calculation (min) 40 min   ? Activity Tolerance Patient tolerated treatment well   ? Behavior During Therapy [facility] for tasks assessed/performed   ? ?  ?  ? ?  ? ? ?Past Medical History:  ?Diagnosis Date  ? Anxiety   ? Arthritis   ? Depression   ? Diabetes mellitus without complication ([place])   ? Family history of adverse reaction to anesthesia   ? My Mother had some complications,I dont remember what   ? Fibromyalgia   ? GERD (gastroesophageal reflux disease)   ? Heart murmur   ? Hypercholesterolemia   ? Hypertension   ? Obesity   ? OSA (obstructive sleep apnea)   ? TIA (transient ischemic attack) 12/24/2010  ? ? ?Past Surgical History:  ?Procedure Laterality Date  ? APPENDECTOMY    ? [place]  ? [place]  ? CHOLECYSTECTOMY  1984  ? KNEE SURGERY Left 1995  ? LEFT HEART CATH AND CORONARY ANGIOGRAPHY N/A 03/16/2021  ? Procedure: LEFT HEART CATH AND CORONARY ANGIOGRAPHY;  Surgeon: Nigel Mormon, MD;  Location: [facility];  Service: Cardiovascular;  Laterality: N/A;  ? NASAL SINUS SURGERY  1992  ? TONSILLECTOMY  1968  ? ? ?There were no vitals filed for this visit. ? ? Subjective Assessment - 03/01/22 1624   ? ? Subjective I'm feeling OK today, I'm having some sharp stabbing pain on and off in my left shoulder but when those areas aren't acting up I feel OK   ? ?  ?  ? ?  ? ? ? ?Pain 0/10 today at rest  ? ? ? ? ? ? ? ? ? ? ? ? ? ? ? ? [facility] Adult PT Treatment/Exercise -  03/01/22 0001   ? ?  ? Neck Exercises: Machines for Strengthening  ? Cybex Row 35# 1x12   ? Lat Pull 35# 1x12   ? Other Machines for Strengthening B shoulder extensions 10# B 1x10; tricep curls 25# 1x10; cable rows 10# B 1x10 3 second holds   ?  ? Lumbar Exercises: Aerobic  ? UBE (Upper Arm Bike) UBE L2.5 3 min forward/3 min backward   ? ?  ?  ? ?  ? ? ? ?STM L middle delt and triceps groups  ? ? ? ? ? ? PT Education - 03/01/22 1622   ? ? Education Details may benefit from more DN to LUE given extent of trigger points and spasms   ? Person(s) Educated Patient   ? Methods Explanation   ? Comprehension Verbalized understanding   ? ?  ?  ? ?  ? ? ? PT Short Term Goals - 02/16/22 0821   ? ?  ? PT SHORT TERM GOAL #1  ? Title Pt will be independent with initial HEP.   ? Status Achieved   ? ?  ?  ? ?  ? ? ? ? PT  Long Term Goals - 02/22/22 1615   ? ?  ? PT LONG TERM GOAL #1  ? Title Pt will be independent with advanced HEP.   ? Time 8   ? Period Weeks   ? Status New   ? Target Date 04/04/22   ?  ? PT LONG TERM GOAL #2  ? Title 5x STS in < 12 sec to demonstrate imporved BLE strength and balance.   ? Time 8   ? Period Weeks   ? Status New   ? Target Date 04/04/22   ?  ? PT LONG TERM GOAL #3  ? Title Patient will report LBP and cervicalgia pain of </+3/10 during all of her regular daily activities and mobility.   ? Baseline 5-6   ? Time 6   ? Period Weeks   ? Status On-going   ? Target Date 04/04/22   ?  ? PT LONG TERM GOAL #4  ? Title Pt will demostrate increased in L hip and R shoulder strength to 4-/5 to 4/5 for improved proximal stability   ? Baseline (3+)-4-/5   ? Time 8   ? Period Weeks   ? Status New   ? Target Date 04/04/22   ?  ? PT LONG TERM GOAL #5  ? Title Pt will report ability to walk for 15-20 minutes w/o reliance on UE/UB support on grocery cart w/o increased LBP to improve tolerance for grocery shopping   ? Baseline 10 minutes iwth cart, mildly increaed pain.   ? Time 8   ? Period Weeks   ? Status New   ?  Target Date 04/04/22   ? ?  ?  ? ?  ? ? ? ? ? ? ? ? Plan - 03/01/22 1623   ? ? Clinical Impression Statement Shannon Rosario arrives today doing OK, having a lot of sharp pains in her L tricep as well as a lot of spasms and trigger points. We warmed up on the UBE, then spent a lot of time working on soft tissue restrictions in middle delt and triceps LUE. 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 Will continue to progress as able. May benefit from DN to L triceps next time.   ? Personal Factors and Comorbidities Fitness   ? Examination-Activity Limitations Locomotion Level;Sleep;Stairs   ? Examination-Participation Restrictions Community Activity   ? Stability/Clinical Decision Making Evolving/Moderate complexity   ? Clinical Decision Making Moderate   ? Rehab Potential Good   ? PT Frequency 2x / week   ? PT Duration 8 weeks   ? PT Treatment/Interventions ADLs/Self Care Home Management;Cryotherapy;Electrical Stimulation;Iontophoresis '4mg'$ /ml Dexamethasone;Moist Heat;Traction;Ultrasound;Gait training;Stair training;Functional mobility training;Therapeutic activities;Therapeutic exercise;Balance training;Neuromuscular re-education;Patient/family education;Manual techniques;Passive range of motion;Dry needling;Taping;Spinal Manipulations;Joint Manipulations   ? PT Next Visit Plan progress stab ex. consider DN as pt is very tight   ? PT Home Exercise Plan Access Code: [email or web address], from previous admission   ? Consulted and Agree with Plan of Care Patient   ? ?  ?  ? ?  ? ? ?Patient will benefit from skilled therapeutic intervention in order to improve the following deficits and impairments:  Difficulty walking, Decreased endurance, Increased muscle spasms, Obesity, Decreased activity tolerance, Pain, Impaired flexibility, Improper body mechanics, Decreased strength, Postural dysfunction, Decreased range of motion, Decreased mobility, Decreased balance ? ?Visit Diagnosis: ?Muscle weakness  (generalized) ? ?Cramp and spasm ? ?Cervicalgia ? ?Difficulty in walking, not elsewhere classified ? ?Lumbar radiculopathy ? ? ? ? ?Problem List ?  Patient Active Problem List  ? Diagnosis Date Noted  ? Angina pectoris, unstable ([place]) 08/11/2021  ? Non-restorative sleep 08/11/2021  ? Unstable angina ([place]) 03/16/2021  ? 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  ? Fire ant bite 10/22/2016  ? 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/2016  ? Allergic urticaria 10/22/2016  ? 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 Obstructive sleep apnea treated with continuous positive airway pressure (CPAP) 10/22/2016  ? Chest pain 09/21/2015  ? Essential hypertension 09/21/2015  ? Diabetes ([place]) 09/21/2015  ? Pain in the chest   ? ?Kerington Hildebrant U PT, DPT, PN2  ? ?Supplemental Physical Therapist ?[facility]  ? ? ? ? ? ? ?[facility] [place] ?[address] ?[place], [place], [address] ?Phone: [phone]   Fax:  [phone] ? ?Name: Shannon Rosario ?MRN: [id number] ?Date of Birth: [birth year] ? ? ? ?

## 2022-03-06 ENCOUNTER — Ambulatory Visit: Payer: Medicare Other | Admitting: Physical Therapy

## 2022-03-06 ENCOUNTER — Other Ambulatory Visit: Payer: Self-pay

## 2022-03-06 DIAGNOSIS — M5416 Radiculopathy, lumbar region: Secondary | ICD-10-CM | POA: Diagnosis not present

## 2022-03-06 DIAGNOSIS — M542 Cervicalgia: Secondary | ICD-10-CM | POA: Diagnosis not present

## 2022-03-06 DIAGNOSIS — R262 Difficulty in walking, not elsewhere classified: Secondary | ICD-10-CM | POA: Diagnosis not present

## 2022-03-06 DIAGNOSIS — M6281 Muscle weakness (generalized): Secondary | ICD-10-CM

## 2022-03-06 DIAGNOSIS — R252 Cramp and spasm: Secondary | ICD-10-CM

## 2022-03-06 NOTE — Therapy (Signed)
[facility] ?[place], [place], [address] ?Phone: [phone]   Fax:  [phone] ? ?Physical Therapy Treatment ? ?Patient Details  ?Name: Shannon Rosario ?MRN: [id number] ?Date of Birth: [birth year] ?Referring Provider (PT): Dr Edmonia Lynch, Laroy Apple ? ? ?Encounter Date: 03/06/2022 ? ? PT End of Session - 03/06/22 1442   ? ? Visit Number 8   ? Date for PT Re-Evaluation 04/04/22   ? PT Start Time 1400   ? PT Stop Time 1450   ? PT Time Calculation (min) 50 min   ? ?  ?  ? ?  ? ? ?Past Medical History:  ?Diagnosis Date  ? Anxiety   ? Arthritis   ? Depression   ? Diabetes mellitus without complication ([place])   ? Family history of adverse reaction to anesthesia   ? My Mother had some complications,I dont remember what   ? Fibromyalgia   ? GERD (gastroesophageal reflux disease)   ? Heart murmur   ? Hypercholesterolemia   ? Hypertension   ? Obesity   ? OSA (obstructive sleep apnea)   ? TIA (transient ischemic attack) 12/24/2010  ? ? ?Past Surgical History:  ?Procedure Laterality Date  ? APPENDECTOMY    ? [place]  ? [place]  ? CHOLECYSTECTOMY  1984  ? KNEE SURGERY Left 1995  ? LEFT HEART CATH AND CORONARY ANGIOGRAPHY N/A 03/16/2021  ? Procedure: LEFT HEART CATH AND CORONARY ANGIOGRAPHY;  Surgeon: Nigel Mormon, MD;  Location: [facility];  Service: Cardiovascular;  Laterality: N/A;  ? NASAL SINUS SURGERY  1992  ? TONSILLECTOMY  1968  ? ? ?There were no vitals filed for this visit. ? ? Subjective Assessment - 03/06/22 1359   ? ? Subjective feeling pretty good today- looser   ? Currently in Pain? No/denies   ? ?  ?  ? ?  ? ? ? ? ? ? ? ? ? ? ? ? ? ? ? ? ? ? ? ? [facility] Adult PT Treatment/Exercise - 03/06/22 0001   ? ?  ? Neck Exercises: Machines for Strengthening  ? UBE (Upper Arm Bike) L 3 3 min fwd 3 min back   ? Cybex Row 35# 2x12   ? Lat Pull 35# 2x12   ?  ? Neck Exercises: Theraband  ? Shoulder External  Rotation 20 reps;Red   ? Horizontal ABduction 20 reps;Red   ?  ? Neck Exercises: Standing  ? Wall Push Ups 10 reps   ball vs wall 5 x CW and CCW  ? Other Standing Exercises 10# pulleys shld ext 2 sets 10   ?  ? Neck Exercises: Seated  ? Other Seated Exercise 3# row,horz abd and shld ext 12 x   ?  ? Lumbar Exercises: Supine  ? Other Supine Lumbar Exercises wt ball core ex for total body with cuing   ?  ? Manual Therapy  ? Soft tissue mobilization B pect minor, Up traps, deltoid   ? ?  ?  ? ?  ? ? ? Trigger Point Dry Needling - 03/06/22 0001   ? ? Muscles Treated Upper Quadrant Deltoid   ? Upper Trapezius Response Twitch reponse elicited;Palpable increased muscle length   ? Deltoid Response Twitch response elicited;Palpable increased muscle length   ? ?  ?  ? ?  ? ? ? ? ? ? ? ? ? ? PT Short Term Goals - 02/16/22 0821   ? ?  ?  PT SHORT TERM GOAL #1  ? Title Pt will be independent with initial HEP.   ? Status Achieved   ? ?  ?  ? ?  ? ? ? ? PT Long Term Goals - 02/22/22 1615   ? ?  ? PT LONG TERM GOAL #1  ? Title Pt will be independent with advanced HEP.   ? Time 8   ? Period Weeks   ? Status New   ? Target Date 04/04/22   ?  ? PT LONG TERM GOAL #2  ? Title 5x STS in < 12 sec to demonstrate imporved BLE strength and balance.   ? Time 8   ? Period Weeks   ? Status New   ? Target Date 04/04/22   ?  ? PT LONG TERM GOAL #3  ? Title Patient will report LBP and cervicalgia pain of </+3/10 during all of her regular daily activities and mobility.   ? Baseline 5-6   ? Time 6   ? Period Weeks   ? Status On-going   ? Target Date 04/04/22   ?  ? PT LONG TERM GOAL #4  ? Title Pt will demostrate increased in L hip and R shoulder strength to 4-/5 to 4/5 for improved proximal stability   ? Baseline (3+)-4-/5   ? Time 8   ? Period Weeks   ? Status New   ? Target Date 04/04/22   ?  ? PT LONG TERM GOAL #5  ? Title Pt will report ability to walk for 15-20 minutes w/o reliance on UE/UB support on grocery cart w/o increased LBP to improve  tolerance for grocery shopping   ? Baseline 10 minutes iwth cart, mildly increaed pain.   ? Time 8   ? Period Weeks   ? Status New   ? Target Date 04/04/22   ? ?  ?  ? ?  ? ? ? ? ? ? ? ? Plan - 03/06/22 1442   ? ? Clinical Impression Statement pt arrived today doing better and mostly just c/o stiffness. pt tolerated ther ex well but did need postural cuing. after ex pt c/o burnig in RT shld and upper ar so added DN as discussed last session   ? PT Treatment/Interventions ADLs/Self Care Home Management;Cryotherapy;Electrical Stimulation;Iontophoresis '4mg'$ /ml Dexamethasone;Moist Heat;Traction;Ultrasound;Gait training;Stair training;Functional mobility training;Therapeutic activities;Therapeutic exercise;Balance training;Neuromuscular re-education;Patient/family education;Manual techniques;Passive range of motion;Dry needling;Taping;Spinal Manipulations;Joint Manipulations   ? PT Next Visit Plan assess and progress   ? ?  ?  ? ?  ? ? ?Patient will benefit from skilled therapeutic intervention in order to improve the following deficits and impairments:  Difficulty walking, Decreased endurance, Increased muscle spasms, Obesity, Decreased activity tolerance, Pain, Impaired flexibility, Improper body mechanics, Decreased strength, Postural dysfunction, Decreased range of motion, Decreased mobility, Decreased balance ? ?Visit Diagnosis: ?Muscle weakness (generalized) ? ?Cramp and spasm ? ? ? ? ?Problem List ?Patient Active Problem List  ? Diagnosis Date Noted  ? Angina pectoris, unstable ([place]) 08/11/2021  ? Non-restorative sleep 08/11/2021  ? Unstable angina ([place]) 03/16/2021  ? 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  ? Fire ant bite 10/22/2016  ? 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/2016  ? Allergic urticaria 10/22/2016  ? 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 Obstructive sleep apnea treated with continuous positive airway pressure (CPAP) 10/22/2016  ? Chest pain 09/21/2015   ? Essential hypertension 09/21/2015  ? Diabetes ([place]) 09/21/2015  ? Pain in the chest   ? ? ?  Sumner Boast, PT ?03/06/2022, 3:09 PM ? ? ?[facility] [address] ?[place], [place], [address] ?Phone: [phone]   Fax:  [phone] ? ?Name: Shannon Rosario ?MRN: [id number] ?Date of Birth: 05-Jul-1961 ? ? ? ?

## 2022-03-08 ENCOUNTER — Ambulatory Visit: Payer: Medicare Other | Admitting: Physical Therapy

## 2022-03-08 ENCOUNTER — Encounter: Payer: Self-pay | Admitting: Physical Therapy

## 2022-03-08 ENCOUNTER — Other Ambulatory Visit: Payer: Self-pay

## 2022-03-08 DIAGNOSIS — R252 Cramp and spasm: Secondary | ICD-10-CM | POA: Diagnosis not present

## 2022-03-08 DIAGNOSIS — M5416 Radiculopathy, lumbar region: Secondary | ICD-10-CM | POA: Diagnosis not present

## 2022-03-08 DIAGNOSIS — M542 Cervicalgia: Secondary | ICD-10-CM | POA: Diagnosis not present

## 2022-03-08 DIAGNOSIS — R262 Difficulty in walking, not elsewhere classified: Secondary | ICD-10-CM | POA: Diagnosis not present

## 2022-03-08 DIAGNOSIS — M6281 Muscle weakness (generalized): Secondary | ICD-10-CM | POA: Diagnosis not present

## 2022-03-08 NOTE — Therapy (Signed)
[facility] ?[facility] [address] ?[place], [place], [address] ?Phone: [phone]   Fax:  [phone] ? ?Physical Therapy Treatment ? ?Patient Details  ?Name: Shannon Rosario ?MRN: [id number] ?Date of Birth: 13-Mar-1961 ?Referring Provider (PT): Dr Edmonia Lynch, Laroy Apple ? ? ?Encounter Date: 03/08/2022 ? ? PT End of Session - 03/08/22 1602   ? ? Visit Number 9   ? Date for PT Re-Evaluation 04/04/22   ? PT Start Time 6962   ? PT Stop Time 1612   ? PT Time Calculation (min) 41 min   ? Activity Tolerance Patient tolerated treatment well   ? Behavior During Therapy [facility] for tasks assessed/performed   ? ?  ?  ? ?  ? ? ?Past Medical History:  ?Diagnosis Date  ? Anxiety   ? Arthritis   ? Depression   ? Diabetes mellitus without complication ([place])   ? Family history of adverse reaction to anesthesia   ? My Mother had some complications,I dont remember what   ? Fibromyalgia   ? GERD (gastroesophageal reflux disease)   ? Heart murmur   ? Hypercholesterolemia   ? Hypertension   ? Obesity   ? OSA (obstructive sleep apnea)   ? TIA (transient ischemic attack) 12/24/2010  ? ? ?Past Surgical History:  ?Procedure Laterality Date  ? APPENDECTOMY    ? [place]  ? [place]  ? CHOLECYSTECTOMY  1984  ? KNEE SURGERY Left 1995  ? LEFT HEART CATH AND CORONARY ANGIOGRAPHY N/A 03/16/2021  ? Procedure: LEFT HEART CATH AND CORONARY ANGIOGRAPHY;  Surgeon: Nigel Mormon, MD;  Location: [facility];  Service: Cardiovascular;  Laterality: N/A;  ? NASAL SINUS SURGERY  1992  ? TONSILLECTOMY  1968  ? ? ?There were no vitals filed for this visit. ? ? Subjective Assessment - 03/08/22 1533   ? ? Subjective Neck a shoulders feel tight. She feels the DN helped temporarily.   ? Pertinent History DDD   ? Currently in Pain? Yes   ? Pain Score 6    ? Pain Location Shoulder   ? Pain Orientation Right   ? Pain Descriptors / Indicators Aching   ? Pain Type Acute  pain   ? Pain Onset 1 to 4 weeks ago   ? Pain Frequency Intermittent   ? Pain Relieving Factors stretching, rest   ? ?  ?  ? ?  ? ? ? ? ? ? ? ? ? ? ? ? ? ? ? ? ? ? ? ? [facility] Adult PT Treatment/Exercise - 03/08/22 0001   ? ?  ? Neck Exercises: Machines for Strengthening  ? UBE (Upper Arm Bike) L 1.5, 3 min for/3 min back   ? Power Tower Shoulder ext, rows, ER, IR,with 10#, 2 x 10,   ?  ? Modalities  ? Modalities Iontophoresis   ?  ? Iontophoresis  ? Type of Iontophoresis Dexamethasone   ? Location R shoulder   ? Dose 1.2cc   ? Time 4 hr   ?  ? Manual Therapy  ? Manual Therapy Joint mobilization;Soft tissue mobilization;Scapular mobilization   ? Joint Mobilization [place] depression and posterior mobilizatoin   ? Soft tissue mobilization cross Friction massage to supraspinatus, Deep tissue massage and stretch to R pect minor.   ? Scapular Mobilization Abd/Add   ? Passive ROM Pect minor stretch   ? ?  ?  ? ?  ? ? ? ? ? ? ? ? ? ? ? ?  PT Short Term Goals - 02/16/22 0821   ? ?  ? PT SHORT TERM GOAL #1  ? Title Pt will be independent with initial HEP.   ? Status Achieved   ? ?  ?  ? ?  ? ? ? ? PT Long Term Goals - 03/08/22 1538   ? ?  ? PT LONG TERM GOAL #1  ? Title Pt will be independent with advanced HEP.   ? Time 6   ? Period Weeks   ? Status On-going   ? Target Date 04/04/22   ?  ? PT LONG TERM GOAL #2  ? Title 5x STS in < 12 sec to demonstrate imporved BLE strength and balance.   ? Time 6   ? Period Weeks   ? Status New   ? Target Date 04/04/22   ?  ? PT LONG TERM GOAL #3  ? Title Patient will report LBP and cervicalgia pain of </+3/10 during all of her regular daily activities and mobility.   ? Baseline up to 5-6   ? Time 6   ? Period Weeks   ? Status On-going   ? Target Date 04/04/22   ?  ? PT LONG TERM GOAL #4  ? Title Pt will demostrate increased in L hip and R shoulder strength to 4-/5 to 4/5 for improved proximal stability   ? Baseline (3+)-4-/5   ? Time 6   ? Period Weeks   ? Status New   ? Target Date 04/04/22   ?   ? PT LONG TERM GOAL #5  ? Title Pt will report ability to walk for 15-20 minutes w/o reliance on UE/UB support on grocery cart w/o increased LBP to improve tolerance for grocery shopping   ? Baseline Still relies on shopping cart.   ? Time 6   ? Period Weeks   ? Status New   ? Target Date 04/04/22   ? ?  ?  ? ?  ? ? ? ? ? ? ? ? Plan - 03/08/22 1604   ? ? Clinical Impression Statement Patient reporte R shoulder pain today. Perforemd man therapy with mobilizations for GH depression and posterior glide, cross friction and stretch to supraspinatus and pect minor and scapular mobilizations to decrease impingement under acromion. Patient then performed scapular stabilization exercises. Finished iwth Iontophoresis patch to R shoulder to minimize pain and inflamation from STM.   ? Stability/Clinical Decision Making Evolving/Moderate complexity   ? Clinical Decision Making Moderate   ? Rehab Potential Good   ? PT Frequency 2x / week   ? PT Duration Other (comment)   ? PT Treatment/Interventions ADLs/Self Care Home Management;Cryotherapy;Electrical Stimulation;Iontophoresis '4mg'$ /ml Dexamethasone;Moist Heat;Traction;Ultrasound;Gait training;Stair training;Functional mobility training;Therapeutic activities;Therapeutic exercise;Balance training;Neuromuscular re-education;Patient/family education;Manual techniques;Passive range of motion;Dry needling;Taping;Spinal Manipulations;Joint Manipulations   ? PT Next Visit Plan assess and progress   ? PT [place]   ? Consulted and Agree with Plan of Care Patient   ? ?  ?  ? ?  ? ? ?Patient will benefit from skilled therapeutic intervention in order to improve the following deficits and impairments:  Difficulty walking, Decreased endurance, Increased muscle spasms, Obesity, Decreased activity tolerance, Pain, Impaired flexibility, Improper body mechanics, Decreased strength, Postural dysfunction, Decreased range of motion, Decreased mobility, Decreased balance ? ?Visit  Diagnosis: ?Muscle weakness (generalized) ? ?Cramp and spasm ? ?Cervicalgia ? ?Difficulty in walking, not elsewhere classified ? ?Lumbar radiculopathy ? ? ? ? ?Problem List ?Patient Active Problem List  ?  Diagnosis Date Noted  ? Angina pectoris, unstable ([place]) 08/11/2021  ? Non-restorative sleep 08/11/2021  ? Unstable angina ([place]) 03/16/2021  ? 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  ? Fire ant bite 10/22/2016  ? 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/2016  ? Allergic urticaria 10/22/2016  ? 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 Obstructive sleep apnea treated with continuous positive airway pressure (CPAP) 10/22/2016  ? Chest pain 09/21/2015  ? Essential hypertension 09/21/2015  ? Diabetes ([place]) 09/21/2015  ? Pain in the chest   ? ? ?Marcelina Morel, DPT ?03/08/2022, 4:23 PM ? ?[facility] ?[address] ?[place], [place], [address] ?Phone: [phone]   Fax:  [phone] ? ?Name: Shannon Rosario ?MRN: [id number] ?Date of Birth: Oct 10, 1961 ? ? ? ?

## 2022-03-08 NOTE — Patient Instructions (Signed)
Access Code: [email or web address] ?URL: [email or web address] ?Date: 03/08/2022 ?Prepared by: Ethel Rana ? ?Exercises ?Shoulder Extension with Resistance - 1 x daily - 7 x weekly - 2 sets - 10 reps ?Standing Row with Anchored Resistance - 1 x daily - 7 x weekly - 2 sets - 10 reps ?Shoulder External Rotation and Scapular Retraction with Resistance - 1 x daily - 7 x weekly - 2 sets - 10 reps ? ?

## 2022-03-26 DIAGNOSIS — E1142 Type 2 diabetes mellitus with diabetic polyneuropathy: Secondary | ICD-10-CM | POA: Diagnosis not present

## 2022-03-26 DIAGNOSIS — Z1231 Encounter for screening mammogram for malignant neoplasm of breast: Secondary | ICD-10-CM | POA: Diagnosis not present

## 2022-03-26 DIAGNOSIS — M25512 Pain in left shoulder: Secondary | ICD-10-CM | POA: Diagnosis not present

## 2022-03-26 DIAGNOSIS — M47816 Spondylosis without myelopathy or radiculopathy, lumbar region: Secondary | ICD-10-CM | POA: Diagnosis not present

## 2022-03-26 DIAGNOSIS — M7062 Trochanteric bursitis, left hip: Secondary | ICD-10-CM | POA: Diagnosis not present

## 2022-03-26 DIAGNOSIS — E782 Mixed hyperlipidemia: Secondary | ICD-10-CM | POA: Diagnosis not present

## 2022-03-26 DIAGNOSIS — E1165 Type 2 diabetes mellitus with hyperglycemia: Secondary | ICD-10-CM | POA: Diagnosis not present

## 2022-03-26 DIAGNOSIS — I10 Essential (primary) hypertension: Secondary | ICD-10-CM | POA: Diagnosis not present

## 2022-04-02 ENCOUNTER — Ambulatory Visit: Payer: Medicare Other | Admitting: Student

## 2022-04-02 ENCOUNTER — Encounter: Payer: Self-pay | Admitting: Student

## 2022-04-02 VITALS — BP 120/79 | HR 99 | Temp 97.9°F | Resp 17 | Ht 68.0 in | Wt 266.8 lb

## 2022-04-02 DIAGNOSIS — I2510 Atherosclerotic heart disease of native coronary artery without angina pectoris: Secondary | ICD-10-CM | POA: Diagnosis not present

## 2022-04-02 DIAGNOSIS — I10 Essential (primary) hypertension: Secondary | ICD-10-CM

## 2022-04-02 NOTE — Progress Notes (Signed)
? ?ID:  Shannon Rosario, DOB 1961-03-08, MRN [id number] ? ?PCP:  Benito Mccreedy, MD  ?Cardiologist:  Rex Kras, DO, [facility] (established care 03/14/2021) ?Former Cardiology Providers: Dr. Hardie Lora ([place], [place]) ? ?Date: 10/05/21 ?Last Office Visit: 04/06/2021 ? ? ?Chief Complaint  ?Patient presents with  ? Follow-up  ? Coronary Artery Disease  ?  6 month  ? ? ?HPI  ?Shannon Rosario is a 61 y.o. female who presents to the office with a chief complaint of " 78-monthfollow-up for nonobstructive CAD management." Patient's past medical history and cardiovascular risk factors include: hypertension, Hx of TIA, Non-insulin-dependent diabetes mellitus type 2, hyperlipidemia, obesity due to excess calories, family history of premature CAD, postmenopausal female. ? ?She was initially referred to the office at the request of Osei-Bonsu, GIona Beard MD for evaluation of chest pain. ? ?During initial consultation her symptoms of chest pain were very suggestive of angina pectoris she was scheduled for outpatient work-up.  However presented to the ED for symptoms of unstable angina and underwent left heart catheterization and noted nonobstructive CAD.  The 80% eccentric stenosis in the mid second obtuse marginal branch was recommended to be treated medically as the lumen caliber was noted to be small in size. ? ?Since then her antianginal therapy has been uptitrated and she is remained symptom-free.   ? ?Patient presents for 649-monthollow-up.  At last office visit due to concerns of fatigue discontinued losartan/hydrochlorothiazide and switch patient to plain losartan 25 mg p.o. daily.  Patient remains chest pain-free.  Her primary concern today is questions regarding weight loss as well as difficulty sleeping.  Blood pressure is well controlled.  She is tolerating aspirin, statin, beta-blocker, and ARB therapy. ? ?Since last visit she has been evaluated by sleep medicine and it was informed that she did not  have sleep apnea. ? ? ?FUNCTIONAL STATUS: ?No structured exercise program or daily routine.  ? ?ALLERGIES: ?Allergies  ?Allergen Reactions  ? Bee Venom Anaphylaxis  ? Pineapple Swelling  ?  Reports it makes her throat swell  ? Dobutamine Hypertension  ? ? ?MEDICATION LIST PRIOR TO VISIT: ?Current Meds  ?Medication Sig  ? amLODipine (NORVASC) 10 MG tablet Take 1 tablet by mouth daily.  ? aspirin EC 81 MG tablet Take 1 tablet (81 mg total) by mouth daily. Swallow whole.  ? celecoxib (CELEBREX) 100 MG capsule Take 100 mg by mouth 2 (two) times daily.  ? cholecalciferol (VITAMIN D) 1000 UNITS tablet Take 1,000 Units by mouth daily.  ? cycloSPORINE (RESTASIS) 0.05 % ophthalmic emulsion 2 (two) times daily.  ? DULoxetine (CYMBALTA) 60 MG capsule Take 60 mg by mouth at bedtime.  ? EPINEPHrine 0.3 mg/0.3 mL IJ SOAJ injection Inject 0.3 mLs (0.3 mg total) into the muscle once as needed (anaphylaxis).  ? gabapentin (NEURONTIN) 300 MG capsule Take 300 mg by mouth 2 (two) times daily as needed.  ? glimepiride (AMARYL) 4 MG tablet Take 4 mg by mouth in the morning and at bedtime.  ? levocetirizine (XYZAL) 5 MG tablet Take 5 mg by mouth daily.  ? losartan (COZAAR) 25 MG tablet TAKE 1 TABLET BY MOUTH EVERY EVENING  ? metFORMIN (GLUCOPHAGE) 500 MG tablet Take 2 tablets by mouth 2 (two) times daily.  ? metoprolol succinate (TOPROL XL) 25 MG 24 hr tablet Take 1 tablet (25 mg total) by mouth daily.  ? nitroGLYCERIN (NITROSTAT) 0.4 MG SL tablet TAKE 1 TAB SUBLINGUALLY EVERY 5 MINUTES AS NEEDED  ? omeprazole (PRILOSEC) 20 MG capsule  Take 20 mg by mouth daily.  ? rosuvastatin (CRESTOR) 10 MG tablet Take 10 mg by mouth daily.  ? TRULICITY 1.5 JM/4.2AS SOPN Inject 1.5 mg into the skin once a week.  ? VALIUM 2 MG tablet Take 1 tablet by mouth as needed.  ? vitamin E 1000 UNIT capsule Take 1,000 Units by mouth daily.  ?  ? ?PAST MEDICAL HISTORY: ?Past Medical History:  ?Diagnosis Date  ? Anxiety   ? Arthritis   ? Depression   ? Diabetes  mellitus without complication ([place])   ? Family history of adverse reaction to anesthesia   ? My Mother had some complications,I dont remember what   ? Fibromyalgia   ? GERD (gastroesophageal reflux disease)   ? Heart murmur   ? Hypercholesterolemia   ? Hypertension   ? Obesity   ? OSA (obstructive sleep apnea)   ? TIA (transient ischemic attack) 12/24/2010  ? ? ?PAST SURGICAL HISTORY: ?Past Surgical History:  ?Procedure Laterality Date  ? APPENDECTOMY    ? [place]  ? [place]  ? CHOLECYSTECTOMY  1984  ? KNEE SURGERY Left 1995  ? LEFT HEART CATH AND CORONARY ANGIOGRAPHY N/A 03/16/2021  ? Procedure: LEFT HEART CATH AND CORONARY ANGIOGRAPHY;  Surgeon: Nigel Mormon, MD;  Location: [facility];  Service: Cardiovascular;  Laterality: N/A;  ? NASAL SINUS SURGERY  1992  ? TONSILLECTOMY  1968  ? ? ?FAMILY HISTORY: ?The patient family history includes Allergic rhinitis in her daughter, father, grandchild, grandchild, and mother; Asthma in her mother; Bronchitis in her daughter, grandchild, grandchild, and sister; CAD in her mother; Dementia in her father and mother; Diabetes in her mother; Eczema in her grandchild and sister; Food Allergy in her daughter; Hypertension in her mother; Lupus in her mother; Other in her mother; Sleep apnea in her cousin and sister; Stroke in her mother; Urticaria in her grandchild. ? ?SOCIAL HISTORY:  ?The patient  reports that she has never smoked. She has been exposed to tobacco smoke. She has never used smokeless tobacco. She reports that she does not currently use alcohol. She reports that she does not use drugs. ? ?REVIEW OF SYSTEMS:  ?Review of Systems  ?Constitutional: Positive for malaise/fatigue (stable). Negative for chills and fever.  ?HENT:  Negative for hoarse voice and nosebleeds.   ?Eyes:  Negative for discharge, double vision and pain.  ?Cardiovascular:  Negative for chest pain, claudication, dyspnea on exertion, leg swelling,  near-syncope, orthopnea, palpitations, paroxysmal nocturnal dyspnea and syncope.  ?Respiratory:  Negative for hemoptysis and shortness of breath.   ?Musculoskeletal:  Negative for muscle cramps and myalgias.  ?Gastrointestinal:  Negative for abdominal pain, constipation, diarrhea, hematemesis, hematochezia, melena, nausea and vomiting.  ?Neurological:  Negative for dizziness and light-headedness.  ? ?PHYSICAL EXAM: ? ?  04/02/2022  ?  3:07 PM 10/05/2021  ? 10:50 AM 06/01/2021  ? 10:10 AM  ?Vitals with BMI  ?Height '5\' 8"'$  '5\' 8"'$  '5\' 8"'$   ?Weight 266 lbs 13 oz 263 lbs 264 lbs  ?BMI 40.58 40 40.15  ?Systolic 341 962 229  ?Diastolic 79 90 88  ?Pulse 99 109 95  ? ?CONSTITUTIONAL: Well-developed and well-nourished. No acute distress. Obese ?SKIN: Skin is warm and dry. No rash noted. No cyanosis. No pallor. No jaundice ?HEAD: Normocephalic and atraumatic.  ?EYES: No scleral icterus ?MOUTH/THROAT: Moist oral membranes.  ?NECK: No JVD present. No thyromegaly noted. No carotid bruits  ?LYMPHATIC: No visible cervical adenopathy.  ?  CHEST Normal respiratory effort. No intercostal retractions  ?LUNGS: Clear to auscultation bilaterally.  No stridor. No wheezes. No rales.  ?CARDIOVASCULAR: Regular rate and rhythm, positive S1-S2, no murmurs rubs or gallops appreciated. ?ABDOMINAL: No apparent ascites.  ?EXTREMITIES: No peripheral edema  ?HEMATOLOGIC: No significant bruising ?NEUROLOGIC: Oriented to person, place, and time. Nonfocal. Normal muscle tone.  ?PSYCHIATRIC: Normal mood and affect. Normal behavior. Cooperative ? ?CARDIAC DATABASE: ?EKG: ?04/02/2022: Normal sinus rhythm at a rate of 85 bpm.  Old inferior infarct.  Poor R wave progression, cannot exclude anteroseptal infarct old.  Low voltage complexes.  Compared EKG 10/05/2021, no significant change. ?10/05/2021: Normal sinus rhythm, 98 bpm, low voltage in the precordial leads, old inferior infarct, poor R wave progression, without underlying injury pattern.    ? ?Echocardiogram: ?09/21/2015:  ?LVEF 60-65%, no regional wall motion abnormalities, grade 1 diastolic impairment, mildly dilated left atrium. ? ?Stress Testing: ?No results found for this or any previous visit from the past [place]

## 2022-04-04 ENCOUNTER — Ambulatory Visit: Payer: Medicare Other | Admitting: Cardiology

## 2022-05-28 DIAGNOSIS — M8588 Other specified disorders of bone density and structure, other site: Secondary | ICD-10-CM | POA: Diagnosis not present

## 2022-05-28 DIAGNOSIS — E782 Mixed hyperlipidemia: Secondary | ICD-10-CM | POA: Diagnosis not present

## 2022-05-28 DIAGNOSIS — H02889 Meibomian gland dysfunction of unspecified eye, unspecified eyelid: Secondary | ICD-10-CM | POA: Diagnosis not present

## 2022-05-28 DIAGNOSIS — I10 Essential (primary) hypertension: Secondary | ICD-10-CM | POA: Diagnosis not present

## 2022-05-28 DIAGNOSIS — H04123 Dry eye syndrome of bilateral lacrimal glands: Secondary | ICD-10-CM | POA: Diagnosis not present

## 2022-05-28 DIAGNOSIS — Z780 Asymptomatic menopausal state: Secondary | ICD-10-CM | POA: Diagnosis not present

## 2022-05-28 DIAGNOSIS — E1165 Type 2 diabetes mellitus with hyperglycemia: Secondary | ICD-10-CM | POA: Diagnosis not present

## 2022-05-28 DIAGNOSIS — E1142 Type 2 diabetes mellitus with diabetic polyneuropathy: Secondary | ICD-10-CM | POA: Diagnosis not present

## 2022-06-21 DIAGNOSIS — R1013 Epigastric pain: Secondary | ICD-10-CM | POA: Diagnosis not present

## 2022-06-21 DIAGNOSIS — E1165 Type 2 diabetes mellitus with hyperglycemia: Secondary | ICD-10-CM | POA: Diagnosis not present

## 2022-08-13 DIAGNOSIS — E1142 Type 2 diabetes mellitus with diabetic polyneuropathy: Secondary | ICD-10-CM | POA: Diagnosis not present

## 2022-08-13 DIAGNOSIS — Z0001 Encounter for general adult medical examination with abnormal findings: Secondary | ICD-10-CM | POA: Diagnosis not present

## 2022-08-13 DIAGNOSIS — E1165 Type 2 diabetes mellitus with hyperglycemia: Secondary | ICD-10-CM | POA: Diagnosis not present

## 2022-08-13 DIAGNOSIS — E782 Mixed hyperlipidemia: Secondary | ICD-10-CM | POA: Diagnosis not present

## 2022-08-13 DIAGNOSIS — I10 Essential (primary) hypertension: Secondary | ICD-10-CM | POA: Diagnosis not present

## 2022-08-16 ENCOUNTER — Encounter: Payer: Self-pay | Admitting: Cardiology

## 2022-08-16 ENCOUNTER — Ambulatory Visit: Payer: Medicare Other | Admitting: Cardiology

## 2022-08-16 VITALS — BP 132/81 | HR 101 | Temp 96.5°F | Resp 16 | Ht 68.0 in | Wt 261.0 lb

## 2022-08-16 DIAGNOSIS — E782 Mixed hyperlipidemia: Secondary | ICD-10-CM

## 2022-08-16 DIAGNOSIS — M7989 Other specified soft tissue disorders: Secondary | ICD-10-CM | POA: Diagnosis not present

## 2022-08-16 DIAGNOSIS — Z8673 Personal history of transient ischemic attack (TIA), and cerebral infarction without residual deficits: Secondary | ICD-10-CM | POA: Diagnosis not present

## 2022-08-16 DIAGNOSIS — E1165 Type 2 diabetes mellitus with hyperglycemia: Secondary | ICD-10-CM

## 2022-08-16 DIAGNOSIS — I10 Essential (primary) hypertension: Secondary | ICD-10-CM

## 2022-08-16 DIAGNOSIS — I2510 Atherosclerotic heart disease of native coronary artery without angina pectoris: Secondary | ICD-10-CM | POA: Diagnosis not present

## 2022-08-16 DIAGNOSIS — R0683 Snoring: Secondary | ICD-10-CM

## 2022-08-16 MED ORDER — HYDROCHLOROTHIAZIDE 25 MG PO TABS: 25.0000 mg | ORAL_TABLET | Freq: Every morning | ORAL | 0 refills | Status: DC

## 2022-08-16 NOTE — Progress Notes (Signed)
ID:  Shannon Rosario, DOB 03-31-1961, MRN [id number]  PCP:  Benito Mccreedy, MD  Cardiologist:  Rex Kras, DO, [facility] (established care 03/14/2021) Former Cardiology Providers: Dr. Hardie Lora ([place], [place])  Date: 08/16/22 Last Office Visit: 10/05/2021  Chief Complaint  Patient presents with   Coronary Artery Disease   Follow-up   HPI  Shannon Rosario is a 61 y.o. female whose past medical history and cardiovascular risk factors include: hypertension, Hx of TIA, Non-insulin-dependent diabetes mellitus type 2, hyperlipidemia, obesity due to excess calories, family history of premature CAD, postmenopausal female.  She was referred to the practice for evaluation of chest pain and CAD.  She underwent left heart catheterization and was noted to have nonobstructive CAD.  The eccentric stenosis in the mid second obtuse marginal branch was recommended to be treated medically as the lumen was small in size.  She has remained asymptomatic from anginal discomfort since last office visit.  She presents today sooner than her scheduled visit due to lower extremity swelling.  The right lower extremity is worse than the left, has become more noticeable since April 2023, the swelling extends up to the midshin bilaterally.   No change in medical history since last office visit.  She flew to [place] in April and the flight was 1.5 hours in duration and the same when she returned back on August 05, 2022.   She has noticed that the lower extremity swelling is better in the morning and when she elevates her legs or she is off her feet.  More noticeable towards the end of the day.  No associated orthopnea or PND.  Patient states that she feels that she is more tired and fatigued, takes frequent naps, appears to be not well rested.  ALLERGIES: Allergies  Allergen Reactions   Bee Venom Anaphylaxis   Pineapple Swelling    Reports it makes her throat swell   Dobutamine Hypertension     MEDICATION LIST PRIOR TO VISIT: Current Meds  Medication Sig   aspirin EC 81 MG tablet Take 1 tablet (81 mg total) by mouth daily. Swallow whole.   cholecalciferol (VITAMIN D) 1000 UNITS tablet Take 1,000 Units by mouth daily.   cycloSPORINE (RESTASIS) 0.05 % ophthalmic emulsion 2 (two) times daily.   EPINEPHrine 0.3 mg/0.3 mL IJ SOAJ injection Inject 0.3 mLs (0.3 mg total) into the muscle once as needed (anaphylaxis).   glimepiride (AMARYL) 4 MG tablet Take 4 mg by mouth in the morning and at bedtime.   hydrochlorothiazide (HYDRODIURIL) 25 MG tablet Take 1 tablet (25 mg total) by mouth in the morning.   levocetirizine (XYZAL) 5 MG tablet Take 5 mg by mouth daily.   losartan (COZAAR) 25 MG tablet TAKE 1 TABLET BY MOUTH EVERY EVENING   loteprednol (LOTEMAX) 0.5 % ophthalmic suspension SMARTSIG:In Eye(s)   metFORMIN (GLUCOPHAGE) 500 MG tablet Take 2 tablets by mouth 2 (two) times daily.   metoprolol succinate (TOPROL XL) 25 MG 24 hr tablet Take 1 tablet (25 mg total) by mouth daily.   nitroGLYCERIN (NITROSTAT) 0.4 MG SL tablet TAKE 1 TAB SUBLINGUALLY EVERY 5 MINUTES AS NEEDED   omeprazole (PRILOSEC) 20 MG capsule Take 20 mg by mouth daily.   rosuvastatin (CRESTOR) 10 MG tablet Take 10 mg by mouth daily.   vitamin E 1000 UNIT capsule Take 1,000 Units by mouth daily.   [DISCONTINUED] amLODipine (NORVASC) 10 MG tablet Take 1 tablet by mouth daily.   [DISCONTINUED] TRULICITY 1.5 AX/0.9MM SOPN Inject 1.5 mg into the  skin once a week.     PAST MEDICAL HISTORY: Past Medical History:  Diagnosis Date   Anxiety    Arthritis    Depression    Diabetes mellitus without complication (HCC)    Family history of adverse reaction to anesthesia    My Mother had some complications,I dont remember what    Fibromyalgia    GERD (gastroesophageal reflux disease)    Heart murmur    Hypercholesterolemia    Hypertension    Obesity    OSA (obstructive sleep apnea)    TIA (transient ischemic attack)  12/24/2010    PAST SURGICAL HISTORY: Past Surgical History:  Procedure Laterality Date   APPENDECTOMY     CARPAL TUNNEL RELEASE  [place] Left 1995   LEFT HEART CATH AND CORONARY ANGIOGRAPHY N/A 03/16/2021   Procedure: LEFT HEART CATH AND CORONARY ANGIOGRAPHY;  Surgeon: Nigel Mormon, MD;  Location: [facility];  Service: Cardiovascular;  Laterality: N/A;   NASAL SINUS SURGERY  1992   TONSILLECTOMY  1968    FAMILY HISTORY: The patient family history includes Allergic rhinitis in her daughter, father, grandchild, grandchild, and mother; Asthma in her mother; Bronchitis in her daughter, grandchild, grandchild, and sister; CAD in her mother; Dementia in her father and mother; Diabetes in her mother; Eczema in her grandchild and sister; Food Allergy in her daughter; Hypertension in her mother; Lupus in her mother; Other in her mother; Sleep apnea in her cousin and sister; Stroke in her mother; Urticaria in her grandchild.  SOCIAL HISTORY:  The patient  reports that she has never smoked. She has been exposed to tobacco smoke. She has never used smokeless tobacco. She reports that she does not currently use alcohol. She reports that she does not use drugs.  REVIEW OF SYSTEMS: Review of Systems  Constitutional: Positive for malaise/fatigue.  Cardiovascular:  Positive for leg swelling. Negative for chest pain, claudication, dyspnea on exertion, irregular heartbeat, near-syncope, orthopnea, palpitations, paroxysmal nocturnal dyspnea and syncope.  Respiratory:  Negative for shortness of breath.   Hematologic/Lymphatic: Negative for bleeding problem.  Musculoskeletal:  Negative for muscle cramps and myalgias.  Neurological:  Negative for dizziness and light-headedness.   PHYSICAL EXAM:    08/16/2022    1:53 PM 08/16/2022    1:51 PM 08/16/2022    1:42 PM  Vitals with BMI  Height   '5\' 8"'$   Weight   261 lbs  BMI   20.25   Systolic 427 062 376  Diastolic 91 92 95  Pulse  101 99   Physical Exam  Constitutional: No distress.  Age appropriate, hemodynamically stable.   Neck: No JVD present.  Cardiovascular: Normal rate, regular rhythm, S1 normal, S2 normal, intact distal pulses and normal pulses. Exam reveals no gallop, no S3 and no S4.  No murmur heard. Pulmonary/Chest: Effort normal and breath sounds normal. No stridor. She has no wheezes. She has no rales.  Abdominal: Soft. Bowel sounds are normal. She exhibits no distension. There is no abdominal tenderness.  Musculoskeletal:        General: No edema.     Cervical back: Neck supple.  Neurological: She is alert and oriented to person, place, and time. She has intact cranial nerves (2-12).  Skin: Skin is warm and moist.   CARDIAC DATABASE: EKG: 10/05/2021: Normal sinus rhythm, 98 bpm, low voltage in the precordial leads, old inferior infarct, poor R wave progression,  without underlying injury pattern.    Echocardiogram: 09/21/2015:  LVEF 60-65%, no regional wall motion abnormalities, grade 1 diastolic impairment, mildly dilated left atrium.  Stress Testing: No results found for this or any previous visit from the past 1095 days.  Heart Catheterization: 03/16/2021:  LM: Normal LAD: Prox 20%, mid 30% stenoses LCx: Small caliber OM2 with mid eccentric 80% stenosis RCA: Mid 20% disease LVEDP 11 mmHg   LABORATORY DATA:    Latest Ref Rng & Units 03/16/2021    4:51 AM 03/15/2021    8:28 PM 09/30/2016    2:15 PM  CBC  WBC 4.0 - 10.5 K/uL 9.9  8.8  8.1   Hemoglobin 12.0 - 15.0 g/dL 12.0  12.8  12.3   Hematocrit 36.0 - 46.0 % 37.4  38.4  37.4   Platelets 150 - 400 K/uL 287  328  243        Latest Ref Rng & Units 03/16/2021    4:51 AM 03/15/2021    8:28 PM 09/30/2016    2:15 PM  CMP  Glucose 70 - 99 mg/dL 120  122  278   BUN 6 - 20 mg/dL _0 Creatinine 0.44 - 1.00 mg/dL 0.91  0.85  0.83   Sodium 135 - 145 mmol/L 140  140  139    Potassium 3.5 - 5.1 mmol/L 3.7  3.7  3.3   Chloride 98 - 111 mmol/L 106  108  106   CO2 22 - 32 mmol/L _1 Calcium 8.9 - 10.3 mg/dL 8.8  8.9  8.8   Total Protein 6.5 - 8.1 g/dL   6.5   Total Bilirubin 0.3 - 1.2 mg/dL   0.4   Alkaline Phos 38 - 126 U/L   57   AST 15 - 41 U/L   19   ALT 14 - 54 U/L   30     Lipid Panel     Component Value Date/Time   CHOL 121 03/16/2021 0854   TRIG 181 (H) 03/16/2021 0854   HDL 37 (L) 03/16/2021 0854   CHOLHDL 3.3 03/16/2021 0854   VLDL 36 03/16/2021 0854   LDLCALC 48 03/16/2021 0854    No components found for: "NTPROBNP" No results for input(s): "PROBNP" in the last 8760 hours. No results for input(s): "TSH" in the last 8760 hours.   BMP No results for input(s): "NA", "K", "CL", "CO2", "GLUCOSE", "BUN", "CREATININE", "CALCIUM", "GFRNONAA", "GFRAA" in the last 8760 hours.   HEMOGLOBIN A1C Lab Results  Component Value Date   HGBA1C 7.3 (H) 03/16/2021   MPG 162.81 03/16/2021   External Labs: Collected: 02/27/2021 Creatinine 0.89 mg/dL. eGFR: 82 mL/min per 1.73 m Potassium 4.2. AST 20, ALT 26, alkaline phosphatase 72 Hemoglobin 13.5 g/dL, hematocrit 42% Troponin I 13 Hemoglobin A1c: 7.1  Lipid profile: Collected: 06/02/2019 Total cholesterol 145, triglycerides 328, HDL 34, LDL 45  IMPRESSION:    ICD-10-CM   1. Leg swelling  M79.89 PCV LOWER VENOUS US (BILATERAL)    Basic metabolic panel    Magnesium    2. Snoring  R06.83 Ambulatory referral to Sleep Studies    3. Nonobstructive atherosclerosis of coronary artery  I25.10 PCV ECHOCARDIOGRAM COMPLETE    4. Essential hypertension  I10 hydrochlorothiazide (HYDRODIURIL) 25 MG tablet    5. Type 2 diabetes mellitus with hyperglycemia, without long-term current use of insulin (HCC)  E11.65     6. Mixed hyperlipidemia  E78.2  7. Hx-TIA (transient ischemic attack)  Z86.73     8. Class 2 severe obesity due to excess calories with serious comorbidity and body mass index  (BMI) of 39.0 to 39.9 in adult [facility])  E66.01    Z68.39        RECOMMENDATIONS: Shannon Rosario is a 61 y.o. female whose past medical history and cardiac risk factors include: Nonobstructive CAD, hypertension, Hx of TIA, Non-insulin-dependent diabetes mellitus type 2, hyperlipidemia, obesity due to excess calories, family history of premature CAD, postmenopausal female.  Leg swelling Present since April 2023.  Currently euvolemic.  Discontinue Norvasc  Start HCTZ $RemoveBe'25mg'PbVpTxNrs$  po qAM and labs in one week.  Check LE Venous Duplex to rule out DVT.  Will check echocardiogram to re-evaluate the LVEF given the change in clinical status.   Snoring Given her snoring, frequent naps, feeling tired and fatigue.  Will refer to sleep medicine for evaluation of sleep apnea.   Nonobstructive atherosclerosis of coronary artery Currently asymptomatic.  No use of sublingual nitroglycerin tabs.  Medications reconciled.  Echo as noted above.  Educated on the importance of secondary prevention and improving her modifiable cardiovascular risk factors.   Essential hypertension Blood pressures are within acceptable range.  Monitor for now.  Type 2 diabetes mellitus with hyperglycemia, without long-term current use of insulin (HCC) We emphasized the importance of glycemic control. Currently on ARB, statin therapy, aspirin  Mixed hyperlipidemia Currently on rosuvastatin.   She denies myalgia or other side effects. Currently managed by primary care provider.  FINAL MEDICATION LIST END OF ENCOUNTER: Meds ordered this encounter  Medications   hydrochlorothiazide (HYDRODIURIL) 25 MG tablet    Sig: Take 1 tablet (25 mg total) by mouth in the morning.    Dispense:  90 tablet    Refill:  0   Medications Discontinued During This Encounter  Medication Reason   DULoxetine (CYMBALTA) 60 MG capsule    gabapentin (NEURONTIN) 300 MG capsule    TRULICITY 1.5 LS/9.3TD SOPN    VALIUM 2 MG tablet     amLODipine (NORVASC) 10 MG tablet Discontinued by provider      Current Outpatient Medications:    aspirin EC 81 MG tablet, Take 1 tablet (81 mg total) by mouth daily. Swallow whole., Disp: 30 tablet, Rfl: 11   cholecalciferol (VITAMIN D) 1000 UNITS tablet, Take 1,000 Units by mouth daily., Disp: , Rfl:    cycloSPORINE (RESTASIS) 0.05 % ophthalmic emulsion, 2 (two) times daily., Disp: , Rfl:    EPINEPHrine 0.3 mg/0.3 mL IJ SOAJ injection, Inject 0.3 mLs (0.3 mg total) into the muscle once as needed (anaphylaxis)., Disp: 1 Device, Rfl: 0   glimepiride (AMARYL) 4 MG tablet, Take 4 mg by mouth in the morning and at bedtime., Disp: , Rfl:    hydrochlorothiazide (HYDRODIURIL) 25 MG tablet, Take 1 tablet (25 mg total) by mouth in the morning., Disp: 90 tablet, Rfl: 0   levocetirizine (XYZAL) 5 MG tablet, Take 5 mg by mouth daily., Disp: , Rfl:    losartan (COZAAR) 25 MG tablet, TAKE 1 TABLET BY MOUTH EVERY EVENING, Disp: 90 tablet, Rfl: 1   loteprednol (LOTEMAX) 0.5 % ophthalmic suspension, SMARTSIG:In Eye(s), Disp: , Rfl:    metFORMIN (GLUCOPHAGE) 500 MG tablet, Take 2 tablets by mouth 2 (two) times daily., Disp: , Rfl:    metoprolol succinate (TOPROL XL) 25 MG 24 hr tablet, Take 1 tablet (25 mg total) by mouth daily., Disp: 90 tablet, Rfl: 0   nitroGLYCERIN (NITROSTAT) 0.4 MG SL  tablet, TAKE 1 TAB SUBLINGUALLY EVERY 5 MINUTES AS NEEDED, Disp: , Rfl:    omeprazole (PRILOSEC) 20 MG capsule, Take 20 mg by mouth daily., Disp: , Rfl:    rosuvastatin (CRESTOR) 10 MG tablet, Take 10 mg by mouth daily., Disp: , Rfl:    vitamin E 1000 UNIT capsule, Take 1,000 Units by mouth daily., Disp: , Rfl:    celecoxib (CELEBREX) 100 MG capsule, Take 100 mg by mouth 2 (two) times daily. (Patient not taking: Reported on 08/16/2022), Disp: , Rfl:   Orders Placed This Encounter  Procedures   Basic metabolic panel   Magnesium   Ambulatory referral to Sleep Studies   PCV ECHOCARDIOGRAM COMPLETE   PCV LOWER VENOUS US  (BILATERAL)    There are no Patient Instructions on file for this visit.   --Continue cardiac medications as reconciled in final medication list. --Return in about 6 weeks (around 09/27/2022) for Follow up. Or sooner if needed. --Continue follow-up with your primary care physician regarding the management of your other chronic comorbid conditions.  Patient's questions and concerns were addressed to her satisfaction. She voices understanding of the instructions provided during this encounter.   This note was created using a voice recognition software as a result there may be grammatical errors inadvertently enclosed that do not reflect the nature of this encounter. Every attempt is made to correct such errors.  Rex Kras, [place], [facility]  Pager: [phone] Office: [phone]

## 2022-09-03 DIAGNOSIS — I10 Essential (primary) hypertension: Secondary | ICD-10-CM | POA: Diagnosis not present

## 2022-09-03 DIAGNOSIS — E782 Mixed hyperlipidemia: Secondary | ICD-10-CM | POA: Diagnosis not present

## 2022-09-03 DIAGNOSIS — E1142 Type 2 diabetes mellitus with diabetic polyneuropathy: Secondary | ICD-10-CM | POA: Diagnosis not present

## 2022-09-03 DIAGNOSIS — E1165 Type 2 diabetes mellitus with hyperglycemia: Secondary | ICD-10-CM | POA: Diagnosis not present

## 2022-09-13 ENCOUNTER — Ambulatory Visit: Payer: Medicare Other

## 2022-09-13 DIAGNOSIS — H2513 Age-related nuclear cataract, bilateral: Secondary | ICD-10-CM | POA: Diagnosis not present

## 2022-09-13 DIAGNOSIS — E119 Type 2 diabetes mellitus without complications: Secondary | ICD-10-CM | POA: Diagnosis not present

## 2022-09-13 DIAGNOSIS — I2510 Atherosclerotic heart disease of native coronary artery without angina pectoris: Secondary | ICD-10-CM

## 2022-09-13 DIAGNOSIS — M7989 Other specified soft tissue disorders: Secondary | ICD-10-CM | POA: Diagnosis not present

## 2022-09-13 DIAGNOSIS — H04123 Dry eye syndrome of bilateral lacrimal glands: Secondary | ICD-10-CM | POA: Diagnosis not present

## 2022-09-17 DIAGNOSIS — I10 Essential (primary) hypertension: Secondary | ICD-10-CM | POA: Diagnosis not present

## 2022-09-17 DIAGNOSIS — G4733 Obstructive sleep apnea (adult) (pediatric): Secondary | ICD-10-CM | POA: Diagnosis not present

## 2022-09-28 ENCOUNTER — Ambulatory Visit: Payer: Medicare Other | Admitting: Cardiology

## 2022-09-28 ENCOUNTER — Encounter: Payer: Self-pay | Admitting: Cardiology

## 2022-09-28 VITALS — BP 143/98 | HR 76 | Temp 98.0°F | Resp 16 | Ht 68.0 in | Wt 261.0 lb

## 2022-09-28 DIAGNOSIS — E1165 Type 2 diabetes mellitus with hyperglycemia: Secondary | ICD-10-CM

## 2022-09-28 DIAGNOSIS — E782 Mixed hyperlipidemia: Secondary | ICD-10-CM

## 2022-09-28 DIAGNOSIS — I2510 Atherosclerotic heart disease of native coronary artery without angina pectoris: Secondary | ICD-10-CM

## 2022-09-28 DIAGNOSIS — I10 Essential (primary) hypertension: Secondary | ICD-10-CM | POA: Diagnosis not present

## 2022-09-28 NOTE — Progress Notes (Signed)
ID:  Shannon Rosario, DOB August 21, 1961, MRN [id number]  PCP:  Benito Mccreedy, MD  Cardiologist:  Rex Kras, DO, [facility] (established care 03/14/2021) Former Cardiology Providers: Dr. Hardie Lora ([place], [place])  Date: 09/28/22 Last Office Visit: 08/16/2022  Chief Complaint  Patient presents with   Follow-up    6 week   Leg Swelling   HPI  Shannon Rosario is a 61 y.o. female whose past medical history and cardiovascular risk factors include: hypertension, Hx of TIA, Non-insulin-dependent diabetes mellitus type 2, hyperlipidemia, obesity due to excess calories, family history of premature CAD, postmenopausal female.  Patient is being followed by the practice for her underlying nonobstructive CAD which is being managed medically.  At last office visit patient was complaining of having lower extremity swelling right greater than left.  Since last office visit she underwent lower extremity venous duplex which was negative for PE and amlodipine was discontinued and started on hydrochlorothiazide.  Since last office visit patient states that lower extremity swelling has essentially resolved.  She is tolerating hydrochlorothiazide well without any side effects or intolerances; however, forgot to get repeat labs to check renal function and electrolytes.  She denies anginal discomfort or heart failure symptoms.  She was referred to sleep medicine for evaluation of sleep apnea this is still pending.  ALLERGIES: Allergies  Allergen Reactions   Bee Venom Anaphylaxis   Pineapple Swelling    Reports it makes her throat swell   Dobutamine Hypertension    MEDICATION LIST PRIOR TO VISIT: Current Meds  Medication Sig   aspirin EC 81 MG tablet Take 1 tablet (81 mg total) by mouth daily. Swallow whole.   celecoxib (CELEBREX) 100 MG capsule Take 100 mg by mouth 2 (two) times daily.   cholecalciferol (VITAMIN D) 1000 UNITS tablet Take 1,000 Units by mouth daily.   cycloSPORINE  (RESTASIS) 0.05 % ophthalmic emulsion 2 (two) times daily.   EPINEPHrine 0.3 mg/0.3 mL IJ SOAJ injection Inject 0.3 mLs (0.3 mg total) into the muscle once as needed (anaphylaxis).   glimepiride (AMARYL) 4 MG tablet Take 4 mg by mouth in the morning and at bedtime.   hydrochlorothiazide (HYDRODIURIL) 25 MG tablet Take 1 tablet (25 mg total) by mouth in the morning.   levocetirizine (XYZAL) 5 MG tablet Take 5 mg by mouth daily.   losartan (COZAAR) 25 MG tablet TAKE 1 TABLET BY MOUTH EVERY EVENING   loteprednol (LOTEMAX) 0.5 % ophthalmic suspension SMARTSIG:In Eye(s)   metFORMIN (GLUCOPHAGE) 500 MG tablet Take 2 tablets by mouth 2 (two) times daily.   nitroGLYCERIN (NITROSTAT) 0.4 MG SL tablet TAKE 1 TAB SUBLINGUALLY EVERY 5 MINUTES AS NEEDED   omeprazole (PRILOSEC) 20 MG capsule Take 20 mg by mouth daily.   rosuvastatin (CRESTOR) 10 MG tablet Take 10 mg by mouth daily.   vitamin E 1000 UNIT capsule Take 1,000 Units by mouth daily.     PAST MEDICAL HISTORY: Past Medical History:  Diagnosis Date   Anxiety    Arthritis    Depression    Diabetes mellitus without complication (HCC)    Family history of adverse reaction to anesthesia    My Mother had some complications,I dont remember what    Fibromyalgia    GERD (gastroesophageal reflux disease)    Heart murmur    Hypercholesterolemia    Hypertension    Obesity    OSA (obstructive sleep apnea)    TIA (transient ischemic attack) 12/24/2010    PAST SURGICAL HISTORY: Past Surgical History:  Procedure  Laterality Date   APPENDECTOMY     CARPAL TUNNEL RELEASE  1987   CESAREAN SECTION  1988   CHOLECYSTECTOMY  1984   KNEE SURGERY Left 1995   LEFT HEART CATH AND CORONARY ANGIOGRAPHY N/A 03/16/2021   Procedure: LEFT HEART CATH AND CORONARY ANGIOGRAPHY;  Surgeon: Nigel Mormon, MD;  Location: [facility];  Service: Cardiovascular;  Laterality: N/A;   NASAL SINUS SURGERY  1992   TONSILLECTOMY  1968    FAMILY HISTORY: The  patient family history includes Allergic rhinitis in her daughter, father, grandchild, grandchild, and mother; Asthma in her mother; Bronchitis in her daughter, grandchild, grandchild, and sister; CAD in her mother; Dementia in her father and mother; Diabetes in her mother; Eczema in her grandchild and sister; Food Allergy in her daughter; Hypertension in her mother; Lupus in her mother; Other in her mother; Sleep apnea in her cousin and sister; Stroke in her mother; Urticaria in her grandchild.  SOCIAL HISTORY:  The patient  reports that she has never smoked. She has been exposed to tobacco smoke. She has never used smokeless tobacco. She reports that she does not currently use alcohol. She reports that she does not use drugs.  REVIEW OF SYSTEMS: Review of Systems  Cardiovascular:  Negative for chest pain, claudication, dyspnea on exertion, irregular heartbeat, leg swelling, near-syncope, orthopnea, palpitations, paroxysmal nocturnal dyspnea and syncope.  Respiratory:  Negative for shortness of breath.   Hematologic/Lymphatic: Negative for bleeding problem.  Musculoskeletal:  Negative for muscle cramps and myalgias.  Neurological:  Negative for dizziness and light-headedness.   PHYSICAL EXAM:    09/28/2022   10:55 AM 09/28/2022   10:52 AM 08/16/2022    1:53 PM  Vitals with BMI  Height  _0    Weight  261 lbs   BMI  35.32   Systolic 992 426 834  Diastolic 98 196 81  Pulse 76 76    Physical Exam  Constitutional: No distress.  Age appropriate, hemodynamically stable.   Neck: No JVD present.  Cardiovascular: Normal rate, regular rhythm, S1 normal, S2 normal, intact distal pulses and normal pulses. Exam reveals no gallop, no S3 and no S4.  No murmur heard. Pulmonary/Chest: Effort normal and breath sounds normal. No stridor. She has no wheezes. She has no rales.  Abdominal: Soft. Bowel sounds are normal. She exhibits no distension. There is no abdominal tenderness.  Musculoskeletal:         General: No edema.     Cervical back: Neck supple.  Neurological: She is alert and oriented to person, place, and time. She has intact cranial nerves (2-12).  Skin: Skin is warm and moist.   CARDIAC DATABASE: EKG: 10/05/2021: Normal sinus rhythm, 98 bpm, low voltage in the precordial leads, old inferior infarct, poor R wave progression, without underlying injury pattern.    Echocardiogram: 09/13/2022:  Study Quality: Technically difficult study.  Normal LV systolic function with visual EF 60-65%. Left ventricle cavity is normal in size. Mild left ventricular hypertrophy. Normal global wall motion. Unable to evaluate diastolic function due to suboptimal image acquisition.  No significant valvular heart disease.  Prior study 09/21/2015: LVEF 60-65%, no regional wall motion abnormalities, grade 1 diastolic impairment, mildly dilated left atrium.   Stress Testing: No results found for this or any previous visit from the past 1095 days.  Heart Catheterization: 03/16/2021:  LM: Normal LAD: Prox 20%, mid 30% stenoses LCx: Small caliber OM2 with mid eccentric 80% stenosis RCA: Mid 20% disease LVEDP 11  mmHg  Lower Extremity Venous Duplex bilateral 09/13/2022:  No evidence of deep vein thrombosis of the bilateral lower extremities  with normal venous return.   LABORATORY DATA:    Latest Ref Rng & Units 03/16/2021    4:51 AM 03/15/2021    8:28 PM 09/30/2016    2:15 PM  CBC  WBC 4.0 - 10.5 K/uL 9.9  8.8  8.1   Hemoglobin 12.0 - 15.0 g/dL 12.0  12.8  12.3   Hematocrit 36.0 - 46.0 % 37.4  38.4  37.4   Platelets 150 - 400 K/uL 287  328  243        Latest Ref Rng & Units 03/16/2021    4:51 AM 03/15/2021    8:28 PM 09/30/2016    2:15 PM  CMP  Glucose 70 - 99 mg/dL 120  122  278   BUN 6 - 20 mg/dL _0 Creatinine 0.44 - 1.00 mg/dL 0.91  0.85  0.83   Sodium 135 - 145 mmol/L 140  140  139   Potassium 3.5 - 5.1 mmol/L 3.7  3.7  3.3   Chloride 98 - 111 mmol/L 106  108  106   CO2  22 - 32 mmol/L _1 Calcium 8.9 - 10.3 mg/dL 8.8  8.9  8.8   Total Protein 6.5 - 8.1 g/dL   6.5   Total Bilirubin 0.3 - 1.2 mg/dL   0.4   Alkaline Phos 38 - 126 U/L   57   AST 15 - 41 U/L   19   ALT 14 - 54 U/L   30     Lipid Panel     Component Value Date/Time   CHOL 121 03/16/2021 0854   TRIG 181 (H) 03/16/2021 0854   HDL 37 (L) 03/16/2021 0854   CHOLHDL 3.3 03/16/2021 0854   VLDL 36 03/16/2021 0854   LDLCALC 48 03/16/2021 0854    No components found for: "NTPROBNP" No results for input(s): "PROBNP" in the last 8760 hours. No results for input(s): "TSH" in the last 8760 hours.   BMP No results for input(s): "NA", "K", "CL", "CO2", "GLUCOSE", "BUN", "CREATININE", "CALCIUM", "GFRNONAA", "GFRAA" in the last 8760 hours.   HEMOGLOBIN A1C Lab Results  Component Value Date   HGBA1C 7.3 (H) 03/16/2021   MPG 162.81 03/16/2021   External Labs: Collected: 02/27/2021 Creatinine 0.89 mg/dL. eGFR: 82 mL/min per 1.73 m Potassium 4.2. AST 20, ALT 26, alkaline phosphatase 72 Hemoglobin 13.5 g/dL, hematocrit 42% Troponin I 13 Hemoglobin A1c: 7.1  Lipid profile: Collected: 06/02/2019 Total cholesterol 145, triglycerides 328, HDL 34, LDL 45  External Labs: Collected: 08/15/2022 available in Care Everywhere. Total cholesterol 128, triglycerides 280, HDL 34, LDL 61, non-HDL 94. BUN 11, creatinine 0.92. eGFR 71. Sodium 142, potassium 3.8, chloride 106, bicarb 23. AST 27, ALT 27, alkaline phosphatase 85. Hemoglobin A1c 8.7.   IMPRESSION:    ICD-10-CM   1. Nonobstructive atherosclerosis of coronary artery  I25.10     2. Benign hypertension  I10     3. Type 2 diabetes mellitus with hyperglycemia, without long-term current use of insulin (HCC)  E11.65     4. Mixed hyperlipidemia  E78.2        RECOMMENDATIONS: Shannon Rosario is a 61 y.o. female whose past medical history and cardiac risk factors include: Nonobstructive CAD, hypertension, Hx of TIA,  Non-insulin-dependent diabetes mellitus type 2, hyperlipidemia, obesity due to excess calories, family history of  premature CAD, postmenopausal female.  Nonobstructive atherosclerosis of coronary artery Denies angina pectoris. Has undergone appropriate ischemic work-up as outlined above. We emphasized the importance of improving her modifiable cardiovascular risk factors. Outside labs from 08/15/2022 independently reviewed available in [place].   Reemphasized the importance of triglyceride management.   LDL currently at goal. Patient is asked to follow-up not having her sleep study done to evaluate for sleep apnea. Reemphasized the importance of blood pressure monitoring/management, goal SBP 130 mmHg.  Benign hypertension Office blood pressures are not well controlled. I have asked her to keep that log of her blood pressures at home to reach out to either myself or PCP for further guidance. Recommended goal SBP of 130 mmHg on average. At the last visit transition her from amlodipine to losartan due to lower extremity swelling.   The dosage of losartan can be uptitrated if needed.  Patient will call us back if her numbers remain elevated at home. Reemphasized importance of low-salt diet.  Type 2 diabetes mellitus with hyperglycemia, without long-term current use of insulin (HCC) Currently on statin therapy, ARB, aspirin. Reemphasized importance of glycemic control. Patient is considering possible endocrine consultation.  Discussed management of at least 2 chronic comorbid conditions, reviewed the results of the lower extremity venous duplex as well as echocardiogram.  Outside labs independently reviewed and noted above for further reference.  FINAL MEDICATION LIST END OF ENCOUNTER: No orders of the defined types were placed in this encounter.  There are no discontinued medications.     Current Outpatient Medications:    aspirin EC 81 MG tablet, Take 1 tablet (81 mg total) by  mouth daily. Swallow whole., Disp: 30 tablet, Rfl: 11   celecoxib (CELEBREX) 100 MG capsule, Take 100 mg by mouth 2 (two) times daily., Disp: , Rfl:    cholecalciferol (VITAMIN D) 1000 UNITS tablet, Take 1,000 Units by mouth daily., Disp: , Rfl:    cycloSPORINE (RESTASIS) 0.05 % ophthalmic emulsion, 2 (two) times daily., Disp: , Rfl:    EPINEPHrine 0.3 mg/0.3 mL IJ SOAJ injection, Inject 0.3 mLs (0.3 mg total) into the muscle once as needed (anaphylaxis)., Disp: 1 Device, Rfl: 0   glimepiride (AMARYL) 4 MG tablet, Take 4 mg by mouth in the morning and at bedtime., Disp: , Rfl:    hydrochlorothiazide (HYDRODIURIL) 25 MG tablet, Take 1 tablet (25 mg total) by mouth in the morning., Disp: 90 tablet, Rfl: 0   levocetirizine (XYZAL) 5 MG tablet, Take 5 mg by mouth daily., Disp: , Rfl:    losartan (COZAAR) 25 MG tablet, TAKE 1 TABLET BY MOUTH EVERY EVENING, Disp: 90 tablet, Rfl: 1   loteprednol (LOTEMAX) 0.5 % ophthalmic suspension, SMARTSIG:In Eye(s), Disp: , Rfl:    metFORMIN (GLUCOPHAGE) 500 MG tablet, Take 2 tablets by mouth 2 (two) times daily., Disp: , Rfl:    nitroGLYCERIN (NITROSTAT) 0.4 MG SL tablet, TAKE 1 TAB SUBLINGUALLY EVERY 5 MINUTES AS NEEDED, Disp: , Rfl:    omeprazole (PRILOSEC) 20 MG capsule, Take 20 mg by mouth daily., Disp: , Rfl:    rosuvastatin (CRESTOR) 10 MG tablet, Take 10 mg by mouth daily., Disp: , Rfl:    vitamin E 1000 UNIT capsule, Take 1,000 Units by mouth daily., Disp: , Rfl:    metoprolol succinate (TOPROL XL) 25 MG 24 hr tablet, Take 1 tablet (25 mg total) by mouth daily., Disp: 90 tablet, Rfl: 0  No orders of the defined types were placed in this encounter.   There are  no Patient Instructions on file for this visit.   --Continue cardiac medications as reconciled in final medication list. --Return in about 1 year (around 09/29/2023) for Follow up, CAD, Review test results. Or sooner if needed. --Continue follow-up with your primary care physician regarding the  management of your other chronic comorbid conditions.  Patient's questions and concerns were addressed to her satisfaction. She voices understanding of the instructions provided during this encounter.   This note was created using a voice recognition software as a result there may be grammatical errors inadvertently enclosed that do not reflect the nature of this encounter. Every attempt is made to correct such errors.  Rex Kras, [place], [facility]  Pager: [phone] Office: [phone]

## 2022-10-10 DIAGNOSIS — M47816 Spondylosis without myelopathy or radiculopathy, lumbar region: Secondary | ICD-10-CM | POA: Diagnosis not present

## 2022-10-10 DIAGNOSIS — M7062 Trochanteric bursitis, left hip: Secondary | ICD-10-CM | POA: Diagnosis not present

## 2022-10-16 DIAGNOSIS — G4733 Obstructive sleep apnea (adult) (pediatric): Secondary | ICD-10-CM | POA: Diagnosis not present

## 2022-10-17 DIAGNOSIS — M47816 Spondylosis without myelopathy or radiculopathy, lumbar region: Secondary | ICD-10-CM | POA: Diagnosis not present

## 2022-10-18 DIAGNOSIS — I10 Essential (primary) hypertension: Secondary | ICD-10-CM | POA: Diagnosis not present

## 2022-10-18 DIAGNOSIS — G4733 Obstructive sleep apnea (adult) (pediatric): Secondary | ICD-10-CM | POA: Diagnosis not present

## 2022-10-24 ENCOUNTER — Other Ambulatory Visit: Payer: Self-pay | Admitting: Cardiology

## 2022-10-24 DIAGNOSIS — M7989 Other specified soft tissue disorders: Secondary | ICD-10-CM | POA: Diagnosis not present

## 2022-10-25 LAB — MAGNESIUM: Magnesium: 1.5 mg/dL — ABNORMAL LOW (ref 1.6–2.3)

## 2022-10-25 LAB — BASIC METABOLIC PANEL
BUN/Creatinine Ratio: 15 (ref 12–28)
BUN: 15 mg/dL (ref 8–27)
CO2: 23 mmol/L (ref 20–29)
Calcium: 9.2 mg/dL (ref 8.7–10.3)
Chloride: 102 mmol/L (ref 96–106)
Creatinine, Ser: 1 mg/dL (ref 0.57–1.00)
Glucose: 251 mg/dL — ABNORMAL HIGH (ref 70–99)
Potassium: 3.9 mmol/L (ref 3.5–5.2)
Sodium: 142 mmol/L (ref 134–144)
eGFR: 64 mL/min/{1.73_m2} (ref >59)

## 2022-11-07 ENCOUNTER — Other Ambulatory Visit: Payer: Self-pay

## 2022-11-07 ENCOUNTER — Telehealth: Payer: Self-pay

## 2022-11-07 DIAGNOSIS — E1165 Type 2 diabetes mellitus with hyperglycemia: Secondary | ICD-10-CM

## 2022-11-07 DIAGNOSIS — E782 Mixed hyperlipidemia: Secondary | ICD-10-CM

## 2022-11-07 MED ORDER — MAGNESIUM OXIDE -MG SUPPLEMENT 200 MG PO TABS: 1.0000 | ORAL_TABLET | Freq: Two times a day (BID) | ORAL | 1 refills | Status: DC

## 2022-11-07 NOTE — Telephone Encounter (Signed)
Spoke with patient and gave lab results and sent in magnesium rx to [facility]. Told patient to go have labs drawn 1 week after starting her magnesium. Patient is requesting a referral to Endocrinologist.

## 2022-11-09 ENCOUNTER — Telehealth: Payer: Self-pay

## 2022-11-09 DIAGNOSIS — E782 Mixed hyperlipidemia: Secondary | ICD-10-CM

## 2022-11-09 NOTE — Telephone Encounter (Signed)
error 

## 2022-11-09 NOTE — Telephone Encounter (Signed)
Best that it come from his PCP

## 2022-11-10 IMAGING — CR DG CHEST 2V
2 series · 2 of 2 positions shown · non-contrast
Comparison: 09/21/2015

CLINICAL DATA: Chest pain, shortness of breath, weakness

EXAM:
CHEST - 2 VIEW

[chest pa]
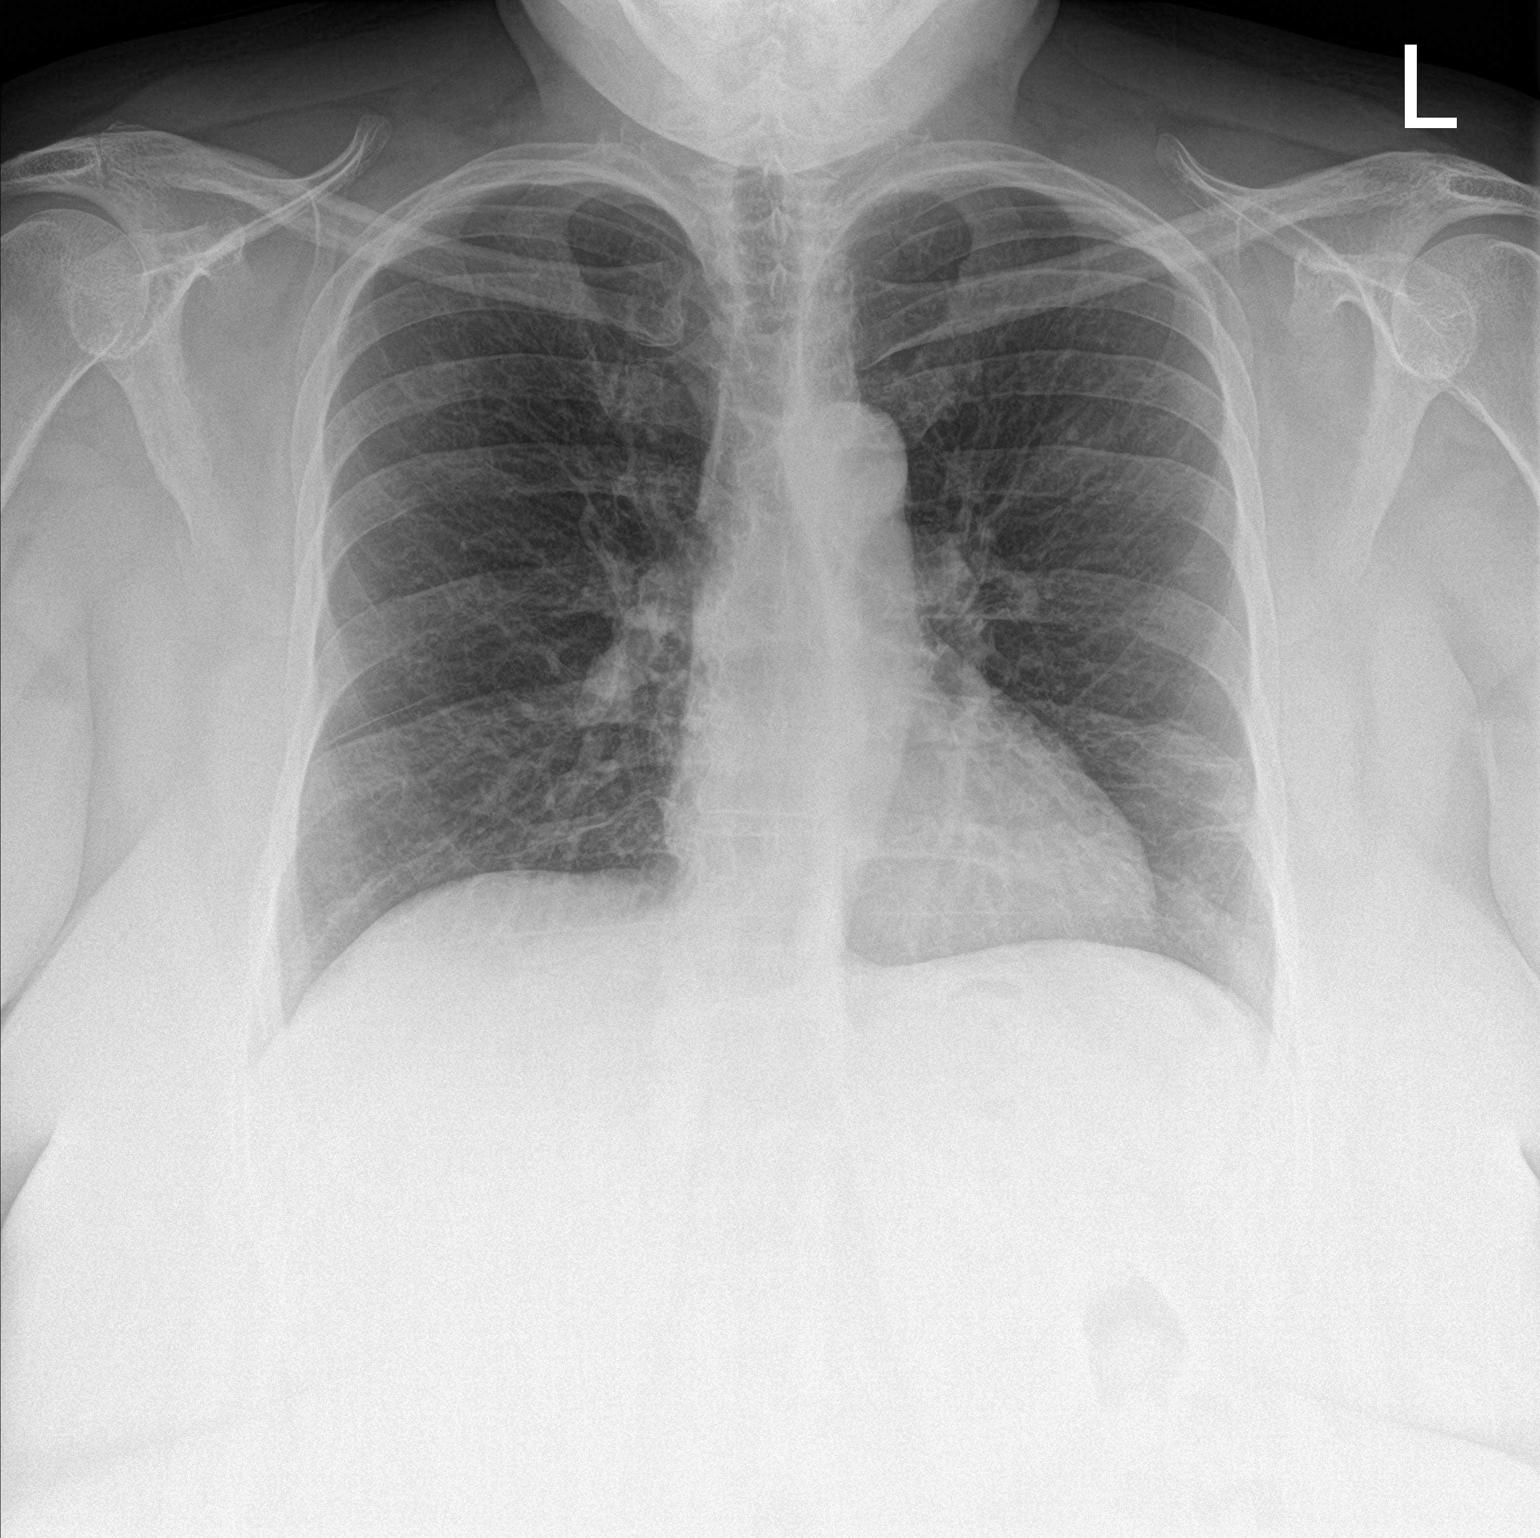

[chest lat]
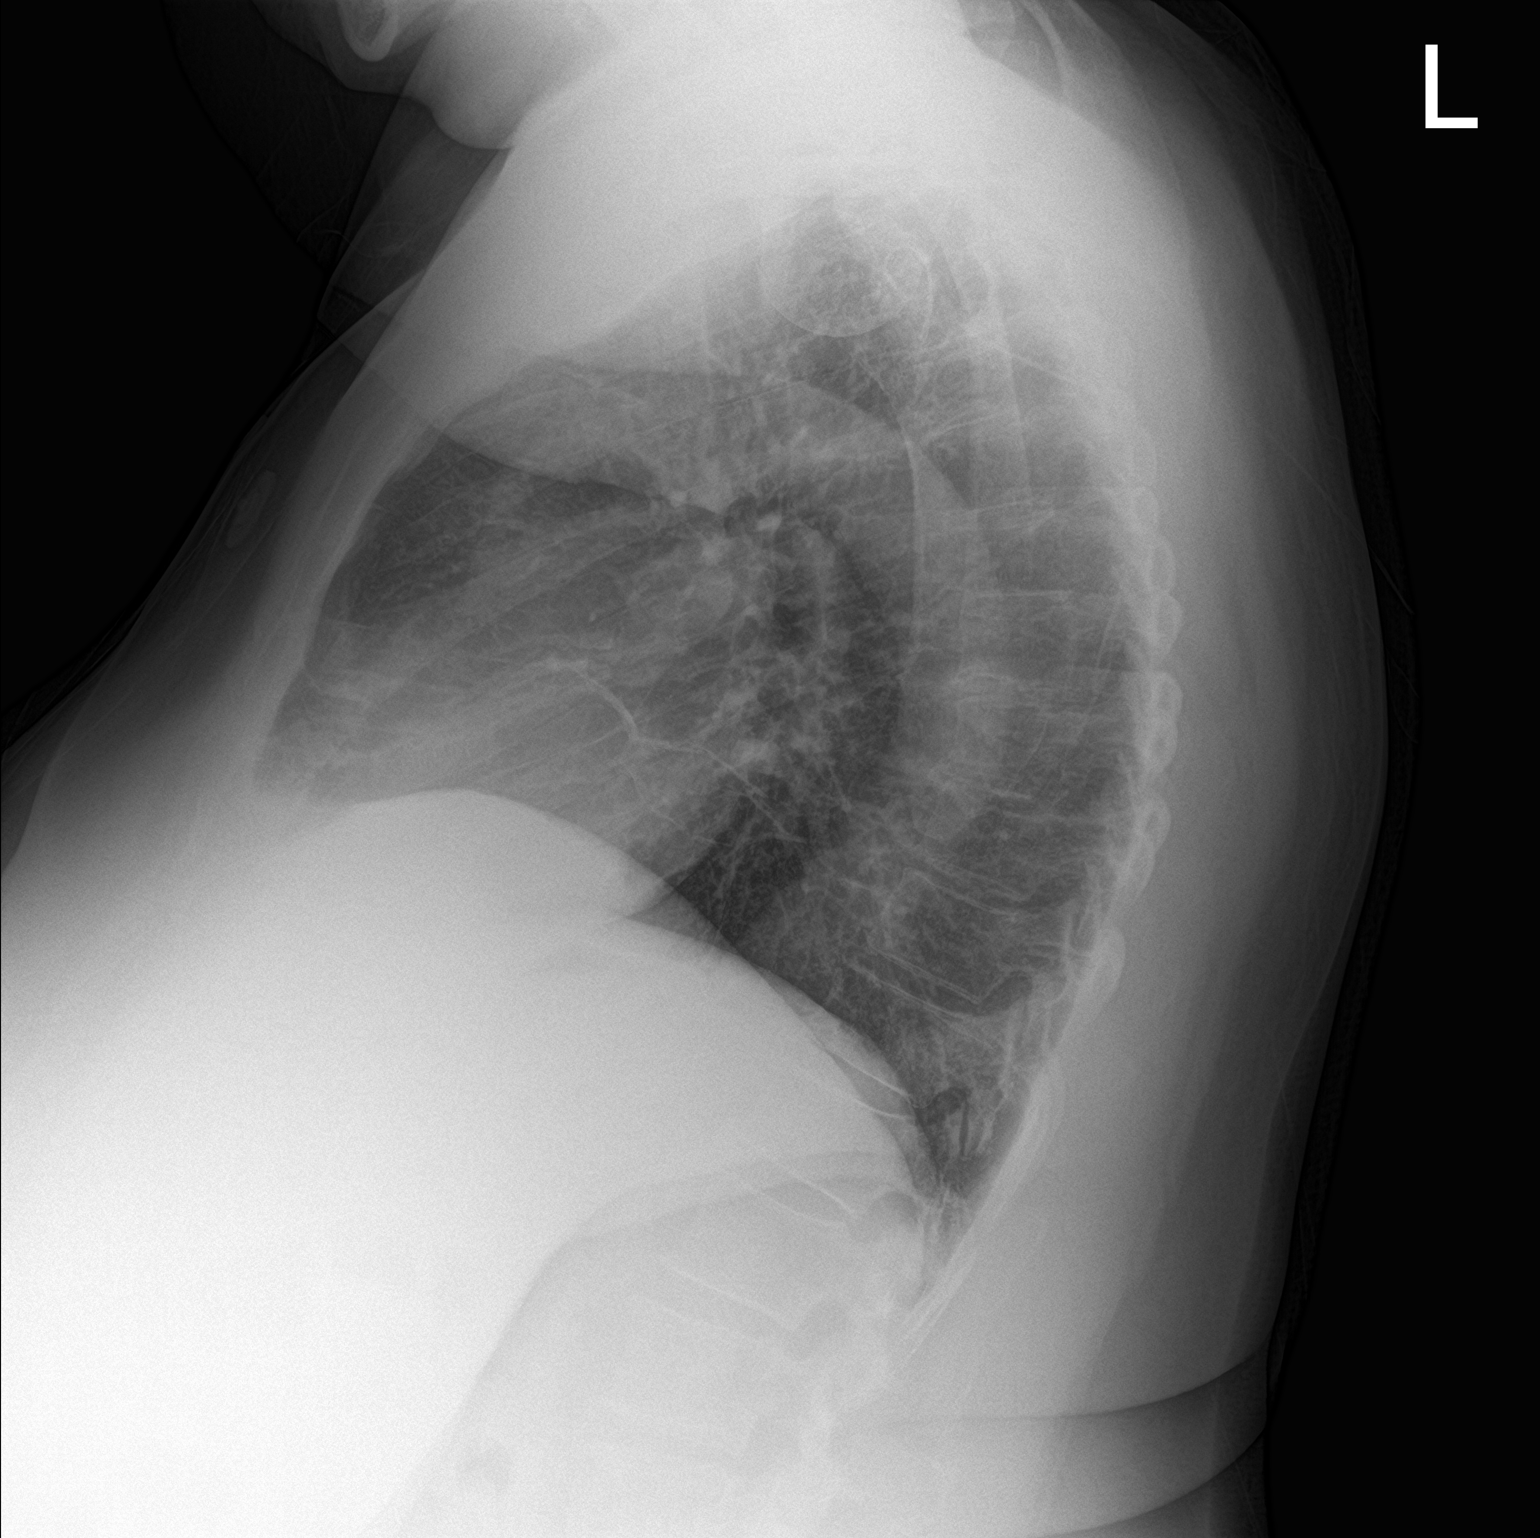

[2 of 2 positions shown; findings below may reference images not displayed]

FINDINGS: Dominantly linear densities are noted in the lingula, likely
scarring. Right lung clear. Heart is normal size. No effusions. No
acute bony abnormality.
IMPRESSION: Lingular scarring.  No active disease.

## 2022-11-11 ENCOUNTER — Other Ambulatory Visit: Payer: Self-pay | Admitting: Cardiology

## 2022-11-11 DIAGNOSIS — I10 Essential (primary) hypertension: Secondary | ICD-10-CM

## 2022-11-14 ENCOUNTER — Telehealth: Payer: Self-pay

## 2022-11-14 MED ORDER — MAGNESIUM OXIDE -MG SUPPLEMENT 200 MG PO TABS: 1.0000 | ORAL_TABLET | Freq: Two times a day (BID) | ORAL | 1 refills | Status: DC

## 2022-11-14 NOTE — Telephone Encounter (Signed)
Spoke with pharmacy magnesium oxide doesn't come in '200mg'$  bid so they are filling '400mg'$  qd

## 2022-11-14 NOTE — Telephone Encounter (Signed)
Called patient and let her know she needs to get referral to Endocrinologist from PCP per Dr. Einar Gip.  Resent her magnesium to her pharmancy as requested by patient.

## 2022-11-26 DIAGNOSIS — M47816 Spondylosis without myelopathy or radiculopathy, lumbar region: Secondary | ICD-10-CM | POA: Diagnosis not present

## 2022-11-26 DIAGNOSIS — M7062 Trochanteric bursitis, left hip: Secondary | ICD-10-CM | POA: Diagnosis not present

## 2022-11-27 ENCOUNTER — Telehealth: Payer: Self-pay | Admitting: Pharmacist

## 2022-11-27 NOTE — Progress Notes (Signed)
[place] Team  Medication Adherence Quality Measures  11/27/22  Shannon Rosario 03/15/1961  Provider:  Dr. Vista Lawman  Practice:  [facility]  Medication Adherence Measure(s):   Medication Adherence for Cholesterol Medication  Medication Adherence Findings:  Placed telephonic outreach to [name]. The voice mail box is full and unable to accept any messages.  Last filled rosuvastatin 10 mg #90 tablets on  08/24/2022. Patient is past due for a refill at this time. Will send patient a MyChart message.   Loretha Brasil, PharmD [place] Pharmacist Office: [phone]

## 2022-11-27 NOTE — Progress Notes (Signed)
[facility])                                            [place] Team    11/27/2022  Shannon Rosario January 24, 1961 [id number]  Received return call from patient. Patient reports no adverse effects from rosuvastatin and that her pharmacy has just notified her that she has 3 prescriptions ready to pick up at the pharmacy. All questions answered and patient was appreciative of my call.   Loretha Brasil, PharmD [place] Pharmacist Office: [phone]

## 2022-11-29 DIAGNOSIS — E1142 Type 2 diabetes mellitus with diabetic polyneuropathy: Secondary | ICD-10-CM | POA: Diagnosis not present

## 2022-11-29 DIAGNOSIS — I10 Essential (primary) hypertension: Secondary | ICD-10-CM | POA: Diagnosis not present

## 2022-11-29 DIAGNOSIS — E1165 Type 2 diabetes mellitus with hyperglycemia: Secondary | ICD-10-CM | POA: Diagnosis not present

## 2022-11-29 DIAGNOSIS — E782 Mixed hyperlipidemia: Secondary | ICD-10-CM | POA: Diagnosis not present

## 2022-12-03 NOTE — Progress Notes (Signed)
OUTPATIENT PHYSICAL THERAPY THORACOLUMBAR EVALUATION   Patient Name: Shannon Rosario MRN: [id number] DOB:Jan 02, 1961, 61 y.o., female Today's Date: 12/04/2022  END OF SESSION:  PT End of Session - 12/04/22 1155     Visit Number 1    Date for PT Re-Evaluation 02/26/23    PT Start Time 0932    PT Stop Time 1010    PT Time Calculation (min) 38 min    Activity Tolerance Patient tolerated treatment well    Behavior During Therapy WFL for tasks assessed/performed             Past Medical History:  Diagnosis Date   Anxiety    Arthritis    Depression    Diabetes mellitus without complication ([place])    Family history of adverse reaction to anesthesia    My Mother had some complications,I dont remember what    Fibromyalgia    GERD (gastroesophageal reflux disease)    Heart murmur    Hypercholesterolemia    Hypertension    Obesity    OSA (obstructive sleep apnea)    TIA (transient ischemic attack) 12/24/2010   Past Surgical History:  Procedure Laterality Date   APPENDECTOMY     CARPAL TUNNEL RELEASE  [place] Left 1995   LEFT HEART CATH AND CORONARY ANGIOGRAPHY N/A 03/16/2021   Procedure: LEFT HEART CATH AND CORONARY ANGIOGRAPHY;  Surgeon: Nigel Mormon, MD;  Location: [facility];  Service: Cardiovascular;  Laterality: N/A;   [place]   Patient Active Problem List   Diagnosis Date Noted   Angina pectoris, unstable ([place]) 08/11/2021   Non-restorative sleep 08/11/2021   Unstable angina ([place]) 03/16/2021   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   Fire ant bite 10/22/2016   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/2016   Allergic urticaria 10/22/2016   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 10/22/2016   Obstructive sleep apnea treated with continuous positive airway pressure (CPAP) 10/22/2016   Chest pain 09/21/2015    Essential hypertension 09/21/2015   Diabetes ([place]) 09/21/2015   Pain in the chest     PCP: Benito Mccreedy, MD  REFERRING PROVIDER: Laroy Apple  REFERRING DIAG: Cervicalgia and lumbar spondylosis, L shoulder and lat hip pain  Rationale for Evaluation and Treatment: Rehabilitation  THERAPY DIAG:  Muscle weakness (generalized)  Cramp and spasm  Cervicalgia  Chronic right shoulder pain  ONSET DATE: 11/29/22  SUBJECTIVE:  SUBJECTIVE STATEMENT: Patient reports low back pain, but improved if she does not do too much. She had injections in Oct. Which seem to have helped. Her R shoulder pain has increased to where she is limited in using it. She fell in [place] and has had trouble since then.  PERTINENT HISTORY:  Patient reports she fell in February, sustaining injuries to L hip, lower back, L shoulder, neck. She received an injection in her L shoulder for pain and inflammation on 2/13. No hip injection per Dr decision.   PAIN:  Are you having pain? Yes: NPRS scale: 10/10 Pain location: R shoulder, distal  deltoid Pain description: pressure Aggravating factors: Sometimes raising up above her head, stirring a mixing bowl Relieving factors: Biofreeze  PRECAUTIONS: None  WEIGHT BEARING RESTRICTIONS: No  FALLS:  Has patient fallen in last 6 months? No  LIVING ENVIRONMENT: Lives with: lives with their family Lives in: House/apartment Stairs: Yes: Internal: 14 steps; on left going up and External: 1 steps; none Has following equipment at home: None  OCCUPATION: On disability  PLOF: Independent  PATIENT GOALS: Patient would like to have relief from shoulder pain and improve her ROM to normal.  NEXT MD VISIT:   OBJECTIVE:   DIAGNOSTIC FINDINGS:  Lumbar spine MRI ordered.   SCREENING  FOR RED FLAGS: Bowel or bladder incontinence: No Spinal tumors: No Cauda equina syndrome: No Compression fracture: No Abdominal aneurysm: No  COGNITION: Overall cognitive status: Within functional limits for tasks assessed     SENSATION: Not tested  POSTURE: decreased thoracic kyphosis  PALPATION: Tight and TTP along B cervical paraspinals, increased around upper traps and medial scapulae, tight pects, tight scalenes, TTP on deltoid insertion. R supraspinatus tendon and biceps tendons both with severe TTP  CERVICAL ROM:   AROM eval  Flexion 100%  Extension 100  Right lateral flexion 80  Left lateral flexion 80  Right rotation 100  Left rotation 100   (Blank rows = not tested)  UPPER EXTREMITY ROM:   LUE WNL, R shoulder flexion WNL, but painful, abd to 90 degrees, painful  UPPER EXTREMITY MMT:  LUE WNL, R shoulder MMT limited due to pain with any resistance, elbow WFL   LUMBAR SPECIAL TESTS:  Deferred as patient now denies low back pain after receiving injections SHOULDER TESTS: Hawkins Test: (+) R       Painful arc test (+) on R      Empty Can Test (+) on R       Spurling's test (-) B. GAIT: Distance walked: In clinic distances Assistive device utilized: None Level of assistance: Complete Independence Comments: No obvious gait deviations.  TODAY'S TREATMENT:                                                                                                                              DATE:  12/04/22 Education, HEP    PATIENT EDUCATION:  Education details: POC HEP Person educated: Patient Education  method: Explanation, Demonstration, and Handouts Education comprehension: verbalized understanding and returned demonstration  HOME EXERCISE PROGRAM: [id number]  ASSESSMENT:  CLINICAL IMPRESSION: Patient is a 61 y.o. who was seen today for physical therapy evaluation and treatment due to R shoulder pain, cervical, and lumbar radiculopathy. Upon assessment, she  reports her back pain is much improved after receiving injections. Her R shoulder pain has exacerbated significantly and she does have arthritis in her neck, with some pain. Her R shoulder and neck are TTP with tightness and TP. She tested positive for impingement syndrome on the R and demonstrates weakness and tight muscles in the entire upper quadrant. She also appears to have some continued R shoulder Bursitis.  OBJECTIVE IMPAIRMENTS: decreased coordination, decreased ROM, decreased strength, hypomobility, increased fascial restrictions, increased muscle spasms, improper body mechanics, postural dysfunction, and pain.   ACTIVITY LIMITATIONS: carrying, lifting, sleeping, reach over head, and caring for others  PARTICIPATION LIMITATIONS: cleaning, laundry, shopping, and community activity  PERSONAL FACTORS: Past/current experiences are also affecting patient's functional outcome.   REHAB POTENTIAL: Good  CLINICAL DECISION MAKING: Evolving/moderate complexity  EVALUATION COMPLEXITY: Moderate   GOALS: Goals reviewed with patient? Yes  SHORT TERM GOALS: Target date: 12/22/22  I with initial HEP Baseline: Goal status: INITIAL  LONG TERM GOALS: Target date: 02/26/23  I with final HEP Baseline:  Goal status: INITIAL  2.  Decrease shoulder pain with activity to <4/10 Baseline: 10/10 Goal status: INITIAL  3.  Increase R shoulder strength to 4/5, including scapular stabilizers. Baseline: 3/5 Goal status: INITIAL  4.  Patient will achieve at least 120 degrees of R shoulder abd, with pain< 4/10 Baseline:  Goal status: INITIAL  PLAN:  PT FREQUENCY: 2x/week  PT DURATION: 12 weeks  PLANNED INTERVENTIONS: Therapeutic exercises, Therapeutic activity, Neuromuscular re-education, Balance training, Gait training, Patient/Family education, Self Care, Joint mobilization, Dry Needling, Electrical stimulation, Cryotherapy, Moist heat, Ionotophoresis '4mg'$ /ml Dexamethasone, and Manual  therapy.  PLAN FOR NEXT SESSION: ROM, strength, STM and joint mobs   Marcelina Morel, DPT 12/04/2022, 12:02 PM

## 2022-12-04 ENCOUNTER — Ambulatory Visit: Payer: Medicare Other | Attending: Physical Medicine and Rehabilitation | Admitting: Physical Therapy

## 2022-12-04 DIAGNOSIS — M25511 Pain in right shoulder: Secondary | ICD-10-CM | POA: Insufficient documentation

## 2022-12-04 DIAGNOSIS — M542 Cervicalgia: Secondary | ICD-10-CM | POA: Diagnosis not present

## 2022-12-04 DIAGNOSIS — M6281 Muscle weakness (generalized): Secondary | ICD-10-CM | POA: Insufficient documentation

## 2022-12-04 DIAGNOSIS — M5416 Radiculopathy, lumbar region: Secondary | ICD-10-CM | POA: Diagnosis not present

## 2022-12-04 DIAGNOSIS — R252 Cramp and spasm: Secondary | ICD-10-CM | POA: Insufficient documentation

## 2022-12-04 DIAGNOSIS — G8929 Other chronic pain: Secondary | ICD-10-CM | POA: Diagnosis not present

## 2022-12-04 DIAGNOSIS — R262 Difficulty in walking, not elsewhere classified: Secondary | ICD-10-CM | POA: Diagnosis present

## 2022-12-07 ENCOUNTER — Ambulatory Visit: Payer: Medicare Other | Admitting: Physical Therapy

## 2022-12-07 DIAGNOSIS — R252 Cramp and spasm: Secondary | ICD-10-CM | POA: Diagnosis not present

## 2022-12-07 DIAGNOSIS — M542 Cervicalgia: Secondary | ICD-10-CM

## 2022-12-07 DIAGNOSIS — M6281 Muscle weakness (generalized): Secondary | ICD-10-CM | POA: Diagnosis not present

## 2022-12-07 DIAGNOSIS — G8929 Other chronic pain: Secondary | ICD-10-CM | POA: Diagnosis not present

## 2022-12-07 DIAGNOSIS — M25511 Pain in right shoulder: Secondary | ICD-10-CM | POA: Diagnosis not present

## 2022-12-07 DIAGNOSIS — M5416 Radiculopathy, lumbar region: Secondary | ICD-10-CM | POA: Diagnosis not present

## 2022-12-07 DIAGNOSIS — R262 Difficulty in walking, not elsewhere classified: Secondary | ICD-10-CM | POA: Diagnosis not present

## 2022-12-07 NOTE — Therapy (Signed)
OUTPATIENT PHYSICAL THERAPY TREATMENT NOTE   Patient Name: Shannon Rosario MRN: [id number] DOB:[birth year], 61 y.o., female Today's Date: 12/07/2022  PCP: see eval REFERRING PROVIDER: see eval  END OF SESSION:   PT End of Session - 12/07/22 1057     Visit Number 2    Date for PT Re-Evaluation 02/26/23    PT Start Time 1100    PT Stop Time 1145    PT Time Calculation (min) 45 min             Past Medical History:  Diagnosis Date   Anxiety    Arthritis    Depression    Diabetes mellitus without complication ([place])    Family history of adverse reaction to anesthesia    My Mother had some complications,I dont remember what    Fibromyalgia    GERD (gastroesophageal reflux disease)    Heart murmur    Hypercholesterolemia    Hypertension    Obesity    OSA (obstructive sleep apnea)    TIA (transient ischemic attack) 12/24/2010   Past Surgical History:  Procedure Laterality Date   [place] Left 1995   LEFT HEART CATH AND CORONARY ANGIOGRAPHY N/A 03/16/2021   Procedure: LEFT HEART CATH AND CORONARY ANGIOGRAPHY;  Surgeon: Nigel Mormon, MD;  Location: [facility];  Service: Cardiovascular;  Laterality: N/A;   [place]   Patient Active Problem List   Diagnosis Date Noted   Angina pectoris, unstable ([place]) 08/11/2021   Non-restorative sleep 08/11/2021   Unstable angina ([place]) 03/16/2021   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   Fire ant bite 10/22/2016   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/2016   Allergic urticaria 10/22/2016   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 10/22/2016   Obstructive sleep apnea treated with continuous positive airway pressure (CPAP) 10/22/2016   Chest pain 09/21/2015   Essential hypertension 09/21/2015   Diabetes ([place]) 09/21/2015   Pain in the  chest     REFERRING DIAG: cerv,shld and back  THERAPY DIAG:  Muscle weakness (generalized)  Cramp and spasm  Cervicalgia  Chronic right shoulder pain  Rationale for Evaluation and Treatment Rehabilitation  PERTINENT HISTORY: see note  PRECAUTIONS: none  OBJECTIVE: (objective measures completed at initial evaluation unless otherwise dated)  ONSET DATE: 11/29/22  SUBJECTIVE STATEMENT: rt shld is bad, increases with mvmt   PERTINENT HISTORY:  Patient reports she fell in February, sustaining injuries to L hip, lower back, L shoulder, neck. She received an injection in her L shoulder for pain and inflammation on 2/13. No hip injection per Dr decision.    PAIN:  Are you having pain? Yes: NPRS scale: 10/10 Pain location: R shoulder, distal  deltoid Pain description: pressure Aggravating factors: Sometimes raising up above her head, stirring a mixing bowl Relieving factors: Biofreeze   PRECAUTIONS: None   WEIGHT BEARING RESTRICTIONS: No   FALLS:  Has patient fallen in last 6 months? No   LIVING ENVIRONMENT: Lives with: lives with their family Lives in: House/apartment Stairs: Yes: Internal: 14 steps; on left going up and External: 1 steps; none Has following equipment at home: None   OCCUPATION: On disability   PLOF: Independent   PATIENT GOALS: Patient would like to have relief from shoulder pain and improve her ROM to normal.   NEXT MD VISIT:    OBJECTIVE:    DIAGNOSTIC FINDINGS:  Lumbar spine MRI ordered.     SCREENING FOR RED FLAGS: Bowel or bladder incontinence: No Spinal tumors: No Cauda equina syndrome: No Compression fracture: No Abdominal aneurysm: No   COGNITION: Overall cognitive status: Within functional limits for tasks assessed                           SENSATION: Not tested   POSTURE: decreased thoracic kyphosis   PALPATION: Tight and TTP along B cervical paraspinals, increased around upper traps and medial scapulae, tight pects, tight scalenes, TTP on deltoid insertion. R supraspinatus tendon and biceps tendons both with severe TTP   CERVICAL ROM:    AROM eval  Flexion 100%  Extension 100  Right lateral flexion 80  Left lateral flexion 80  Right rotation 100  Left rotation 100   (Blank rows = not tested)   UPPER EXTREMITY ROM:   LUE WNL, R shoulder flexion WNL, but painful, abd to 90 degrees, painful   UPPER EXTREMITY MMT:  LUE WNL, R shoulder MMT limited due to pain with any resistance, elbow WFL     LUMBAR SPECIAL TESTS:  Deferred as patient now denies low back pain after receiving injections SHOULDER TESTS: Hawkins Test: (+) R                                     Painful arc test (+) on R                                    Empty Can Test (+) on R                                     Spurling's test (-) B. GAIT: Distance walked: In clinic distances Assistive device utilized: None Level of assistance: Complete Independence Comments: No obvious gait deviations.   TODAY'S TREATMENT:  DATE:   12/07/22 UBE L 1 1.5 min fwd/1.5 min backward Yellow tband shld ext,row and ER 10x  Cerv retraction head on ball on wall hold 3 sec 10 x 2# empty can 10 x ER 2# 10x at side and at 90 degrees abd PROM RT shld , jt mobs and capsular stretch IFC/MH to RT shld Ionto RT shld 1.2 cc dex      12/04/22 Education, HEP      PATIENT EDUCATION:  Education details: POC HEP Person educated: Patient Education method: Consulting civil engineer, Media planner, and Handouts Education comprehension: verbalized understanding and returned demonstration   HOME EXERCISE PROGRAM: [id number]   ASSESSMENT:   CLINICAL IMPRESSION:pt  arrives with chief c/o RT shld ,states back and neck doing pretty well. Pt states she is just full of arthritis. Progressed ex with postural cuing needed, ex aggrivated shld. Pt with good PROM . Pt states doing hep for STGmet    OBJECTIVE IMPAIRMENTS: decreased coordination, decreased ROM, decreased strength, hypomobility, increased fascial restrictions, increased muscle spasms, improper body mechanics, postural dysfunction, and pain.    ACTIVITY LIMITATIONS: carrying, lifting, sleeping, reach over head, and caring for others   PARTICIPATION LIMITATIONS: cleaning, laundry, shopping, and community activity   PERSONAL FACTORS: Past/current experiences are also affecting patient's functional outcome.    REHAB POTENTIAL: Good   CLINICAL DECISION MAKING: Evolving/moderate complexity   EVALUATION COMPLEXITY: Moderate     GOALS: Goals reviewed with patient? Yes   SHORT TERM GOALS: Target date: 12/22/22   I with initial HEP Baseline: Goal status: 12/07/22 MET   LONG TERM GOALS: Target date: 02/26/23   I with final HEP Baseline:  Goal status: INITIAL   2.  Decrease shoulder pain with activity to <4/10 Baseline: 10/10 Goal status: INITIAL   3.  Increase R shoulder strength to 4/5, including scapular stabilizers. Baseline: 3/5 Goal status: INITIAL   4.  Patient will achieve at least 120 degrees of R shoulder abd, with pain< 4/10 Baseline:  Goal status: INITIAL   PLAN:   PT FREQUENCY: 2x/week   PT DURATION: 12 weeks   PLANNED INTERVENTIONS: Therapeutic exercises, Therapeutic activity, Neuromuscular re-education, Balance training, Gait training, Patient/Family education, Self Care, Joint mobilization, Dry Needling, Electrical stimulation, Cryotherapy, Moist heat, Ionotophoresis 65m/ml Dexamethasone, and Manual therapy.   PLAN FOR NEXT SESSION: assess and progress           CSoil scientistAll Conversations on this Encounter Media From this encounter Electronic  signature on 12/04/2022 9:28 AM - 1 of 3 e-signatures recorded  Scan on 12/04/2022 9:28 AM by ZCinda Quest   PLuther PTA 12/07/2022, 10:58 AM         [place] [address]Phone: [phone]  Fax:  [phone] Patient Details  Name: Shannon PetkoMRN: [id number] of Birth: 1Sep 20, 1962Referring Provider:  OBenito Mccreedy MD  Encounter Date: 12/07/2022   PLaqueta Carina PTA 12/07/2022, 10:57 AM  [place] [address]Phone: [phone]  Fax:  [phone]

## 2022-12-13 ENCOUNTER — Ambulatory Visit: Payer: Medicare Other | Admitting: Physical Therapy

## 2022-12-13 ENCOUNTER — Encounter: Payer: Self-pay | Admitting: Physical Therapy

## 2022-12-13 DIAGNOSIS — R262 Difficulty in walking, not elsewhere classified: Secondary | ICD-10-CM

## 2022-12-13 DIAGNOSIS — M25511 Pain in right shoulder: Secondary | ICD-10-CM | POA: Diagnosis not present

## 2022-12-13 DIAGNOSIS — R252 Cramp and spasm: Secondary | ICD-10-CM

## 2022-12-13 DIAGNOSIS — M542 Cervicalgia: Secondary | ICD-10-CM | POA: Diagnosis not present

## 2022-12-13 DIAGNOSIS — M5416 Radiculopathy, lumbar region: Secondary | ICD-10-CM

## 2022-12-13 DIAGNOSIS — M6281 Muscle weakness (generalized): Secondary | ICD-10-CM

## 2022-12-13 DIAGNOSIS — G8929 Other chronic pain: Secondary | ICD-10-CM

## 2022-12-13 NOTE — Therapy (Signed)
OUTPATIENT PHYSICAL THERAPY TREATMENT NOTE   Patient Name: Shannon Rosario MRN: [id number] DOB:[birth year], 61 y.o., female Today's Date: 12/13/2022  PCP: see eval REFERRING PROVIDER: see eval  END OF SESSION:   PT End of Session - 12/13/22 1112     Visit Number 3    Date for PT Re-Evaluation 02/26/23    PT Start Time 1108    PT Stop Time 1142    PT Time Calculation (min) 34 min    Activity Tolerance Patient tolerated treatment well    Behavior During Therapy WFL for tasks assessed/performed              Past Medical History:  Diagnosis Date   Anxiety    Arthritis    Depression    Diabetes mellitus without complication ([place])    Family history of adverse reaction to anesthesia    My Mother had some complications,I dont remember what    Fibromyalgia    GERD (gastroesophageal reflux disease)    Heart murmur    Hypercholesterolemia    Hypertension    Obesity    OSA (obstructive sleep apnea)    TIA (transient ischemic attack) 12/24/2010   Past Surgical History:  Procedure Laterality Date   [place] Left 1995   LEFT HEART CATH AND CORONARY ANGIOGRAPHY N/A 03/16/2021   Procedure: LEFT HEART CATH AND CORONARY ANGIOGRAPHY;  Surgeon: Nigel Mormon, MD;  Location: [facility];  Service: Cardiovascular;  Laterality: N/A;   [place]   Patient Active Problem List   Diagnosis Date Noted   Angina pectoris, unstable ([place]) 08/11/2021   Non-restorative sleep 08/11/2021   Unstable angina ([place]) 03/16/2021   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   Fire ant bite 10/22/2016   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/2016   Allergic urticaria 10/22/2016   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 10/22/2016   Obstructive sleep apnea treated with continuous positive airway pressure (CPAP)  10/22/2016   Chest pain 09/21/2015   Essential hypertension 09/21/2015   Diabetes ([place]) 09/21/2015   Pain in the chest     REFERRING DIAG: cerv,shld and back  THERAPY DIAG:  Muscle weakness (generalized)  Cramp and spasm  Cervicalgia  Lumbar radiculopathy  Difficulty in walking, not elsewhere classified  Chronic right shoulder pain  Rationale for Evaluation and Treatment Rehabilitation  PERTINENT HISTORY: see note  PRECAUTIONS: none  OBJECTIVE: (objective measures completed at initial evaluation unless otherwise dated)  ONSET DATE: 11/29/22  SUBJECTIVE STATEMENT: R shoulder pain remains her primary issue. Her cousin was killed last night so she is sad and stressed.   PERTINENT HISTORY:  Patient reports she fell in February, sustaining injuries to L hip, lower back, L shoulder, neck. She received an injection in her L shoulder for pain and inflammation on 2/13. No hip injection per Dr decision.    PAIN:  Are you having pain? Yes: NPRS scale: 10/10 Pain location: R shoulder, distal  deltoid Pain description: pressure Aggravating factors: Sometimes raising up above her head, stirring a mixing bowl Relieving factors: Biofreeze   PRECAUTIONS: None   WEIGHT BEARING RESTRICTIONS: No   FALLS:  Has patient fallen in last 6 months? No   LIVING ENVIRONMENT: Lives with: lives with their family Lives in: House/apartment Stairs: Yes: Internal: 14 steps; on left going up and External: 1 steps; none Has following equipment at home: None   OCCUPATION: On disability   PLOF: Independent   PATIENT GOALS: Patient would like to have relief from shoulder pain and improve her ROM to normal.   NEXT MD VISIT:    OBJECTIVE:    DIAGNOSTIC FINDINGS:  Lumbar spine MRI ordered.     SCREENING FOR  RED FLAGS: Bowel or bladder incontinence: No Spinal tumors: No Cauda equina syndrome: No Compression fracture: No Abdominal aneurysm: No   COGNITION: Overall cognitive status: Within functional limits for tasks assessed                          SENSATION: Not tested   POSTURE: decreased thoracic kyphosis   PALPATION: Tight and TTP along B cervical paraspinals, increased around upper traps and medial scapulae, tight pects, tight scalenes, TTP on deltoid insertion. R supraspinatus tendon and biceps tendons both with severe TTP   CERVICAL ROM:    AROM eval  Flexion 100%  Extension 100  Right lateral flexion 80  Left lateral flexion 80  Right rotation 100  Left rotation 100   (Blank rows = not tested)   UPPER EXTREMITY ROM:   LUE WNL, R shoulder flexion WNL, but painful, abd to 90 degrees, painful   UPPER EXTREMITY MMT:  LUE WNL, R shoulder MMT limited due to pain with any resistance, elbow WFL     LUMBAR SPECIAL TESTS:  Deferred as patient now denies low back pain after receiving injections SHOULDER TESTS: Hawkins Test: (+) R                                     Painful arc test (+) on R                                    Empty Can Test (+) on R                                     Spurling's test (-) B. GAIT: Distance walked: In clinic distances Assistive device utilized: None Level of assistance: Complete Independence Comments: No obvious gait deviations.   TODAY'S TREATMENT:  DATE:  12/13/22 UBE L 1 x 2 min forward/2 min backwards, stopped at 3 min due to post rotation hurting STM to upper traps, pects on R Shoulder joint mobs into inferior and post glide  Manual cervical traction x 60 sec Scapular mobilization into depression Wall stars- yellow Tband, painful to move R hand, so stabilized with R while moving L hand Wall slides- towel on  wall, 10 flex, 10 scaption on R. VASO to R shoulder 10 min, 34 mod pressure Ionto patch to R shoulder 1.2 cc Dextro  12/07/22 UBE L 1 1.5 min fwd/1.5 min backward Yellow tband shld ext,row and ER 10x  Cerv retraction head on ball on wall hold 3 sec 10 x 2# empty can 10 x ER 2# 10x at side and at 90 degrees abd PROM RT shld , jt mobs and capsular stretch IFC/MH to RT shld Ionto RT shld 1.2 cc dex   12/04/22 Education, HEP      PATIENT EDUCATION:  Education details: POC HEP Person educated: Patient Education method: Consulting civil engineer, Media planner, and Handouts Education comprehension: verbalized understanding and returned demonstration   HOME EXERCISE PROGRAM: [id number]   ASSESSMENT:   CLINICAL IMPRESSION:Patient reports continued R shoulder pain as well as some emotional stress due to her cousin being killed. Treatment focused on mobilization of R shoulder, STM and stretch, then strengthening and finished with treatment for the acute pain and inflammation.     OBJECTIVE IMPAIRMENTS: decreased coordination, decreased ROM, decreased strength, hypomobility, increased fascial restrictions, increased muscle spasms, improper body mechanics, postural dysfunction, and pain.    ACTIVITY LIMITATIONS: carrying, lifting, sleeping, reach over head, and caring for others   PARTICIPATION LIMITATIONS: cleaning, laundry, shopping, and community activity   PERSONAL FACTORS: Past/current experiences are also affecting patient's functional outcome.    REHAB POTENTIAL: Good   CLINICAL DECISION MAKING: Evolving/moderate complexity   EVALUATION COMPLEXITY: Moderate     GOALS: Goals reviewed with patient? Yes   SHORT TERM GOALS: Target date: 12/22/22   I with initial HEP Baseline: Goal status: 12/07/22 MET   LONG TERM GOALS: Target date: 02/26/23   I with final HEP Baseline:  Goal status: INITIAL   2.  Decrease shoulder pain with activity to <4/10 Baseline: 10/10 Goal status:  ongoing   3.  Increase R shoulder strength to 4/5, including scapular stabilizers. Baseline: 3/5 Goal status: ongoing   4.  Patient will achieve at least 120 degrees of R shoulder abd, with pain< 4/10 Baseline:  Goal status: INITIAL   PLAN:   PT FREQUENCY: 2x/week   PT DURATION: 12 weeks   PLANNED INTERVENTIONS: Therapeutic exercises, Therapeutic activity, Neuromuscular re-education, Balance training, Gait training, Patient/Family education, Self Care, Joint mobilization, Dry Needling, Electrical stimulation, Cryotherapy, Moist heat, Ionotophoresis 59m/ml Dexamethasone, and Manual therapy.   PLAN FOR NEXT SESSION: assess and progress           CSoil scientistAll Conversations on this Encounter Media From this encounter Electronic signature on 12/04/2022 9:28 AM - 1 of 3 e-signatures recorded  Scan on 12/04/2022 9:28 AM by ZWonda AmisBilling    Patient Details  Name: MFinlay MillsMRN: [id number] of Birth: [birth year]Referring Provider:  OBenito Mccreedy MD  Encounter Date: 12/13/2022   SMarcelina Morel DPT 12/13/2022, 11:55 AM  [place] [address]Phone: [phone]  Fax:  [phone]

## 2022-12-14 DIAGNOSIS — M7062 Trochanteric bursitis, left hip: Secondary | ICD-10-CM | POA: Diagnosis not present

## 2022-12-18 ENCOUNTER — Ambulatory Visit: Payer: Medicare Other | Admitting: Physical Therapy

## 2022-12-18 DIAGNOSIS — R252 Cramp and spasm: Secondary | ICD-10-CM

## 2022-12-18 DIAGNOSIS — M6281 Muscle weakness (generalized): Secondary | ICD-10-CM

## 2022-12-18 DIAGNOSIS — M542 Cervicalgia: Secondary | ICD-10-CM | POA: Diagnosis not present

## 2022-12-18 DIAGNOSIS — R262 Difficulty in walking, not elsewhere classified: Secondary | ICD-10-CM | POA: Diagnosis not present

## 2022-12-18 DIAGNOSIS — M25511 Pain in right shoulder: Secondary | ICD-10-CM | POA: Diagnosis not present

## 2022-12-18 DIAGNOSIS — M5416 Radiculopathy, lumbar region: Secondary | ICD-10-CM | POA: Diagnosis not present

## 2022-12-18 DIAGNOSIS — G8929 Other chronic pain: Secondary | ICD-10-CM | POA: Diagnosis not present

## 2022-12-18 NOTE — Therapy (Signed)
OUTPATIENT PHYSICAL THERAPY TREATMENT NOTE   Patient Name: Shannon Rosario MRN: [id number] DOB:January 04, 1961, 61 y.o., female Today's Date: 12/18/2022  PCP: see eval REFERRING PROVIDER: see eval  END OF SESSION:   PT End of Session - 12/18/22 1104     Visit Number 4    Date for PT Re-Evaluation 02/26/23    PT Start Time 66    PT Stop Time 1145    PT Time Calculation (min) 45 min              Past Medical History:  Diagnosis Date   Anxiety    Arthritis    Depression    Diabetes mellitus without complication ([place])    Family history of adverse reaction to anesthesia    My Mother had some complications,I dont remember what    Fibromyalgia    GERD (gastroesophageal reflux disease)    Heart murmur    Hypercholesterolemia    Hypertension    Obesity    OSA (obstructive sleep apnea)    TIA (transient ischemic attack) 12/24/2010   Past Surgical History:  Procedure Laterality Date   [place] Left 1995   LEFT HEART CATH AND CORONARY ANGIOGRAPHY N/A 03/16/2021   Procedure: LEFT HEART CATH AND CORONARY ANGIOGRAPHY;  Surgeon: Nigel Mormon, MD;  Location: [facility];  Service: Cardiovascular;  Laterality: N/A;   [place]   Patient Active Problem List   Diagnosis Date Noted   Angina pectoris, unstable ([place]) 08/11/2021   Non-restorative sleep 08/11/2021   Unstable angina ([place]) 03/16/2021   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   Fire ant bite 10/22/2016   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/2016   Allergic urticaria 10/22/2016   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 10/22/2016   Obstructive sleep apnea treated with continuous positive airway pressure (CPAP) 10/22/2016   Chest pain 09/21/2015   Essential hypertension 09/21/2015   Diabetes ([place]) 09/21/2015   Pain in the  chest     REFERRING DIAG: cerv,shld and back  THERAPY DIAG:  Muscle weakness (generalized)  Cramp and spasm  Cervicalgia  Rationale for Evaluation and Treatment Rehabilitation  PERTINENT HISTORY: see note  PRECAUTIONS: none  OBJECTIVE: (objective measures completed at initial evaluation unless otherwise dated)  ONSET DATE: 11/29/22  SUBJECTIVE STATEMENT: R shoulder pain remains her primary issue- work up like a needle was going in me. Estim and ice/meat feels best   PERTINENT HISTORY:  Patient reports she fell in February, sustaining injuries to L hip, lower back, L shoulder, neck. She received an injection in her L shoulder for pain and inflammation on 2/13. No hip injection per Dr decision.    PAIN:  Are you having pain? Yes: NPRS scale: 10/10 Pain location: R shoulder, distal  deltoid Pain description: pressure Aggravating factors: Sometimes raising up above her head, stirring a mixing bowl Relieving factors: Biofreeze   PRECAUTIONS: None   WEIGHT BEARING RESTRICTIONS: No   FALLS:  Has patient fallen in last 6 months? No   LIVING ENVIRONMENT: Lives with: lives with their family Lives in: House/apartment Stairs: Yes: Internal: 14 steps; on left going up and External: 1 steps; none Has following equipment at home: None   OCCUPATION: On disability   PLOF: Independent   PATIENT GOALS: Patient would like to have relief from shoulder pain and improve her ROM to normal.   NEXT MD VISIT:    OBJECTIVE:    DIAGNOSTIC FINDINGS:  Lumbar spine MRI ordered.     SCREENING FOR RED FLAGS: Bowel or bladder incontinence: No Spinal tumors: No Cauda equina syndrome: No Compression fracture: No Abdominal aneurysm: No   COGNITION: Overall cognitive status: Within functional limits for  tasks assessed                          SENSATION: Not tested   POSTURE: decreased thoracic kyphosis   PALPATION: Tight and TTP along B cervical paraspinals, increased around upper traps and medial scapulae, tight pects, tight scalenes, TTP on deltoid insertion. R supraspinatus tendon and biceps tendons both with severe TTP   CERVICAL ROM:    AROM eval 12/18/22  Flexion 100% WFLs- radiating symptoms  Extension 100 Limited 25%-radiating pain   Right lateral flexion 80   Left lateral flexion 80   Right rotation 100 Decreased 50%  Left rotation 100 Dereased 25%   (Blank rows = not tested)   UPPER EXTREMITY ROM:   LUE WNL, R shoulder flexion WNL, but painful, abd to 90 degrees, painful   UPPER EXTREMITY MMT:  LUE WNL, R shoulder MMT limited due to pain with any resistance, elbow WFL     LUMBAR SPECIAL TESTS:  Deferred as patient now denies low back pain after receiving injections SHOULDER TESTS: Hawkins Test: (+) R                                     Painful arc test (+) on R                                    Empty Can Test (+) on R                                     Spurling's test (-) B. GAIT: Distance walked: In clinic distances Assistive device utilized: None Level of assistance: Complete Independence Comments: No obvious gait deviations.   TODAY'S TREATMENT:  DATE:   12/18/22 UBE L 3 2 min each way Seated row and lats 20# 2 sets 10 Cable pulley shld ext 5# 2 sets 10 3# empty can 2 sets 10 3# PNF 2 sets 10 Yellow tband 3 point scap stab BIL 10 each   IFC/MH to RT shld Ionto RT shld 1.2 cc dex    12/13/22 UBE L 1 x 2 min forward/2 min backwards, stopped at 3 min due to post rotation hurting STM to upper traps, pects on R Shoulder joint mobs into inferior and post glide  Manual cervical traction x 60 sec Scapular mobilization into  depression Wall stars- yellow Tband, painful to move R hand, so stabilized with R while moving L hand Wall slides- towel on wall, 10 flex, 10 scaption on R. VASO to R shoulder 10 min, 34 mod pressure Ionto patch to R shoulder 1.2 cc Dextro  12/07/22 UBE L 1 1.5 min fwd/1.5 min backward Yellow tband shld ext,row and ER 10x  Cerv retraction head on ball on wall hold 3 sec 10 x 2# empty can 10 x ER 2# 10x at side and at 90 degrees abd PROM RT shld , jt mobs and capsular stretch IFC/MH to RT shld Ionto RT shld 1.2 cc dex   12/04/22 Education, HEP      PATIENT EDUCATION:  Education details: POC HEP Person educated: Patient Education method: Consulting civil engineer, Media planner, and Handouts Education comprehension: verbalized understanding and returned demonstration   HOME EXERCISE PROGRAM: [id number]   ASSESSMENT:   CLINICAL IMPRESSION:Patient reports continued R shoulder pain and states temp relief with modalities. Pt reports back good since shot. Checked goals and ROM and she is progressing slowly- still limited with pain   OBJECTIVE IMPAIRMENTS: decreased coordination, decreased ROM, decreased strength, hypomobility, increased fascial restrictions, increased muscle spasms, improper body mechanics, postural dysfunction, and pain.    ACTIVITY LIMITATIONS: carrying, lifting, sleeping, reach over head, and caring for others   PARTICIPATION LIMITATIONS: cleaning, laundry, shopping, and community activity   PERSONAL FACTORS: Past/current experiences are also affecting patient's functional outcome.    REHAB POTENTIAL: Good   CLINICAL DECISION MAKING: Evolving/moderate complexity   EVALUATION COMPLEXITY: Moderate     GOALS: Goals reviewed with patient? Yes   SHORT TERM GOALS: Target date: 12/22/22   I with initial HEP Baseline: Goal status: 12/07/22 MET   LONG TERM GOALS: Target date: 02/26/23   I with final HEP Baseline:  Goal status: on going 12/18/22   2.  Decrease shoulder  pain with activity to <4/10 Baseline: 10/10 Goal status: ongoing 12/18/22   3.  Increase R shoulder strength to 4/5, including scapular stabilizers. Baseline: 3/5 Goal status: ongoing 12/18/22   4.  Patient will achieve at least 120 degrees of R shoulder abd, with pain< 4/10 Baseline:  Goal status: 12/18/22 RT shld abd sitting 121 pain 8/10   PLAN:   PT FREQUENCY: 2x/week   PT DURATION: 12 weeks   PLANNED INTERVENTIONS: Therapeutic exercises, Therapeutic activity, Neuromuscular re-education, Balance training, Gait training, Patient/Family education, Self Care, Joint mobilization, Dry Needling, Electrical stimulation, Cryotherapy, Moist heat, Ionotophoresis 28m/ml Dexamethasone, and Manual therapy.   PLAN FOR NEXT SESSION: assess and progress           CSoil scientistAll Conversations on this Encounter Media From this encounter Electronic signature on 12/04/2022 9:28 AM - 1 of 3 e-signatures recorded  Scan on 12/04/2022 9:28 AM by [facility]    Patient Details  Name: MQuida Glasser  MRN: [id number] Date of Birth: 02/03/1961 Referring Provider:  Benito Mccreedy, MD  Encounter Date: 12/18/2022   Marcelina Morel, DPT 12/18/2022, 11:05 AM  [address] [place], [place], [address] Phone: [phone]   Fax:  [place] [place], [place], [address] Phone: [phone]   Fax:  [phone]  Patient Details  Name: Bryndle Corredor MRN: [id number] Date of Birth: 10/24/1961 Referring Provider:  Benito Mccreedy, MD  Encounter Date: 12/18/2022   Laqueta Carina, PTA 12/18/2022, 11:05 AM  [address] [place], [place], [address] Phone: [phone]   Fax:  [phone]

## 2022-12-20 ENCOUNTER — Encounter: Payer: Self-pay | Admitting: Physical Therapy

## 2022-12-20 ENCOUNTER — Ambulatory Visit: Payer: Medicare Other | Admitting: Physical Therapy

## 2022-12-20 DIAGNOSIS — R252 Cramp and spasm: Secondary | ICD-10-CM | POA: Diagnosis not present

## 2022-12-20 DIAGNOSIS — M542 Cervicalgia: Secondary | ICD-10-CM

## 2022-12-20 DIAGNOSIS — M6281 Muscle weakness (generalized): Secondary | ICD-10-CM

## 2022-12-20 DIAGNOSIS — G8929 Other chronic pain: Secondary | ICD-10-CM

## 2022-12-20 DIAGNOSIS — M25511 Pain in right shoulder: Secondary | ICD-10-CM | POA: Diagnosis not present

## 2022-12-20 DIAGNOSIS — R262 Difficulty in walking, not elsewhere classified: Secondary | ICD-10-CM

## 2022-12-20 DIAGNOSIS — M5416 Radiculopathy, lumbar region: Secondary | ICD-10-CM | POA: Diagnosis not present

## 2022-12-20 NOTE — Therapy (Signed)
OUTPATIENT PHYSICAL THERAPY TREATMENT NOTE   Patient Name: Shannon Rosario MRN: [id number] DOB:1961-04-05, 61 y.o., female Today's Date: 12/20/2022  PCP: see eval REFERRING PROVIDER: see eval  END OF SESSION:   PT End of Session - 12/20/22 1103     Visit Number 5    Date for PT Re-Evaluation 02/26/23    PT Start Time 1058    PT Stop Time 1140    PT Time Calculation (min) 42 min    Activity Tolerance Patient tolerated treatment well    Behavior During Therapy WFL for tasks assessed/performed               Past Medical History:  Diagnosis Date   Anxiety    Arthritis    Depression    Diabetes mellitus without complication ([place])    Family history of adverse reaction to anesthesia    My Mother had some complications,I dont remember what    Fibromyalgia    GERD (gastroesophageal reflux disease)    Heart murmur    Hypercholesterolemia    Hypertension    Obesity    OSA (obstructive sleep apnea)    TIA (transient ischemic attack) 12/24/2010   Past Surgical History:  Procedure Laterality Date   APPENDECTOMY     [place] Left 1995   LEFT HEART CATH AND CORONARY ANGIOGRAPHY N/A 03/16/2021   Procedure: LEFT HEART CATH AND CORONARY ANGIOGRAPHY;  Surgeon: Nigel Mormon, Rosario;  Location: [facility];  Service: Cardiovascular;  Laterality: N/A;   [place]   Patient Active Problem List   Diagnosis Date Noted   Angina pectoris, unstable ([place]) 08/11/2021   Non-restorative sleep 08/11/2021   Unstable angina ([place]) 03/16/2021   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   Fire ant bite 10/22/2016   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/2016   Allergic urticaria 10/22/2016   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 10/22/2016   Obstructive sleep apnea treated with continuous positive airway pressure (CPAP)  10/22/2016   Chest pain 09/21/2015   Essential hypertension 09/21/2015   Diabetes ([place]) 09/21/2015   Pain in the chest     REFERRING DIAG: cerv,shld and back  THERAPY DIAG:  Muscle weakness (generalized)  Cramp and spasm  Cervicalgia  Chronic right shoulder pain  Difficulty in walking, not elsewhere classified  Lumbar radiculopathy  Rationale for Evaluation and Treatment Rehabilitation  PERTINENT HISTORY: see note  PRECAUTIONS: none  OBJECTIVE: (objective measures completed at initial evaluation unless otherwise dated)  ONSET DATE: 11/29/22  SUBJECTIVE STATEMENT: Patient reports continued R shoulder pain. She reports clicking and popping in the shoulder which started a couple of days ago.   PERTINENT HISTORY:  Patient reports she fell in February, sustaining injuries to L hip, lower back, L shoulder, neck. She received an injection in her L shoulder for pain and inflammation on 2/13. No hip injection per Dr decision.    PAIN:  Are you having pain? Yes: NPRS scale: 10/10 Pain location: R shoulder, distal  deltoid Pain description: pressure Aggravating factors: Sometimes raising up above her head, stirring a mixing bowl Relieving factors: Biofreeze   PRECAUTIONS: None   WEIGHT BEARING RESTRICTIONS: No   FALLS:  Has patient fallen in last 6 months? No   LIVING ENVIRONMENT: Lives with: lives with their family Lives in: House/apartment Stairs: Yes: Internal: 14 steps; on left going up and External: 1 steps; none Has following equipment at home: None   OCCUPATION: On disability   PLOF: Independent   PATIENT GOALS: Patient would like to have relief from shoulder pain and improve her ROM to normal.   NEXT Rosario VISIT:    OBJECTIVE:    DIAGNOSTIC FINDINGS:  Lumbar spine MRI  ordered.     SCREENING FOR RED FLAGS: Bowel or bladder incontinence: No Spinal tumors: No Cauda equina syndrome: No Compression fracture: No Abdominal aneurysm: No   COGNITION: Overall cognitive status: Within functional limits for tasks assessed                          SENSATION: Not tested   POSTURE: decreased thoracic kyphosis   PALPATION: Tight and TTP along B cervical paraspinals, increased around upper traps and medial scapulae, tight pects, tight scalenes, TTP on deltoid insertion. R supraspinatus tendon and biceps tendons both with severe TTP   CERVICAL ROM:    AROM eval 12/18/22  Flexion 100% WFLs- radiating symptoms  Extension 100 Limited 25%-radiating pain   Right lateral flexion 80   Left lateral flexion 80   Right rotation 100 Decreased 50%  Left rotation 100 Dereased 25%   (Blank rows = not tested)   UPPER EXTREMITY ROM:   LUE WNL, R shoulder flexion WNL, but painful, abd to 90 degrees, painful   UPPER EXTREMITY MMT:  LUE WNL, R shoulder MMT limited due to pain with any resistance, elbow WFL     LUMBAR SPECIAL TESTS:  Deferred as patient now denies low back pain after receiving injections SHOULDER TESTS: Hawkins Test: (+) R                                     Painful arc test (+) on R                                    Empty Can Test (+) on R                                     Spurling's test (-) B. GAIT: Distance walked: In clinic distances Assistive device utilized: None Level of assistance: Complete Independence Comments: No obvious gait deviations.   TODAY'S TREATMENT:  DATE:  12/20/22 UBE L3 x 2.5 min each way STM to R pects, supraspinatus F/B stretch R humerus joint mobilization inferior and post glide Patient led stretch including holding doorframe with ER and doorway stretch for pects. Shoulder/scap strengthening,  wall slides- flexion x 10, tried scaption, but caused impingement pain, so deferred. Wall stars against red tband resistance, 5 reps each arm Seated row with 15# resistance, 2 x 10 reps. Vaso to R shoulder med pressure, 34 degrees, 10 minutes Ionto patch to R shoulder, 1.2 cc Dextro  12/18/22 UBE L 3 2 min each way Seated row and lats 20# 2 sets 10 Cable pulley shld ext 5# 2 sets 10 3# empty can 2 sets 10 3# PNF 2 sets 10 Yellow tband 3 point scap stab BIL 10 each IFC/MH to RT shld Ionto RT shld 1.2 cc dex    12/13/22 UBE L 1 x 2 min forward/2 min backwards, stopped at 3 min due to post rotation hurting STM to upper traps, pects on R Shoulder joint mobs into inferior and post glide  Manual cervical traction x 60 sec Scapular mobilization into depression Wall stars- yellow Tband, painful to move R hand, so stabilized with R while moving L hand Wall slides- towel on wall, 10 flex, 10 scaption on R. VASO to R shoulder 10 min, 34 mod pressure Ionto patch to R shoulder 1.2 cc Dextro  12/07/22 UBE L 1 1.5 min fwd/1.5 min backward Yellow tband shld ext,row and ER 10x  Cerv retraction head on ball on wall hold 3 sec 10 x 2# empty can 10 x ER 2# 10x at side and at 90 degrees abd PROM RT shld , jt mobs and capsular stretch IFC/MH to RT shld Ionto RT shld 1.2 cc dex   12/04/22 Education, HEP      PATIENT EDUCATION:  Education details: POC HEP Person educated: Patient Education method: Consulting civil engineer, Media planner, and Handouts Education comprehension: verbalized understanding and returned demonstration   HOME EXERCISE PROGRAM: [id number]   ASSESSMENT:   CLINICAL IMPRESSION:Patient reports continued R shoulder pain, as well as some clicking in the shoulder. She has decreased inf glide of humerus and also severely tight pects. Provided joint mobs, deep STM and stretch to pects, cross friction to supraspinatus tendon followed by therapist led PROM and self stretch, then scapular  strengthening and stability. Finished with Vaso and Ionto patch for inflammation and pain control.   OBJECTIVE IMPAIRMENTS: decreased coordination, decreased ROM, decreased strength, hypomobility, increased fascial restrictions, increased muscle spasms, improper body mechanics, postural dysfunction, and pain.    ACTIVITY LIMITATIONS: carrying, lifting, sleeping, reach over head, and caring for others   PARTICIPATION LIMITATIONS: cleaning, laundry, shopping, and community activity   PERSONAL FACTORS: Past/current experiences are also affecting patient's functional outcome.    REHAB POTENTIAL: Good   CLINICAL DECISION MAKING: Evolving/moderate complexity   EVALUATION COMPLEXITY: Moderate     GOALS: Goals reviewed with patient? Yes   SHORT TERM GOALS: Target date: 12/22/22   I with initial HEP Baseline: Goal status: 12/07/22 MET   LONG TERM GOALS: Target date: 02/26/23   I with final HEP Baseline:  Goal status: on going 12/18/22   2.  Decrease shoulder pain with activity to <4/10 Baseline: 10/10 Goal status: ongoing 12/18/22   3.  Increase R shoulder strength to 4/5, including scapular stabilizers. Baseline: 3/5 Goal status: ongoing 12/18/22   4.  Patient will achieve at least 120 degrees of R shoulder abd, with pain< 4/10 Baseline:  Goal status:  12/18/22 RT shld abd sitting 121 pain 8/10   PLAN:   PT FREQUENCY: 2x/week   PT DURATION: 12 weeks   PLANNED INTERVENTIONS: Therapeutic exercises, Therapeutic activity, Neuromuscular re-education, Balance training, Gait training, Patient/Family education, Self Care, Joint mobilization, Dry Needling, Electrical stimulation, Cryotherapy, Moist heat, Ionotophoresis 90m/ml Dexamethasone, and Manual therapy.   PLAN FOR NEXT SESSION: assess and progress           Patient Details  Name: Shannon Rosario, Shannon Rosario  Encounter Date:  12/20/2022   SMarcelina Morel DPT 12/20/2022, 11:40 AM  [place] [address]Phone: [phone]  Fax:  [address] [place] [address]Phone: [phone]  Fax:  [phone]

## 2022-12-25 ENCOUNTER — Ambulatory Visit: Payer: 59 | Attending: Physical Medicine and Rehabilitation | Admitting: Physical Therapy

## 2022-12-25 DIAGNOSIS — G8929 Other chronic pain: Secondary | ICD-10-CM | POA: Diagnosis not present

## 2022-12-25 DIAGNOSIS — R262 Difficulty in walking, not elsewhere classified: Secondary | ICD-10-CM | POA: Insufficient documentation

## 2022-12-25 DIAGNOSIS — R252 Cramp and spasm: Secondary | ICD-10-CM | POA: Diagnosis not present

## 2022-12-25 DIAGNOSIS — M542 Cervicalgia: Secondary | ICD-10-CM | POA: Diagnosis not present

## 2022-12-25 DIAGNOSIS — M6281 Muscle weakness (generalized): Secondary | ICD-10-CM | POA: Diagnosis not present

## 2022-12-25 DIAGNOSIS — M25511 Pain in right shoulder: Secondary | ICD-10-CM | POA: Insufficient documentation

## 2022-12-25 NOTE — Therapy (Signed)
OUTPATIENT PHYSICAL THERAPY TREATMENT NOTE   Patient Name: Shannon Rosario MRN: [id number] DOB:12-May-1961, 62 y.o., female Today's Date: 12/25/2022  PCP: see eval REFERRING PROVIDER: see eval  END OF SESSION:   PT End of Session - 12/25/22 1105     Visit Number 6    Date for PT Re-Evaluation 02/26/23    PT Start Time 1105    PT Stop Time 1145    PT Time Calculation (min) 40 min               Past Medical History:  Diagnosis Date   Anxiety    Arthritis    Depression    Diabetes mellitus without complication ([place])    Family history of adverse reaction to anesthesia    My Mother had some complications,I dont remember what    Fibromyalgia    GERD (gastroesophageal reflux disease)    Heart murmur    Hypercholesterolemia    Hypertension    Obesity    OSA (obstructive sleep apnea)    TIA (transient ischemic attack) 12/24/2010   Past Surgical History:  Procedure Laterality Date   [place] Left 1995   LEFT HEART CATH AND CORONARY ANGIOGRAPHY N/A 03/16/2021   Procedure: LEFT HEART CATH AND CORONARY ANGIOGRAPHY;  Surgeon: Nigel Mormon, MD;  Location: [facility];  Service: Cardiovascular;  Laterality: N/A;   [place]   Patient Active Problem List   Diagnosis Date Noted   Angina pectoris, unstable ([place]) 08/11/2021   Non-restorative sleep 08/11/2021   Unstable angina ([place]) 03/16/2021   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   Fire ant bite 10/22/2016   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/2016   Allergic urticaria 10/22/2016   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 10/22/2016   Obstructive sleep apnea treated with continuous positive airway pressure (CPAP) 10/22/2016   Chest pain 09/21/2015   Essential hypertension 09/21/2015   Diabetes ([place]) 09/21/2015   Pain in the  chest     REFERRING DIAG: cerv,shld and back  THERAPY DIAG:  Muscle weakness (generalized)  Cervicalgia  Chronic right shoulder pain  Rationale for Evaluation and Treatment Rehabilitation  PERTINENT HISTORY: see note  PRECAUTIONS: none  OBJECTIVE: (objective measures completed at initial evaluation unless otherwise dated)  ONSET DATE: 11/29/22  SUBJECTIVE STATEMENT: pt arrives stated she is good but shld is not- no lasting changes with PT . Heavy feeling in arm if I use it alot PERTINENT HISTORY:  Patient reports she fell in February, sustaining injuries to L hip, lower back, L shoulder, neck. She received an injection in her L shoulder for pain and inflammation on 2/13. No hip injection per Dr decision.    PAIN:  Are you having pain? Yes: NPRS scale: 7-8/10 Pain location: R shoulder, distal  deltoid Pain description: pressure Aggravating factors: Sometimes raising up above her head, stirring a mixing bowl Relieving factors: Biofreeze   PRECAUTIONS: None   WEIGHT BEARING RESTRICTIONS: No   FALLS:  Has patient fallen in last 6 months? No   LIVING ENVIRONMENT: Lives with: lives with their family Lives in: House/apartment Stairs: Yes: Internal: 14 steps; on left going up and External: 1 steps; none Has following equipment at home: None   OCCUPATION: On disability   PLOF: Independent   PATIENT GOALS: Patient would like to have relief from shoulder pain and improve her ROM to normal.   NEXT MD VISIT:    OBJECTIVE:    DIAGNOSTIC FINDINGS:  Lumbar spine MRI ordered.     SCREENING FOR RED FLAGS: Bowel or bladder incontinence: No Spinal tumors: No Cauda equina syndrome: No Compression fracture: No Abdominal aneurysm: No   COGNITION: Overall cognitive status: Within functional  limits for tasks assessed                          SENSATION: Not tested   POSTURE: decreased thoracic kyphosis   PALPATION: Tight and TTP along B cervical paraspinals, increased around upper traps and medial scapulae, tight pects, tight scalenes, TTP on deltoid insertion. R supraspinatus tendon and biceps tendons both with severe TTP   CERVICAL ROM:    AROM eval 12/18/22  Flexion 100% WFLs- radiating symptoms  Extension 100 Limited 25%-radiating pain   Right lateral flexion 80   Left lateral flexion 80   Right rotation 100 Decreased 50%  Left rotation 100 Dereased 25%   (Blank rows = not tested)   UPPER EXTREMITY ROM:   LUE WNL, R shoulder flexion WNL, but painful, abd to 90 degrees, painful   UPPER EXTREMITY MMT:  LUE WNL, R shoulder MMT limited due to pain with any resistance, elbow WFL     LUMBAR SPECIAL TESTS:  Deferred as patient now denies low back pain after receiving injections SHOULDER TESTS: Hawkins Test: (+) R                                     Painful arc test (+) on R                                    Empty Can Test (+) on R                                     Spurling's test (-) B. GAIT: Distance walked: In clinic distances Assistive device utilized: None Level of assistance: Complete Independence Comments: No obvious gait deviations.   TODAY'S TREATMENT:  DATE:   12/25/22 UBE L 3 3 min fwd/3 min back RT shld joint mobs and capsule stretch. PROM FULL. AROM 160 degrees flexion, ABD 110 Red tband shld ext,row, abd and ER 15 x each Standing back to wall flex flex,abd, horz abd 10 x 2# each 2# PNF and empty can 2# 15x each Red tband 3 pt stab on wall 10 BIL Finger ladder 5 x flex and abd with eccentric lowering MH/IFC cerv and rt shld 10 min in sitting at pt request Stopped ionto as she had 4 patches with NE  12/20/22 UBE L3 x 2.5  min each way STM to R pects, supraspinatus F/B stretch R humerus joint mobilization inferior and post glide Patient led stretch including holding doorframe with ER and doorway stretch for pects. Shoulder/scap strengthening, wall slides- flexion x 10, tried scaption, but caused impingement pain, so deferred. Wall stars against red tband resistance, 5 reps each arm Seated row with 15# resistance, 2 x 10 reps. Vaso to R shoulder med pressure, 34 degrees, 10 minutes Ionto patch to R shoulder, 1.2 cc Dextro  12/18/22 UBE L 3 2 min each way Seated row and lats 20# 2 sets 10 Cable pulley shld ext 5# 2 sets 10 3# empty can 2 sets 10 3# PNF 2 sets 10 Yellow tband 3 point scap stab BIL 10 each IFC/MH to RT shld Ionto RT shld 1.2 cc dex    12/13/22 UBE L 1 x 2 min forward/2 min backwards, stopped at 3 min due to post rotation hurting STM to upper traps, pects on R Shoulder joint mobs into inferior and post glide  Manual cervical traction x 60 sec Scapular mobilization into depression Wall stars- yellow Tband, painful to move R hand, so stabilized with R while moving L hand Wall slides- towel on wall, 10 flex, 10 scaption on R. VASO to R shoulder 10 min, 34 mod pressure Ionto patch to R shoulder 1.2 cc Dextro  12/07/22 UBE L 1 1.5 min fwd/1.5 min backward Yellow tband shld ext,row and ER 10x  Cerv retraction head on ball on wall hold 3 sec 10 x 2# empty can 10 x ER 2# 10x at side and at 90 degrees abd PROM RT shld , jt mobs and capsular stretch IFC/MH to RT shld Ionto RT shld 1.2 cc dex   12/04/22 Education, HEP      PATIENT EDUCATION:  Education details: POC HEP Person educated: Patient Education method: Consulting civil engineer, Media planner, and Handouts Education comprehension: verbalized understanding and returned demonstration   HOME EXERCISE PROGRAM: [id number]   ASSESSMENT:   CLINICAL IMPRESSION:pt arrives with continued pain in RT shld and no effects with any therapy  interventions, discussed with need to possibly need to go back to MD for f/u . Assessed LTG for ROM and MMT. Pt does fatigue easily with ex and increased pain noted. OBJECTIVE IMPAIRMENTS: decreased coordination, decreased ROM, decreased strength, hypomobility, increased fascial restrictions, increased muscle spasms, improper body mechanics, postural dysfunction, and pain.    ACTIVITY LIMITATIONS: carrying, lifting, sleeping, reach over head, and caring for others   PARTICIPATION LIMITATIONS: cleaning, laundry, shopping, and community activity   PERSONAL FACTORS: Past/current experiences are also affecting patient's functional outcome.    REHAB POTENTIAL: Good   CLINICAL DECISION MAKING: Evolving/moderate complexity   EVALUATION COMPLEXITY: Moderate     GOALS: Goals reviewed with patient? Yes   SHORT TERM GOALS: Target date: 12/22/22   I with initial HEP Baseline: Goal status: 12/07/22 MET   LONG TERM  GOALS: Target date: 02/26/23   I with final HEP Baseline:  Goal status: on going 12/18/22   2.  Decrease shoulder pain with activity to <4/10 Baseline: 10/10 Goal status: ongoing 12/18/22   3.  Increase R shoulder strength to 4/5, including scapular stabilizers. Baseline: 3/5 Goal status: ongoing 12/18/22. 12/25/22 ongoing fatigues quickly.   4.  Patient will achieve at least 120 degrees of R shoulder abd, with pain< 4/10 Baseline:  Goal status: 12/18/22 RT shld abd sitting 121 pain 8/10  12/25/22 abd 110 with increased pain   PLAN:   PT FREQUENCY: 2x/week   PT DURATION: 12 weeks   PLANNED INTERVENTIONS: Therapeutic exercises, Therapeutic activity, Neuromuscular re-education, Balance training, Gait training, Patient/Family education, Self Care, Joint mobilization, Dry Needling, Electrical stimulation, Cryotherapy, Moist heat, Ionotophoresis 45m/ml Dexamethasone, and Manual therapy.   PLAN FOR NEXT SESSION: improved shld AROM but still very painful and fatigued. ? Could arm  pain be coming from neck??? Need to check           Patient Details  Name: Shannon PatchenMRN: [id number] of Birth: [birth year]Referring Provider:  OBenito Mccreedy MD  Encounter Date: 12/25/2022   AFranki MontePTA 12/25/2022, 11:05 AM  [place] [address]Phone: [phone]  Fax:  [address] [address]Phone: [phone]  Fax:  [address] [address]Phone: [phone]  Fax:  [phone]

## 2022-12-27 ENCOUNTER — Ambulatory Visit: Payer: 59 | Admitting: Physical Therapy

## 2023-01-01 ENCOUNTER — Encounter: Payer: Self-pay | Admitting: Physical Therapy

## 2023-01-01 ENCOUNTER — Ambulatory Visit: Payer: 59 | Admitting: Physical Therapy

## 2023-01-01 DIAGNOSIS — G8929 Other chronic pain: Secondary | ICD-10-CM

## 2023-01-01 DIAGNOSIS — R262 Difficulty in walking, not elsewhere classified: Secondary | ICD-10-CM | POA: Diagnosis not present

## 2023-01-01 DIAGNOSIS — M25511 Pain in right shoulder: Secondary | ICD-10-CM | POA: Diagnosis not present

## 2023-01-01 DIAGNOSIS — R252 Cramp and spasm: Secondary | ICD-10-CM | POA: Diagnosis not present

## 2023-01-01 DIAGNOSIS — M542 Cervicalgia: Secondary | ICD-10-CM

## 2023-01-01 DIAGNOSIS — M6281 Muscle weakness (generalized): Secondary | ICD-10-CM | POA: Diagnosis not present

## 2023-01-01 NOTE — Therapy (Signed)
OUTPATIENT PHYSICAL THERAPY TREATMENT NOTE   Patient Name: Shannon Rosario MRN: [id number] DOB:October 31, 1961, 62 y.o., female Today's Date: 01/01/2023  PCP: see eval REFERRING PROVIDER: see eval  END OF SESSION:   PT End of Session - 01/01/23 1435     Visit Number 7    Date for PT Re-Evaluation 02/26/23    PT Start Time 1435    PT Stop Time 1515    PT Time Calculation (min) 40 min    Activity Tolerance Patient tolerated treatment well    Behavior During Therapy WFL for tasks assessed/performed               Past Medical History:  Diagnosis Date   Anxiety    Arthritis    Depression    Diabetes mellitus without complication ([place])    Family history of adverse reaction to anesthesia    My Mother had some complications,I dont remember what    Fibromyalgia    GERD (gastroesophageal reflux disease)    Heart murmur    Hypercholesterolemia    Hypertension    Obesity    OSA (obstructive sleep apnea)    TIA (transient ischemic attack) 12/24/2010   Past Surgical History:  Procedure Laterality Date   [place] Left 1995   LEFT HEART CATH AND CORONARY ANGIOGRAPHY N/A 03/16/2021   Procedure: LEFT HEART CATH AND CORONARY ANGIOGRAPHY;  Surgeon: Nigel Mormon, MD;  Location: [facility];  Service: Cardiovascular;  Laterality: N/A;   [place]   Patient Active Problem List   Diagnosis Date Noted   Angina pectoris, unstable ([place]) 08/11/2021   Non-restorative sleep 08/11/2021   Unstable angina ([place]) 03/16/2021   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   Fire ant bite 10/22/2016   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/2016   Allergic urticaria 10/22/2016   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 10/22/2016   Obstructive sleep apnea treated with continuous positive airway pressure (CPAP)  10/22/2016   Chest pain 09/21/2015   Essential hypertension 09/21/2015   Diabetes ([place]) 09/21/2015   Pain in the chest     REFERRING DIAG: cerv,shld and back  THERAPY DIAG:  Muscle weakness (generalized)  Cervicalgia  Chronic right shoulder pain  Rationale for Evaluation and Treatment Rehabilitation  PERTINENT HISTORY: see note  PRECAUTIONS: none  OBJECTIVE: (objective measures completed at initial evaluation unless otherwise dated)  ONSET DATE: 11/29/22  SUBJECTIVE STATEMENT: Shoulder feels like its rolled forward, does not hurt as bad as it been.  PERTINENT HISTORY:  Patient reports she fell in February, sustaining injuries to L hip, lower back, L shoulder, neck. She received an injection in her L shoulder for pain and inflammation on 2/13. No hip injection per Dr decision.    PAIN:  Are you having pain? Yes: NPRS scale: 4/10 Pain location: R shoulder, distal  deltoid Pain description: pressure Aggravating factors: Sometimes raising up above her head, stirring a mixing bowl Relieving factors: Biofreeze   PRECAUTIONS: None   WEIGHT BEARING RESTRICTIONS: No   FALLS:  Has patient fallen in last 6 months? No   LIVING ENVIRONMENT: Lives with: lives with their family Lives in: House/apartment Stairs: Yes: Internal: 14 steps; on left going up and External: 1 steps; none Has following equipment at home: None   OCCUPATION: On disability   PLOF: Independent   PATIENT GOALS: Patient would like to have relief from shoulder pain and improve her ROM to normal.   NEXT MD VISIT:    OBJECTIVE:    DIAGNOSTIC FINDINGS:  Lumbar spine MRI ordered.     SCREENING FOR RED FLAGS: Bowel or bladder incontinence: No Spinal tumors: No Cauda equina syndrome: No Compression fracture:  No Abdominal aneurysm: No   COGNITION: Overall cognitive status: Within functional limits for tasks assessed                          SENSATION: Not tested   POSTURE: decreased thoracic kyphosis   PALPATION: Tight and TTP along B cervical paraspinals, increased around upper traps and medial scapulae, tight pects, tight scalenes, TTP on deltoid insertion. R supraspinatus tendon and biceps tendons both with severe TTP   CERVICAL ROM:    AROM eval 12/18/22  Flexion 100% WFLs- radiating symptoms  Extension 100 Limited 25%-radiating pain   Right lateral flexion 80   Left lateral flexion 80   Right rotation 100 Decreased 50%  Left rotation 100 Dereased 25%   (Blank rows = not tested)   UPPER EXTREMITY ROM:   LUE WNL, R shoulder flexion WNL, but painful, abd to 90 degrees, painful   UPPER EXTREMITY MMT:  LUE WNL, R shoulder MMT limited due to pain with any resistance, elbow WFL     LUMBAR SPECIAL TESTS:  Deferred as patient now denies low back pain after receiving injections SHOULDER TESTS: Hawkins Test: (+) R                                     Painful arc test (+) on R                                    Empty Can Test (+) on R                                     Spurling's test (-) B. GAIT: Distance walked: In clinic distances Assistive device utilized: None Level of assistance: Complete Independence Comments: No obvious gait deviations.   TODAY'S TREATMENT:  DATE:  01/01/23 UBE L2 x 3 min each AAROM  2lb WaTE flex, Ext, IR  RUE ER yellow x10, Red x10 RUE IR red 2x12 Shoulder Flex 2lb 2x10 Shoulder abd 2lb 2x10 MH/IFC cerv and rt shld 10 min in sitting at pt request  12/25/22 UBE L 3 3 min fwd/3 min back RT shld joint mobs and capsule stretch. PROM FULL. AROM 160 degrees flexion, ABD 110 Red tband shld ext,row, abd and ER 15 x each Standing back  to wall flex flex,abd, horz abd 10 x 2# each 2# PNF and empty can 2# 15x each Red tband 3 pt stab on wall 10 BIL Finger ladder 5 x flex and abd with eccentric lowering MH/IFC cerv and rt shld 10 min in sitting at pt request Stopped ionto as she had 4 patches with NE  12/20/22 UBE L3 x 2.5 min each way STM to R pects, supraspinatus F/B stretch R humerus joint mobilization inferior and post glide Patient led stretch including holding doorframe with ER and doorway stretch for pects. Shoulder/scap strengthening, wall slides- flexion x 10, tried scaption, but caused impingement pain, so deferred. Wall stars against red tband resistance, 5 reps each arm Seated row with 15# resistance, 2 x 10 reps. Vaso to R shoulder med pressure, 34 degrees, 10 minutes Ionto patch to R shoulder, 1.2 cc Dextro  12/18/22 UBE L 3 2 min each way Seated row and lats 20# 2 sets 10 Cable pulley shld ext 5# 2 sets 10 3# empty can 2 sets 10 3# PNF 2 sets 10 Yellow tband 3 point scap stab BIL 10 each IFC/MH to RT shld Ionto RT shld 1.2 cc dex    12/13/22 UBE L 1 x 2 min forward/2 min backwards, stopped at 3 min due to post rotation hurting STM to upper traps, pects on R Shoulder joint mobs into inferior and post glide  Manual cervical traction x 60 sec Scapular mobilization into depression Wall stars- yellow Tband, painful to move R hand, so stabilized with R while moving L hand Wall slides- towel on wall, 10 flex, 10 scaption on R. VASO to R shoulder 10 min, 34 mod pressure Ionto patch to R shoulder 1.2 cc Dextro  12/07/22 UBE L 1 1.5 min fwd/1.5 min backward Yellow tband shld ext,row and ER 10x  Cerv retraction head on ball on wall hold 3 sec 10 x 2# empty can 10 x ER 2# 10x at side and at 90 degrees abd PROM RT shld , jt mobs and capsular stretch IFC/MH to RT shld Ionto RT shld 1.2 cc dex   12/04/22 Education, HEP      PATIENT EDUCATION:  Education details: POC HEP Person educated:  Patient Education method: Consulting civil engineer, Media planner, and Handouts Education comprehension: verbalized understanding and returned demonstration   HOME EXERCISE PROGRAM: [id number]   ASSESSMENT:   CLINICAL IMPRESSION: pt arrives with reports of improved ROM but with some discomfort. She did report a sharp pain with shoulder abduction. Burning sensation reported with internal and external rotation. Modalities for pain  OBJECTIVE IMPAIRMENTS: decreased coordination, decreased ROM, decreased strength, hypomobility, increased fascial restrictions, increased muscle spasms, improper body mechanics, postural dysfunction, and pain.    ACTIVITY LIMITATIONS: carrying, lifting, sleeping, reach over head, and caring for others   PARTICIPATION LIMITATIONS: cleaning, laundry, shopping, and community activity   PERSONAL FACTORS: Past/current experiences are also affecting patient's functional outcome.    REHAB POTENTIAL: Good   CLINICAL DECISION MAKING: Evolving/moderate complexity   EVALUATION COMPLEXITY: Moderate  GOALS: Goals reviewed with patient? Yes   SHORT TERM GOALS: Target date: 12/22/22   I with initial HEP Baseline: Goal status: 12/07/22 MET   LONG TERM GOALS: Target date: 02/26/23   I with final HEP Baseline:  Goal status: on going 12/18/22   2.  Decrease shoulder pain with activity to <4/10 Baseline: 10/10 Goal status: ongoing 12/18/22   3.  Increase R shoulder strength to 4/5, including scapular stabilizers. Baseline: 3/5 Goal status: ongoing 12/18/22. 12/25/22 ongoing fatigues quickly.   4.  Patient will achieve at least 120 degrees of R shoulder abd, with pain< 4/10 Baseline:  Goal status: 12/18/22 RT shld abd sitting 121 pain 8/10  12/25/22 abd 110 with increased pain   PLAN:   PT FREQUENCY: 2x/week   PT DURATION: 12 weeks   PLANNED INTERVENTIONS: Therapeutic exercises, Therapeutic activity, Neuromuscular re-education, Balance training, Gait training,  Patient/Family education, Self Care, Joint mobilization, Dry Needling, Electrical stimulation, Cryotherapy, Moist heat, Ionotophoresis '4mg'$ /ml Dexamethasone, and Manual therapy.   PLAN FOR NEXT SESSION: improved shld AROM but still very painful and fatigued. ? Could arm pain be coming from neck??? Need to check           Patient Details  Name: Keosha Rossa MRN: [id number] Date of Birth: 07/21/1961 Referring Provider:  Benito Mccreedy, MD  Encounter Date: 01/01/2023   Franki Monte PTA 01/01/2023, 2:36 PM  [address] [place], [place], [address] Phone: [phone]   Fax:  [place] [place], [place], [address] Phone: [phone]   Fax:  [place]. [place], [place], [address] Phone: [phone]   Fax:  [phone]

## 2023-01-03 ENCOUNTER — Ambulatory Visit: Payer: 59 | Admitting: Physical Therapy

## 2023-01-03 ENCOUNTER — Encounter: Payer: Self-pay | Admitting: Physical Therapy

## 2023-01-03 DIAGNOSIS — E1165 Type 2 diabetes mellitus with hyperglycemia: Secondary | ICD-10-CM | POA: Diagnosis not present

## 2023-01-03 DIAGNOSIS — M542 Cervicalgia: Secondary | ICD-10-CM | POA: Diagnosis not present

## 2023-01-03 DIAGNOSIS — M6281 Muscle weakness (generalized): Secondary | ICD-10-CM

## 2023-01-03 DIAGNOSIS — G8929 Other chronic pain: Secondary | ICD-10-CM | POA: Diagnosis not present

## 2023-01-03 DIAGNOSIS — I10 Essential (primary) hypertension: Secondary | ICD-10-CM | POA: Diagnosis not present

## 2023-01-03 DIAGNOSIS — M25511 Pain in right shoulder: Secondary | ICD-10-CM | POA: Diagnosis not present

## 2023-01-03 DIAGNOSIS — E782 Mixed hyperlipidemia: Secondary | ICD-10-CM | POA: Diagnosis not present

## 2023-01-03 DIAGNOSIS — E1142 Type 2 diabetes mellitus with diabetic polyneuropathy: Secondary | ICD-10-CM | POA: Diagnosis not present

## 2023-01-03 DIAGNOSIS — R252 Cramp and spasm: Secondary | ICD-10-CM

## 2023-01-03 DIAGNOSIS — R262 Difficulty in walking, not elsewhere classified: Secondary | ICD-10-CM | POA: Diagnosis not present

## 2023-01-03 NOTE — Therapy (Signed)
OUTPATIENT PHYSICAL THERAPY TREATMENT NOTE   Patient Name: Shannon Rosario MRN: [id number] DOB:11/03/1961, 62 y.o., female Today's Date: 01/03/2023  PCP: see eval REFERRING PROVIDER: see eval  END OF SESSION:       Past Medical History:  Diagnosis Date   Anxiety    Arthritis    Depression    Diabetes mellitus without complication ([place])    Family history of adverse reaction to anesthesia    My Mother had some complications,I dont remember what    Fibromyalgia    GERD (gastroesophageal reflux disease)    Heart murmur    Hypercholesterolemia    Hypertension    Obesity    OSA (obstructive sleep apnea)    TIA (transient ischemic attack) 12/24/2010   Past Surgical History:  Procedure Laterality Date   APPENDECTOMY     CARPAL TUNNEL RELEASE  [place] Left 1995   LEFT HEART CATH AND CORONARY ANGIOGRAPHY N/A 03/16/2021   Procedure: LEFT HEART CATH AND CORONARY ANGIOGRAPHY;  Surgeon: Nigel Mormon, MD;  Location: [facility];  Service: Cardiovascular;  Laterality: N/A;   [place]   Patient Active Problem List   Diagnosis Date Noted   Angina pectoris, unstable ([place]) 08/11/2021   Non-restorative sleep 08/11/2021   Unstable angina ([place]) 03/16/2021   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   Fire ant bite 10/22/2016   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/2016   Allergic urticaria 10/22/2016   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 10/22/2016   Obstructive sleep apnea treated with continuous positive airway pressure (CPAP) 10/22/2016   Chest pain 09/21/2015   Essential hypertension 09/21/2015   Diabetes ([place]) 09/21/2015   Pain in the chest     REFERRING DIAG: cerv,shld and back  THERAPY DIAG:  No diagnosis found.  Rationale for Evaluation and Treatment Rehabilitation  PERTINENT HISTORY: see note  PRECAUTIONS:  none  OBJECTIVE: (objective measures completed at initial evaluation unless otherwise dated)  ONSET DATE: 11/29/22                                                                                                                                                                                          SUBJECTIVE STATEMENT: Shoulder feels like its rolled forward, continues to hurt and be stiff PERTINENT HISTORY:  Patient reports she fell in February, sustaining injuries to L hip, lower back, L shoulder, neck. She received an injection in her L shoulder for pain and inflammation on 2/13. No hip injection per Dr decision.  PAIN:  Are you having pain? Yes: NPRS scale: 4/10 Pain location: R shoulder, distal  deltoid Pain description: pressure Aggravating factors: Sometimes raising up above her head, stirring a mixing bowl Relieving factors: Biofreeze   PRECAUTIONS: None   WEIGHT BEARING RESTRICTIONS: No   FALLS:  Has patient fallen in last 6 months? No   LIVING ENVIRONMENT: Lives with: lives with their family Lives in: House/apartment Stairs: Yes: Internal: 14 steps; on left going up and External: 1 steps; none Has following equipment at home: None   OCCUPATION: On disability   PLOF: Independent   PATIENT GOALS: Patient would like to have relief from shoulder pain and improve her ROM to normal.   NEXT MD VISIT:    OBJECTIVE:    DIAGNOSTIC FINDINGS:  Lumbar spine MRI ordered.     SCREENING FOR RED FLAGS: Bowel or bladder incontinence: No Spinal tumors: No Cauda equina syndrome: No Compression fracture: No Abdominal aneurysm: No   COGNITION: Overall cognitive status: Within functional limits for tasks assessed                          SENSATION: Not tested   POSTURE: decreased thoracic kyphosis   PALPATION: Tight and TTP along B cervical paraspinals, increased around upper traps and medial scapulae, tight pects, tight scalenes, TTP on deltoid insertion. R  supraspinatus tendon and biceps tendons both with severe TTP   CERVICAL ROM:    AROM eval 12/18/22  Flexion 100% WFLs- radiating symptoms  Extension 100 Limited 25%-radiating pain   Right lateral flexion 80   Left lateral flexion 80   Right rotation 100 Decreased 50%  Left rotation 100 Dereased 25%   (Blank rows = not tested)   UPPER EXTREMITY ROM:   LUE WNL, R shoulder flexion WNL, but painful, abd to 90 degrees, painful   UPPER EXTREMITY MMT:  LUE WNL, R shoulder MMT limited due to pain with any resistance, elbow WFL     LUMBAR SPECIAL TESTS:  Deferred as patient now denies low back pain after receiving injections SHOULDER TESTS: Hawkins Test: (+) R                                     Painful arc test (+) on R                                    Empty Can Test (+) on R                                     Spurling's test (-) B. GAIT: Distance walked: In clinic distances Assistive device utilized: None Level of assistance: Complete Independence Comments: No obvious gait deviations.   TODAY'S TREATMENT:  DATE:  01/03/23 Physical Re-assessment due to continued C/O pain STM and joint mobs, inf and post glide, Grade lll 2 x 10 Scapular mobilizations Prone sh ext, T, Y x 6 each with 5 sec hold. Practiced performing correctly, required mod V and min TC to not pull too hard and hike shoulders Doorway stretch for B shoulders, linked to deep breathing to release tension Spurling's neg B.  01/01/23 UBE L2 x 3 min each AAROM  2lb WaTE flex, Ext, IR  RUE ER yellow x10, Red x10 RUE IR red 2x12 Shoulder Flex 2lb 2x10 Shoulder abd 2lb 2x10 MH/IFC cerv and rt shld 10 min in sitting at pt request  12/25/22 UBE L 3 3 min fwd/3 min back RT shld joint mobs and capsule stretch. PROM FULL. AROM 160 degrees flexion, ABD 110 Red tband shld ext,row, abd and ER 15 x  each Standing back to wall flex flex,abd, horz abd 10 x 2# each 2# PNF and empty can 2# 15x each Red tband 3 pt stab on wall 10 BIL Finger ladder 5 x flex and abd with eccentric lowering MH/IFC cerv and rt shld 10 min in sitting at pt request Stopped ionto as she had 4 patches with NE  12/20/22 UBE L3 x 2.5 min each way STM to R pects, supraspinatus F/B stretch R humerus joint mobilization inferior and post glide Patient led stretch including holding doorframe with ER and doorway stretch for pects. Shoulder/scap strengthening, wall slides- flexion x 10, tried scaption, but caused impingement pain, so deferred. Wall stars against red tband resistance, 5 reps each arm Seated row with 15# resistance, 2 x 10 reps. Vaso to R shoulder med pressure, 34 degrees, 10 minutes Ionto patch to R shoulder, 1.2 cc Dextro  12/18/22 UBE L 3 2 min each way Seated row and lats 20# 2 sets 10 Cable pulley shld ext 5# 2 sets 10 3# empty can 2 sets 10 3# PNF 2 sets 10 Yellow tband 3 point scap stab BIL 10 each IFC/MH to RT shld Ionto RT shld 1.2 cc dex    12/13/22 UBE L 1 x 2 min forward/2 min backwards, stopped at 3 min due to post rotation hurting STM to upper traps, pects on R Shoulder joint mobs into inferior and post glide  Manual cervical traction x 60 sec Scapular mobilization into depression Wall stars- yellow Tband, painful to move R hand, so stabilized with R while moving L hand Wall slides- towel on wall, 10 flex, 10 scaption on R. VASO to R shoulder 10 min, 34 mod pressure Ionto patch to R shoulder 1.2 cc Dextro  12/07/22 UBE L 1 1.5 min fwd/1.5 min backward Yellow tband shld ext,row and ER 10x  Cerv retraction head on ball on wall hold 3 sec 10 x 2# empty can 10 x ER 2# 10x at side and at 90 degrees abd PROM RT shld , jt mobs and capsular stretch IFC/MH to RT shld Ionto RT shld 1.2 cc dex   12/04/22 Education, HEP      PATIENT EDUCATION:  Education details: POC  HEP Person educated: Patient Education method: Consulting civil engineer, Media planner, and Handouts Education comprehension: verbalized understanding and returned demonstration   HOME EXERCISE PROGRAM: [id number]   ASSESSMENT:   CLINICAL IMPRESSION: Patient reports continued pain. She has additional stress from being a caregiver, and her upper back muscles are sore and tight. Bra straps create heavy indentions on shoulders due to strain/weight of large breasts. Assessed shoulders and B scapulae. She has  decreased scapular mobility, decreased humeral glides, probably contributing to impingement. Updated Hep to increase scapular stability and to stretch her severely tight pects.  OBJECTIVE IMPAIRMENTS: decreased coordination, decreased ROM, decreased strength, hypomobility, increased fascial restrictions, increased muscle spasms, improper body mechanics, postural dysfunction, and pain.    ACTIVITY LIMITATIONS: carrying, lifting, sleeping, reach over head, and caring for others   PARTICIPATION LIMITATIONS: cleaning, laundry, shopping, and community activity   PERSONAL FACTORS: Past/current experiences are also affecting patient's functional outcome.    REHAB POTENTIAL: Good   CLINICAL DECISION MAKING: Evolving/moderate complexity   EVALUATION COMPLEXITY: Moderate     GOALS: Goals reviewed with patient? Yes   SHORT TERM GOALS: Target date: 12/22/22   I with initial HEP Baseline: Goal status: 12/07/22 MET   LONG TERM GOALS: Target date: 02/26/23   I with final HEP Baseline:  Goal status: on going 12/18/22   2.  Decrease shoulder pain with activity to <4/10 Baseline: 10/10 Goal status: ongoing 12/18/22   3.  Increase R shoulder strength to 4/5, including scapular stabilizers. Baseline: 3/5 Goal status: ongoing 12/18/22. 12/25/22 ongoing fatigues quickly.   4.  Patient will achieve at least 120 degrees of R shoulder abd, with pain< 4/10 Baseline:  Goal status: 12/18/22 RT shld abd sitting  121 pain 8/10  12/25/22 abd 110 with increased pain   PLAN:   PT FREQUENCY: 2x/week   PT DURATION: 12 weeks   PLANNED INTERVENTIONS: Therapeutic exercises, Therapeutic activity, Neuromuscular re-education, Balance training, Gait training, Patient/Family education, Self Care, Joint mobilization, Dry Needling, Electrical stimulation, Cryotherapy, Moist heat, Ionotophoresis '4mg'$ /ml Dexamethasone, and Manual therapy.   PLAN FOR NEXT SESSION: improved shld AROM but still very painful and fatigued. ?            Patient Details  Name: Judit Awad MRN: [id number] Date of Birth: 1961-03-18 Referring Provider:  Benito Mccreedy, MD  Ethel Rana DPT 01/03/23 11:47 AM   [place] [place], [place], [address] Phone: [phone]   Fax:  [phone] Health

## 2023-01-08 ENCOUNTER — Ambulatory Visit: Payer: 59 | Admitting: Physical Therapy

## 2023-01-08 ENCOUNTER — Encounter: Payer: Self-pay | Admitting: Physical Therapy

## 2023-01-08 DIAGNOSIS — R252 Cramp and spasm: Secondary | ICD-10-CM | POA: Diagnosis not present

## 2023-01-08 DIAGNOSIS — G8929 Other chronic pain: Secondary | ICD-10-CM

## 2023-01-08 DIAGNOSIS — M6281 Muscle weakness (generalized): Secondary | ICD-10-CM | POA: Diagnosis not present

## 2023-01-08 DIAGNOSIS — M542 Cervicalgia: Secondary | ICD-10-CM

## 2023-01-08 DIAGNOSIS — R262 Difficulty in walking, not elsewhere classified: Secondary | ICD-10-CM | POA: Diagnosis not present

## 2023-01-08 DIAGNOSIS — M25511 Pain in right shoulder: Secondary | ICD-10-CM | POA: Diagnosis not present

## 2023-01-08 NOTE — Therapy (Signed)
OUTPATIENT PHYSICAL THERAPY TREATMENT NOTE   Patient Name: Shannon Rosario MRN: [id number] DOB:[birth year], 62 y.o., female Today's Date: 01/08/2023  PCP: see eval REFERRING PROVIDER: see eval  END OF SESSION:   PT End of Session - 01/08/23 1433     Visit Number 8    Date for PT Re-Evaluation 02/26/23    PT Start Time 1430    PT Stop Time 1521    PT Time Calculation (min) 51 min    Activity Tolerance Patient tolerated treatment well    Behavior During Therapy WFL for tasks assessed/performed                Past Medical History:  Diagnosis Date   Anxiety    Arthritis    Depression    Diabetes mellitus without complication ([place])    Family history of adverse reaction to anesthesia    My Mother had some complications,I dont remember what    Fibromyalgia    GERD (gastroesophageal reflux disease)    Heart murmur    Hypercholesterolemia    Hypertension    Obesity    OSA (obstructive sleep apnea)    TIA (transient ischemic attack) 12/24/2010   Past Surgical History:  Procedure Laterality Date   APPENDECTOMY     CARPAL [place] Left 1995   LEFT HEART CATH AND CORONARY ANGIOGRAPHY N/A 03/16/2021   Procedure: LEFT HEART CATH AND CORONARY ANGIOGRAPHY;  Surgeon: Nigel Mormon, MD;  Location: [facility];  Service: Cardiovascular;  Laterality: N/A;   [employer]   Patient Active Problem List   Diagnosis Date Noted   Angina pectoris, unstable ([place]) 08/11/2021   Non-restorative sleep 08/11/2021   Unstable angina ([place]) 03/16/2021   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   Fire ant bite 10/22/2016   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/2016   Allergic urticaria 10/22/2016   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 10/22/2016   Obstructive sleep apnea treated with continuous positive airway pressure (CPAP)  10/22/2016   Chest pain 09/21/2015   Essential hypertension 09/21/2015   Diabetes ([place]) 09/21/2015   Pain in the chest     REFERRING DIAG: cerv,shld and back  THERAPY DIAG:  Muscle weakness (generalized)  Chronic right shoulder pain  Cervicalgia  Cramp and spasm  Rationale for Evaluation and Treatment Rehabilitation  PERTINENT HISTORY: see note  PRECAUTIONS: none  OBJECTIVE: (objective measures completed at initial evaluation unless otherwise dated)  ONSET DATE: 11/29/22  SUBJECTIVE STATEMENT: Started taking the mes that the dr gave her and it is feeling better. Had forgot about the med prior PERTINENT HISTORY:  Patient reports she fell in February, sustaining injuries to L hip, lower back, L shoulder, neck. She received an injection in her L shoulder for pain and inflammation on 2/13. No hip injection per Dr decision.    PAIN:  Are you having pain? Yes: NPRS scale: 5/10 Pain location: R shoulder, distal  deltoid Pain description: pressure Aggravating factors: Sometimes raising up above her head, stirring a mixing bowl Relieving factors: Biofreeze   PRECAUTIONS: None   WEIGHT BEARING RESTRICTIONS: No   FALLS:  Has patient fallen in last 6 months? No   LIVING ENVIRONMENT: Lives with: lives with their family Lives in: House/apartment Stairs: Yes: Internal: 14 steps; on left going up and External: 1 steps; none Has following equipment at home: None   OCCUPATION: On disability   PLOF: Independent   PATIENT GOALS: Patient would like to have relief from shoulder pain and improve her ROM to normal.   NEXT MD VISIT:    OBJECTIVE:    DIAGNOSTIC FINDINGS:  Lumbar spine MRI ordered.     SCREENING FOR RED FLAGS: Bowel or bladder incontinence: No Spinal tumors: No Cauda equina  syndrome: No Compression fracture: No Abdominal aneurysm: No   COGNITION: Overall cognitive status: Within functional limits for tasks assessed                          SENSATION: Not tested   POSTURE: decreased thoracic kyphosis   PALPATION: Tight and TTP along B cervical paraspinals, increased around upper traps and medial scapulae, tight pects, tight scalenes, TTP on deltoid insertion. R supraspinatus tendon and biceps tendons both with severe TTP   CERVICAL ROM:    AROM eval 12/18/22  Flexion 100% WFLs- radiating symptoms  Extension 100 Limited 25%-radiating pain   Right lateral flexion 80   Left lateral flexion 80   Right rotation 100 Decreased 50%  Left rotation 100 Dereased 25%   (Blank rows = not tested)   UPPER EXTREMITY ROM:   LUE WNL, R shoulder flexion WNL, but painful, abd to 90 degrees, painful   UPPER EXTREMITY MMT:  LUE WNL, R shoulder MMT limited due to pain with any resistance, elbow WFL     LUMBAR SPECIAL TESTS:  Deferred as patient now denies low back pain after receiving injections SHOULDER TESTS: Hawkins Test: (+) R                                     Painful arc test (+) on R                                    Empty Can Test (+) on R                                     Spurling's test (-) B. GAIT: Distance walked: In clinic distances Assistive device utilized: None Level of assistance: Complete Independence Comments: No obvious gait deviations.   TODAY'S TREATMENT:  DATE:  01/08/23 NuStep L5 x 37mn Shoulder ER yellow 2x15 Shoulder Abd 2lb 2x10 Shouldr flex 3lb 2x10 Standing shoulder Ext 10lb 2x10 Standing shoulder Ext 10lb 2x10 Triceps Ext ext 20lb 2x10 Biceps Curls 10lb 2x10  MHP R shoulder x10 min   01/03/23 Physical Re-assessment due to continued C/O pain STM and joint mobs, inf and post glide, Grade lll 2 x  10 Scapular mobilizations Prone sh ext, T, Y x 6 each with 5 sec hold. Practiced performing correctly, required mod V and min TC to not pull too hard and hike shoulders Doorway stretch for B shoulders, linked to deep breathing to release tension Spurling's neg B.  01/01/23 UBE L2 x 3 min each AAROM  2lb WaTE flex, Ext, IR  RUE ER yellow x10, Red x10 RUE IR red 2x12 Shoulder Flex 2lb 2x10 Shoulder abd 2lb 2x10 MH/IFC cerv and rt shld 10 min in sitting at pt request  12/25/22 UBE L 3 3 min fwd/3 min back RT shld joint mobs and capsule stretch. PROM FULL. AROM 160 degrees flexion, ABD 110 Red tband shld ext,row, abd and ER 15 x each Standing back to wall flex flex,abd, horz abd 10 x 2# each 2# PNF and empty can 2# 15x each Red tband 3 pt stab on wall 10 BIL Finger ladder 5 x flex and abd with eccentric lowering MH/IFC cerv and rt shld 10 min in sitting at pt request Stopped ionto as she had 4 patches with NE  12/20/22 UBE L3 x 2.5 min each way STM to R pects, supraspinatus F/B stretch R humerus joint mobilization inferior and post glide Patient led stretch including holding doorframe with ER and doorway stretch for pects. Shoulder/scap strengthening, wall slides- flexion x 10, tried scaption, but caused impingement pain, so deferred. Wall stars against red tband resistance, 5 reps each arm Seated row with 15# resistance, 2 x 10 reps. Vaso to R shoulder med pressure, 34 degrees, 10 minutes Ionto patch to R shoulder, 1.2 cc Dextro  12/18/22 UBE L 3 2 min each way Seated row and lats 20# 2 sets 10 Cable pulley shld ext 5# 2 sets 10 3# empty can 2 sets 10 3# PNF 2 sets 10 Yellow tband 3 point scap stab BIL 10 each IFC/MH to RT shld Ionto RT shld 1.2 cc dex    12/13/22 UBE L 1 x 2 min forward/2 min backwards, stopped at 3 min due to post rotation hurting STM to upper traps, pects on R Shoulder joint mobs into inferior and post glide  Manual cervical traction x 60 sec Scapular  mobilization into depression Wall stars- yellow Tband, painful to move R hand, so stabilized with R while moving L hand Wall slides- towel on wall, 10 flex, 10 scaption on R. VASO to R shoulder 10 min, 34 mod pressure Ionto patch to R shoulder 1.2 cc Dextro  12/07/22 UBE L 1 1.5 min fwd/1.5 min backward Yellow tband shld ext,row and ER 10x  Cerv retraction head on ball on wall hold 3 sec 10 x 2# empty can 10 x ER 2# 10x at side and at 90 degrees abd PROM RT shld , jt mobs and capsular stretch IFC/MH to RT shld Ionto RT shld 1.2 cc dex   12/04/22 Education, HEP      PATIENT EDUCATION:  Education details: POC HEP Person educated: Patient Education method: EConsulting civil engineer DMedia planner and Handouts Education comprehension: verbalized understanding and returned demonstration   HOME EXERCISE PROGRAM: [id number]   ASSESSMENT:  CLINICAL IMPRESSION: Patient reports continued pain. She has additional stress from being a caregiver, and her upper back muscles are sore and tight. Bra straps create heavy indentions on shoulders due to strain/weight of large breasts. Pt reports improvement since taking the medicine the doctor gave to her. No issues completing today's interventions. Some UE fatigue reported with abduction and flexion.   OBJECTIVE IMPAIRMENTS: decreased coordination, decreased ROM, decreased strength, hypomobility, increased fascial restrictions, increased muscle spasms, improper body mechanics, postural dysfunction, and pain.    ACTIVITY LIMITATIONS: carrying, lifting, sleeping, reach over head, and caring for others   PARTICIPATION LIMITATIONS: cleaning, laundry, shopping, and community activity   PERSONAL FACTORS: Past/current experiences are also affecting patient's functional outcome.    REHAB POTENTIAL: Good   CLINICAL DECISION MAKING: Evolving/moderate complexity   EVALUATION COMPLEXITY: Moderate     GOALS: Goals reviewed with patient? Yes   SHORT TERM GOALS:  Target date: 12/22/22   I with initial HEP Baseline: Goal status: 12/07/22 MET   LONG TERM GOALS: Target date: 02/26/23   I with final HEP Baseline:  Goal status: on going 12/18/22   2.  Decrease shoulder pain with activity to <4/10 Baseline: 10/10 Goal status: ongoing 12/18/22   3.  Increase R shoulder strength to 4/5, including scapular stabilizers. Baseline: 3/5 Goal status: ongoing 12/18/22. 12/25/22 ongoing fatigues quickly.   4.  Patient will achieve at least 120 degrees of R shoulder abd, with pain< 4/10 Baseline:  Goal status: 12/18/22 RT shld abd sitting 121 pain 8/10  12/25/22 abd 110 with increased pain   PLAN:   PT FREQUENCY: 2x/week   PT DURATION: 12 weeks   PLANNED INTERVENTIONS: Therapeutic exercises, Therapeutic activity, Neuromuscular re-education, Balance training, Gait training, Patient/Family education, Self Care, Joint mobilization, Dry Needling, Electrical stimulation, Cryotherapy, Moist heat, Ionotophoresis '4mg'$ /ml Dexamethasone, and Manual therapy.   PLAN FOR NEXT SESSION: improved shld AROM but still very painful and fatigued. ?            Patient Details  Name: Tanijah Morais MRN: [id number] Date of Birth: [birth year] Referring Provider:  Benito Mccreedy, MD  Ethel Rana DPT 01/08/23 3:12 PM   [place]. [place], [place], [address] Phone: [phone]   Fax:  [phone] Health

## 2023-01-10 ENCOUNTER — Ambulatory Visit: Payer: 59 | Admitting: Physical Therapy

## 2023-01-15 ENCOUNTER — Encounter: Payer: Self-pay | Admitting: Physical Therapy

## 2023-01-15 ENCOUNTER — Ambulatory Visit: Payer: 59 | Admitting: Physical Therapy

## 2023-01-15 DIAGNOSIS — M25511 Pain in right shoulder: Secondary | ICD-10-CM | POA: Diagnosis not present

## 2023-01-15 DIAGNOSIS — R252 Cramp and spasm: Secondary | ICD-10-CM | POA: Diagnosis not present

## 2023-01-15 DIAGNOSIS — G8929 Other chronic pain: Secondary | ICD-10-CM

## 2023-01-15 DIAGNOSIS — R262 Difficulty in walking, not elsewhere classified: Secondary | ICD-10-CM | POA: Diagnosis not present

## 2023-01-15 DIAGNOSIS — M542 Cervicalgia: Secondary | ICD-10-CM

## 2023-01-15 DIAGNOSIS — M6281 Muscle weakness (generalized): Secondary | ICD-10-CM

## 2023-01-15 NOTE — Therapy (Signed)
OUTPATIENT PHYSICAL THERAPY TREATMENT NOTE   Patient Name: Shannon Rosario MRN: [id number] DOB:[birth year], 62 y.o., female Today's Date: 01/15/2023  PCP: see eval REFERRING PROVIDER: see eval  END OF SESSION:   PT End of Session - 01/15/23 1232     Visit Number 9    Date for PT Re-Evaluation 02/26/23    PT Start Time 1231    PT Stop Time 1310    PT Time Calculation (min) 39 min            Past Medical History:  Diagnosis Date   Anxiety    Arthritis    Depression    Diabetes mellitus without complication ([place])    Family history of adverse reaction to anesthesia    My Mother had some complications,I dont remember what    Fibromyalgia    GERD (gastroesophageal reflux disease)    Heart murmur    Hypercholesterolemia    Hypertension    Obesity    OSA (obstructive sleep apnea)    TIA (transient ischemic attack) 12/24/2010   Past Surgical History:  Procedure Laterality Date   [place] Left 1995   LEFT HEART CATH AND CORONARY ANGIOGRAPHY N/A 03/16/2021   Procedure: LEFT HEART CATH AND CORONARY ANGIOGRAPHY;  Surgeon: Nigel Mormon, MD;  Location: [facility];  Service: Cardiovascular;  Laterality: N/A;   [place]   Patient Active Problem List   Diagnosis Date Noted   Angina pectoris, unstable ([place]) 08/11/2021   Non-restorative sleep 08/11/2021   Unstable angina ([place]) 03/16/2021   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   Fire ant bite 10/22/2016   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/2016   Allergic urticaria 10/22/2016   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 10/22/2016   Obstructive sleep apnea treated with continuous positive airway pressure (CPAP) 10/22/2016   Chest pain 09/21/2015   Essential hypertension 09/21/2015   Diabetes ([place]) 09/21/2015   Pain in the chest      REFERRING DIAG: cerv,shld and back  THERAPY DIAG:  Muscle weakness (generalized)  Chronic right shoulder pain  Cervicalgia  Cramp and spasm  Difficulty in walking, not elsewhere classified  Rationale for Evaluation and Treatment Rehabilitation  PERTINENT HISTORY: see note  PRECAUTIONS: none  OBJECTIVE: (objective measures completed at initial evaluation unless otherwise dated)  ONSET DATE: 11/29/22  SUBJECTIVE STATEMENT: She says the medication does seem to be helping with her inflammation. She feels she is slowly improving and is able to reach across her body better.  PERTINENT HISTORY:  Patient reports she fell in February, sustaining injuries to L hip, lower back, L shoulder, neck. She received an injection in her L shoulder for pain and inflammation on 2/13. No hip injection per Dr decision.    PAIN:  Are you having pain? Yes: NPRS scale: 5/10 Pain location: R shoulder, distal  deltoid Pain description: pressure Aggravating factors: Sometimes raising up above her head, stirring a mixing bowl Relieving factors: Biofreeze   PRECAUTIONS: None   WEIGHT BEARING RESTRICTIONS: No   FALLS:  Has patient fallen in last 6 months? No   LIVING ENVIRONMENT: Lives with: lives with their family Lives in: House/apartment Stairs: Yes: Internal: 14 steps; on left going up and External: 1 steps; none Has following equipment at home: None   OCCUPATION: On disability   PLOF: Independent   PATIENT GOALS: Patient would like to have relief from shoulder pain and improve her ROM to normal.   NEXT MD VISIT:    OBJECTIVE:    DIAGNOSTIC FINDINGS:  Lumbar spine MRI ordered.     SCREENING FOR RED FLAGS: Bowel or bladder incontinence: No Spinal tumors: No Cauda equina syndrome: No Compression  fracture: No Abdominal aneurysm: No   COGNITION: Overall cognitive status: Within functional limits for tasks assessed                          SENSATION: Not tested   POSTURE: decreased thoracic kyphosis   PALPATION: Tight and TTP along B cervical paraspinals, increased around upper traps and medial scapulae, tight pects, tight scalenes, TTP on deltoid insertion. R supraspinatus tendon and biceps tendons both with severe TTP   CERVICAL ROM:    AROM eval 12/18/22  Flexion 100% WFLs- radiating symptoms  Extension 100 Limited 25%-radiating pain   Right lateral flexion 80   Left lateral flexion 80   Right rotation 100 Decreased 50%  Left rotation 100 Dereased 25%   (Blank rows = not tested)   UPPER EXTREMITY ROM:   LUE WNL, R shoulder flexion WNL, but painful, abd to 90 degrees, painful   UPPER EXTREMITY MMT:  LUE WNL, R shoulder MMT limited due to pain with any resistance, elbow WFL     LUMBAR SPECIAL TESTS:  Deferred as patient now denies low back pain after receiving injections SHOULDER TESTS: Hawkins Test: (+) R                                     Painful arc test (+) on R                                    Empty Can Test (+) on R                                     Spurling's test (-) B. GAIT: Distance walked: In clinic distances Assistive device utilized: None Level of assistance: Complete Independence Comments: No obvious gait deviations.   TODAY'S TREATMENT:  DATE:  01/15/23 NuStep L5 x 6 minutes. STM to R pects F/B stretch Scapular and humeral joint mobs PROM for R shoulder flexion and abd, IR/ER Standing shoulder abd, flex, ext with 3# R, 4# L, 2 x 10 each Seated rows and lat pulls, 25#, 2 x 10 each MH to R shoulder x 10 minutes  01/08/23 NuStep L5 x 70mn Shoulder ER yellow 2x15 Shoulder Abd 2lb 2x10 Shouldr flex 3lb 2x10 Standing  shoulder Ext 10lb 2x10 Standing shoulder Ext 10lb 2x10 Triceps Ext ext 20lb 2x10 Biceps Curls 10lb 2x10  MHP R shoulder x10 min   01/03/23 Physical Re-assessment due to continued C/O pain STM and joint mobs, inf and post glide, Grade lll 2 x 10 Scapular mobilizations Prone sh ext, T, Y x 6 each with 5 sec hold. Practiced performing correctly, required mod V and min TC to not pull too hard and hike shoulders Doorway stretch for B shoulders, linked to deep breathing to release tension Spurling's neg B.  01/01/23 UBE L2 x 3 min each AAROM  2lb WaTE flex, Ext, IR  RUE ER yellow x10, Red x10 RUE IR red 2x12 Shoulder Flex 2lb 2x10 Shoulder abd 2lb 2x10 MH/IFC cerv and rt shld 10 min in sitting at pt request  12/25/22 UBE L 3 3 min fwd/3 min back RT shld joint mobs and capsule stretch. PROM FULL. AROM 160 degrees flexion, ABD 110 Red tband shld ext,row, abd and ER 15 x each Standing back to wall flex flex,abd, horz abd 10 x 2# each 2# PNF and empty can 2# 15x each Red tband 3 pt stab on wall 10 BIL Finger ladder 5 x flex and abd with eccentric lowering MH/IFC cerv and rt shld 10 min in sitting at pt request Stopped ionto as she had 4 patches with NE  12/20/22 UBE L3 x 2.5 min each way STM to R pects, supraspinatus F/B stretch R humerus joint mobilization inferior and post glide Patient led stretch including holding doorframe with ER and doorway stretch for pects. Shoulder/scap strengthening, wall slides- flexion x 10, tried scaption, but caused impingement pain, so deferred. Wall stars against red tband resistance, 5 reps each arm Seated row with 15# resistance, 2 x 10 reps. Vaso to R shoulder med pressure, 34 degrees, 10 minutes Ionto patch to R shoulder, 1.2 cc Dextro  12/18/22 UBE L 3 2 min each way Seated row and lats 20# 2 sets 10 Cable pulley shld ext 5# 2 sets 10 3# empty can 2 sets 10 3# PNF 2 sets 10 Yellow tband 3 point scap stab BIL 10 each IFC/MH to RT shld Ionto  RT shld 1.2 cc dex    12/13/22 UBE L 1 x 2 min forward/2 min backwards, stopped at 3 min due to post rotation hurting STM to upper traps, pects on R Shoulder joint mobs into inferior and post glide  Manual cervical traction x 60 sec Scapular mobilization into depression Wall stars- yellow Tband, painful to move R hand, so stabilized with R while moving L hand Wall slides- towel on wall, 10 flex, 10 scaption on R. VASO to R shoulder 10 min, 34 mod pressure Ionto patch to R shoulder 1.2 cc Dextro  12/07/22 UBE L 1 1.5 min fwd/1.5 min backward Yellow tband shld ext,row and ER 10x  Cerv retraction head on ball on wall hold 3 sec 10 x 2# empty can 10 x ER 2# 10x at side and at 90 degrees abd PROM RT shld , jt  mobs and capsular stretch IFC/MH to RT shld Ionto RT shld 1.2 cc dex   12/04/22 Education, HEP      PATIENT EDUCATION:  Education details: POC HEP Person educated: Patient Education method: Consulting civil engineer, Media planner, and Handouts Education comprehension: verbalized understanding and returned demonstration   HOME EXERCISE PROGRAM: [id number]   ASSESSMENT:   CLINICAL IMPRESSION: Patient reports continued pain, but she feels it is slowly getting a little better. Continued with joint mobs, STM with deep pressure and stretch to R pects. She then proceeded with strengthening for shoulder and scapulae and finished with MH to shoulder. She noted improved ROM and painfree mobility after treatment  OBJECTIVE IMPAIRMENTS: decreased coordination, decreased ROM, decreased strength, hypomobility, increased fascial restrictions, increased muscle spasms, improper body mechanics, postural dysfunction, and pain.    ACTIVITY LIMITATIONS: carrying, lifting, sleeping, reach over head, and caring for others   PARTICIPATION LIMITATIONS: cleaning, laundry, shopping, and community activity   PERSONAL FACTORS: Past/current experiences are also affecting patient's functional outcome.    REHAB  POTENTIAL: Good   CLINICAL DECISION MAKING: Evolving/moderate complexity   EVALUATION COMPLEXITY: Moderate     GOALS: Goals reviewed with patient? Yes   SHORT TERM GOALS: Target date: 12/22/22   I with initial HEP Baseline: Goal status: 12/07/22 MET   LONG TERM GOALS: Target date: 02/26/23   I with final HEP Baseline:  Goal status: on going 12/18/22   2.  Decrease shoulder pain with activity to <4/10 Baseline: 10/10 Goal status: ongoing 12/18/22   3.  Increase R shoulder strength to 4/5, including scapular stabilizers. Baseline: 3/5 Goal status: ongoing 12/18/22. 12/25/22 ongoing fatigues quickly.   4.  Patient will achieve at least 120 degrees of R shoulder abd, with pain< 4/10 Baseline:  Goal status: 12/18/22 RT shld abd sitting 121 pain 8/10  12/25/22 abd 110 with increased pain   PLAN:   PT FREQUENCY: 2x/week   PT DURATION: 12 weeks   PLANNED INTERVENTIONS: Therapeutic exercises, Therapeutic activity, Neuromuscular re-education, Balance training, Gait training, Patient/Family education, Self Care, Joint mobilization, Dry Needling, Electrical stimulation, Cryotherapy, Moist heat, Ionotophoresis '4mg'$ /ml Dexamethasone, and Manual therapy.   PLAN FOR NEXT SESSION: improved shld AROM but still very painful and fatigued. ?            Patient Details  Name: Shannon Rosario MRN: [id number] Date of Birth: 11-May-1961 Referring Provider:  Benito Mccreedy, MD  Ethel Rana DPT 01/15/23 1:34 PM   [facility] at [place]. [place], [place], [address] Phone: [phone]   Fax:  [phone]

## 2023-01-17 ENCOUNTER — Ambulatory Visit: Payer: 59 | Admitting: Physical Therapy

## 2023-01-22 ENCOUNTER — Ambulatory Visit: Payer: 59 | Admitting: Physical Therapy

## 2023-01-22 DIAGNOSIS — M6281 Muscle weakness (generalized): Secondary | ICD-10-CM | POA: Diagnosis not present

## 2023-01-22 DIAGNOSIS — R262 Difficulty in walking, not elsewhere classified: Secondary | ICD-10-CM | POA: Diagnosis not present

## 2023-01-22 DIAGNOSIS — M542 Cervicalgia: Secondary | ICD-10-CM | POA: Diagnosis not present

## 2023-01-22 DIAGNOSIS — G8929 Other chronic pain: Secondary | ICD-10-CM | POA: Diagnosis not present

## 2023-01-22 DIAGNOSIS — R252 Cramp and spasm: Secondary | ICD-10-CM | POA: Diagnosis not present

## 2023-01-22 DIAGNOSIS — M25511 Pain in right shoulder: Secondary | ICD-10-CM | POA: Diagnosis not present

## 2023-01-22 NOTE — Therapy (Signed)
OUTPATIENT PHYSICAL THERAPY TREATMENT NOTE Progress Note Reporting Period 12/04/22 to 01/22/23  See note below for Objective Data and Assessment of Progress/Goals.      Patient Name: Shannon Rosario MRN: [id number] DOB:Sep 05, 1961, 62 y.o., female Today's Date: 01/22/2023  PCP: see eval REFERRING PROVIDER: see eval  END OF SESSION:   PT End of Session - 01/22/23 1102     Visit Number 10    Date for PT Re-Evaluation 02/26/23    PT Start Time 2    PT Stop Time 1145    PT Time Calculation (min) 45 min            Past Medical History:  Diagnosis Date   Anxiety    Arthritis    Depression    Diabetes mellitus without complication ([place])    Family history of adverse reaction to anesthesia    My Mother had some complications,I dont remember what    Fibromyalgia    GERD (gastroesophageal reflux disease)    Heart murmur    Hypercholesterolemia    Hypertension    Obesity    OSA (obstructive sleep apnea)    TIA (transient ischemic attack) 12/24/2010   Past Surgical History:  Procedure Laterality Date   [place] Left 1995   LEFT HEART CATH AND CORONARY ANGIOGRAPHY N/A 03/16/2021   Procedure: LEFT HEART CATH AND CORONARY ANGIOGRAPHY;  Surgeon: Nigel Mormon, MD;  Location: [facility];  Service: Cardiovascular;  Laterality: N/A;   [place]   Patient Active Problem List   Diagnosis Date Noted   Angina pectoris, unstable ([place]) 08/11/2021   Non-restorative sleep 08/11/2021   Unstable angina ([place]) 03/16/2021   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   Fire ant bite 10/22/2016   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/2016   Allergic urticaria 10/22/2016   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 10/22/2016   Obstructive sleep apnea treated with continuous positive airway pressure  (CPAP) 10/22/2016   Chest pain 09/21/2015   Essential hypertension 09/21/2015   Diabetes ([place]) 09/21/2015   Pain in the chest     REFERRING DIAG: cerv,shld and back  THERAPY DIAG:  Muscle weakness (generalized)  Chronic right shoulder pain  Rationale for Evaluation and Treatment Rehabilitation  PERTINENT HISTORY: see note  PRECAUTIONS: none  OBJECTIVE: (objective measures completed at initial evaluation unless otherwise dated)  ONSET DATE: 11/29/22  SUBJECTIVE STATEMENT: exhausted and total care for dad. Shld getting some better slowly. Shld 80% better since [facility]- inconsistent pain now vs constant. Still TP present  PERTINENT HISTORY:  Patient reports she fell in February, sustaining injuries to L hip, lower back, L shoulder, neck. She received an injection in her L shoulder for pain and inflammation on 2/13. No hip injection per Dr decision.    PAIN:  Are you having pain? Yes: NPRS scale: 3/10 Pain location: R shoulder, distal  deltoid Pain description: pressure Aggravating factors: Sometimes raising up above her head, stirring a mixing bowl Relieving factors: Biofreeze   PRECAUTIONS: None   WEIGHT BEARING RESTRICTIONS: No   FALLS:  Has patient fallen in last 6 months? No   LIVING ENVIRONMENT: Lives with: lives with their family Lives in: House/apartment Stairs: Yes: Internal: 14 steps; on left going up and External: 1 steps; none Has following equipment at home: None   OCCUPATION: On disability   PLOF: Independent   PATIENT GOALS: Patient would like to have relief from shoulder pain and improve her ROM to normal.   NEXT MD VISIT:    OBJECTIVE:    DIAGNOSTIC FINDINGS:  Lumbar spine MRI ordered.     SCREENING FOR RED FLAGS: Bowel or bladder incontinence: No Spinal tumors:  No Cauda equina syndrome: No Compression fracture: No Abdominal aneurysm: No   COGNITION: Overall cognitive status: Within functional limits for tasks assessed                          SENSATION: Not tested   POSTURE: decreased thoracic kyphosis   PALPATION: Tight and TTP along B cervical paraspinals, increased around upper traps and medial scapulae, tight pects, tight scalenes, TTP on deltoid insertion. R supraspinatus tendon and biceps tendons both with severe TTP   CERVICAL ROM:    AROM eval 12/18/22 01/22/23  Flexion 100% WFLs- radiating symptoms WFLS  Extension 100 Limited 25%-radiating pain  WFLS  Right lateral flexion 80    Left lateral flexion 80    Right rotation 100 Decreased 50% WFLS with pain  Left rotation 100 Dereased 25% WFLS   (Blank rows = not tested)   UPPER EXTREMITY ROM:   LUE WNL, R shoulder flexion WNL, but painful, abd to 90 degrees, painful 01/22/23 RT shld  flexion 180,abd 140,ER 90,IR 80   UPPER EXTREMITY MMT:  LUE WNL, R shoulder MMT limited due to pain with any resistance, elbow WFL     LUMBAR SPECIAL TESTS:  Deferred as patient now denies low back pain after receiving injections SHOULDER TESTS: Hawkins Test: (+) R                                     Painful arc test (+) on R                                    Empty Can Test (+) on R                                     Spurling's test (-) B. GAIT: Distance walked: In clinic distances Assistive device utilized: None Level of assistance: Complete Independence Comments: No obvious gait deviations.   TODAY'S TREATMENT:  DATE:   01/22/23 UBE L 4 3 min fwd/3 min backward Lat pull 25# 2 sets 10 Seated row 25# 2 sets 10 Chest press with serratus 15# 5 x then decreased to 10# 5 x then 10 x Shld abd standing with red tband 2 sets 10 Red tband ER 2 sets 10 Tricep ext 20# 2  sets 10 PROM and STW to RT shld to increase ROM painful motion esp abd DN to rt shld to release TP by Michele Mcalpine PT MH 10 min RT shld  01/15/23 NuStep L5 x 6 minutes. STM to R pects F/B stretch Scapular and humeral joint mobs PROM for R shoulder flexion and abd, IR/ER Standing shoulder abd, flex, ext with 3# R, 4# L, 2 x 10 each Seated rows and lat pulls, 25#, 2 x 10 each MH to R shoulder x 10 minutes  01/08/23 NuStep L5 x 38mn Shoulder ER yellow 2x15 Shoulder Abd 2lb 2x10 Shouldr flex 3lb 2x10 Standing shoulder Ext 10lb 2x10 Standing shoulder Ext 10lb 2x10 Triceps Ext ext 20lb 2x10 Biceps Curls 10lb 2x10  MHP R shoulder x10 min   01/03/23 Physical Re-assessment due to continued C/O pain STM and joint mobs, inf and post glide, Grade lll 2 x 10 Scapular mobilizations Prone sh ext, T, Y x 6 each with 5 sec hold. Practiced performing correctly, required mod V and min TC to not pull too hard and hike shoulders Doorway stretch for B shoulders, linked to deep breathing to release tension Spurling's neg B.  01/01/23 UBE L2 x 3 min each AAROM  2lb WaTE flex, Ext, IR  RUE ER yellow x10, Red x10 RUE IR red 2x12 Shoulder Flex 2lb 2x10 Shoulder abd 2lb 2x10 MH/IFC cerv and rt shld 10 min in sitting at pt request  12/25/22 UBE L 3 3 min fwd/3 min back RT shld joint mobs and capsule stretch. PROM FULL. AROM 160 degrees flexion, ABD 110 Red tband shld ext,row, abd and ER 15 x each Standing back to wall flex flex,abd, horz abd 10 x 2# each 2# PNF and empty can 2# 15x each Red tband 3 pt stab on wall 10 BIL Finger ladder 5 x flex and abd with eccentric lowering MH/IFC cerv and rt shld 10 min in sitting at pt request Stopped ionto as she had 4 patches with NE  12/20/22 UBE L3 x 2.5 min each way STM to R pects, supraspinatus F/B stretch R humerus joint mobilization inferior and post glide Patient led stretch including holding doorframe with ER and doorway stretch for  pects. Shoulder/scap strengthening, wall slides- flexion x 10, tried scaption, but caused impingement pain, so deferred. Wall stars against red tband resistance, 5 reps each arm Seated row with 15# resistance, 2 x 10 reps. Vaso to R shoulder med pressure, 34 degrees, 10 minutes Ionto patch to R shoulder, 1.2 cc Dextro  12/18/22 UBE L 3 2 min each way Seated row and lats 20# 2 sets 10 Cable pulley shld ext 5# 2 sets 10 3# empty can 2 sets 10 3# PNF 2 sets 10 Yellow tband 3 point scap stab BIL 10 each IFC/MH to RT shld Ionto RT shld 1.2 cc dex    12/13/22 UBE L 1 x 2 min forward/2 min backwards, stopped at 3 min due to post rotation hurting STM to upper traps, pects on R Shoulder joint mobs into inferior and post glide  Manual cervical traction x 60 sec Scapular mobilization into depression Wall stars- yellow Tband, painful to  move R hand, so stabilized with R while moving L hand Wall slides- towel on wall, 10 flex, 10 scaption on R. VASO to R shoulder 10 min, 34 mod pressure Ionto patch to R shoulder 1.2 cc Dextro  12/07/22 UBE L 1 1.5 min fwd/1.5 min backward Yellow tband shld ext,row and ER 10x  Cerv retraction head on ball on wall hold 3 sec 10 x 2# empty can 10 x ER 2# 10x at side and at 90 degrees abd PROM RT shld , jt mobs and capsular stretch IFC/MH to RT shld Ionto RT shld 1.2 cc dex   12/04/22 Education, HEP      PATIENT EDUCATION:  Education details: POC HEP Person educated: Patient Education method: Consulting civil engineer, Media planner, and Handouts Education comprehension: verbalized understanding and returned demonstration   HOME EXERCISE PROGRAM: [id number]   ASSESSMENT:   CLINICAL IMPRESSION: Patient reports continued pain, but she feels it is slowly getting a little better. Pt reports 80% better. ROM assessed as well as goals and documented. States TP still present and certain activities better but painful  OBJECTIVE IMPAIRMENTS: decreased coordination,  decreased ROM, decreased strength, hypomobility, increased fascial restrictions, increased muscle spasms, improper body mechanics, postural dysfunction, and pain.    ACTIVITY LIMITATIONS: carrying, lifting, sleeping, reach over head, and caring for others   PARTICIPATION LIMITATIONS: cleaning, laundry, shopping, and community activity   PERSONAL FACTORS: Past/current experiences are also affecting patient's functional outcome.    REHAB POTENTIAL: Good   CLINICAL DECISION MAKING: Evolving/moderate complexity   EVALUATION COMPLEXITY: Moderate     GOALS: Goals reviewed with patient? Yes   SHORT TERM GOALS: Target date: 12/22/22   I with initial HEP Baseline: Goal status: 12/07/22 MET   LONG TERM GOALS: Target date: 02/26/23   I with final HEP Baseline:  Goal status: on going 12/18/22 evolving 01/22/23    2.  Decrease shoulder pain with activity to <4/10 Baseline: 10/10 Goal status: ongoing 12/18/22 01/22/23 partially met pain with certain mvmt/actvity ( whisking and beating with cooking) hair and washing under arm better. Grocery lifting is hard   3.  Increase R shoulder strength to 4/5, including scapular stabilizers. Baseline: 3/5 Goal status: ongoing 12/18/22. 12/25/22 ongoing fatigues quickly. 01/22/23 progressing   4.  Patient will achieve at least 120 degrees of R shoulder abd, with pain< 4/10 Baseline:  Goal status: 12/18/22 RT shld abd sitting 121 pain 8/10  12/25/22 abd 110 with increased pain 01/22/23 met   PLAN:   PT FREQUENCY: 2x/week   PT DURATION: 12 weeks   PLANNED INTERVENTIONS: Therapeutic exercises, Therapeutic activity, Neuromuscular re-education, Balance training, Gait training, Patient/Family education, Self Care, Joint mobilization, Dry Needling, Electrical stimulation, Cryotherapy, Moist heat, Ionotophoresis '4mg'$ /ml Dexamethasone, and Manual therapy.   PLAN FOR NEXT SESSION: improved shld AROM but still very painful and fatigued. ?            Patient  Details  Name: Dazhane Villagomez MRN: [id number] Date of Birth: 1961/10/31 Referring Provider:  Benito Mccreedy, MD  Ethel Rana DPT 01/22/23 11:02 AM   [facility] [address] [place]. [place], [place], [address] Phone: [phone]   Fax:  [facility] [place]. [place], [place], [address] Phone: [phone]   Fax:  [phone]  Patient Details  Name: Eugenia Eldredge MRN: [id number] Date of Birth: [birth year] Referring Provider:  Benito Mccreedy, MD  Encounter Date: 01/22/2023   Laqueta Carina, PTA 01/22/2023, 11:02  AM  [facility] at [address]. [place], [place], [address] Phone: [phone]   Fax:  [phone]

## 2023-01-24 ENCOUNTER — Ambulatory Visit: Payer: 59 | Attending: Physical Medicine and Rehabilitation | Admitting: Physical Therapy

## 2023-01-24 ENCOUNTER — Encounter: Payer: Self-pay | Admitting: Physical Therapy

## 2023-01-24 DIAGNOSIS — M542 Cervicalgia: Secondary | ICD-10-CM | POA: Insufficient documentation

## 2023-01-24 DIAGNOSIS — M6281 Muscle weakness (generalized): Secondary | ICD-10-CM | POA: Diagnosis not present

## 2023-01-24 DIAGNOSIS — M5416 Radiculopathy, lumbar region: Secondary | ICD-10-CM | POA: Insufficient documentation

## 2023-01-24 DIAGNOSIS — R252 Cramp and spasm: Secondary | ICD-10-CM | POA: Diagnosis not present

## 2023-01-24 DIAGNOSIS — M25511 Pain in right shoulder: Secondary | ICD-10-CM | POA: Diagnosis not present

## 2023-01-24 DIAGNOSIS — G8929 Other chronic pain: Secondary | ICD-10-CM | POA: Insufficient documentation

## 2023-01-24 DIAGNOSIS — R262 Difficulty in walking, not elsewhere classified: Secondary | ICD-10-CM | POA: Insufficient documentation

## 2023-01-24 NOTE — Therapy (Signed)
OUTPATIENT PHYSICAL THERAPY TREATMENT NOTE Progress Note Reporting Period 12/04/22 to 01/22/23  See note below for Objective Data and Assessment of Progress/Goals.      Patient Name: Shannon Rosario MRN: [id number] DOB:1961/03/15, 62 y.o., female Today's Date: 01/24/2023  PCP: see eval REFERRING PROVIDER: see eval  END OF SESSION:   PT End of Session - 01/24/23 1110     Visit Number 11    Date for PT Re-Evaluation 02/26/23    PT Start Time 1106    PT Stop Time 1144    PT Time Calculation (min) 38 min    Activity Tolerance Patient tolerated treatment well    Behavior During Therapy WFL for tasks assessed/performed             Past Medical History:  Diagnosis Date   Anxiety    Arthritis    Depression    Diabetes mellitus without complication ([place])    Family history of adverse reaction to anesthesia    My Mother had some complications,I dont remember what    Fibromyalgia    GERD (gastroesophageal reflux disease)    Heart murmur    Hypercholesterolemia    Hypertension    Obesity    OSA (obstructive sleep apnea)    TIA (transient ischemic attack) 12/24/2010   Past Surgical History:  Procedure Laterality Date   [place] Left 1995   LEFT HEART CATH AND CORONARY ANGIOGRAPHY N/A 03/16/2021   Procedure: LEFT HEART CATH AND CORONARY ANGIOGRAPHY;  Surgeon: Nigel Mormon, MD;  Location: [facility];  Service: Cardiovascular;  Laterality: N/A;   [place]   Patient Active Problem List   Diagnosis Date Noted   Angina pectoris, unstable ([place]) 08/11/2021   Non-restorative sleep 08/11/2021   Unstable angina ([place]) 03/16/2021   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   Fire ant bite 10/22/2016   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/2016   Allergic urticaria 10/22/2016   Chronic seasonal allergic  rhinitis due to fungal spores 10/22/2016   Obstructive sleep apnea treated with continuous positive airway pressure (CPAP) 10/22/2016   Chest pain 09/21/2015   Essential hypertension 09/21/2015   Diabetes ([place]) 09/21/2015   Pain in the chest     REFERRING DIAG: cerv,shld and back  THERAPY DIAG:  Muscle weakness (generalized)  Chronic right shoulder pain  Cervicalgia  Cramp and spasm  Rationale for Evaluation and Treatment Rehabilitation  PERTINENT HISTORY: see note  PRECAUTIONS: none  OBJECTIVE: (objective measures completed at initial evaluation unless otherwise dated)  ONSET DATE: 11/29/22  SUBJECTIVE STATEMENT: Patient reports continued fatigue due to stress of caring for her Dad. Shoulder feels some better. The DN helped, declines to try it today, but may in the future. PERTINENT HISTORY:  Patient reports she fell in February, sustaining injuries to L hip, lower back, L shoulder, neck. She received an injection in her L shoulder for pain and inflammation on 2/13. No hip injection per Dr decision.    PAIN:  Are you having pain? Yes: NPRS scale: 3/10 Pain location: R shoulder, distal  deltoid Pain description: pressure Aggravating factors: Sometimes raising up above her head, stirring a mixing bowl Relieving factors: Biofreeze   PRECAUTIONS: None   WEIGHT BEARING RESTRICTIONS: No   FALLS:  Has patient fallen in last 6 months? No   LIVING ENVIRONMENT: Lives with: lives with their family Lives in: House/apartment Stairs: Yes: Internal: 14 steps; on left going up and External: 1 steps; none Has following equipment at home: None   OCCUPATION: On disability   PLOF: Independent   PATIENT GOALS: Patient would like to have relief from shoulder pain and improve her ROM to normal.    NEXT MD VISIT:    OBJECTIVE:    DIAGNOSTIC FINDINGS:  Lumbar spine MRI ordered.     SCREENING FOR RED FLAGS: Bowel or bladder incontinence: No Spinal tumors: No Cauda equina syndrome: No Compression fracture: No Abdominal aneurysm: No   COGNITION: Overall cognitive status: Within functional limits for tasks assessed                          SENSATION: Not tested   POSTURE: decreased thoracic kyphosis   PALPATION: Tight and TTP along B cervical paraspinals, increased around upper traps and medial scapulae, tight pects, tight scalenes, TTP on deltoid insertion. R supraspinatus tendon and biceps tendons both with severe TTP   CERVICAL ROM:    AROM eval 12/18/22 01/22/23  Flexion 100% WFLs- radiating symptoms WFLS  Extension 100 Limited 25%-radiating pain  WFLS  Right lateral flexion 80    Left lateral flexion 80    Right rotation 100 Decreased 50% WFLS with pain  Left rotation 100 Dereased 25% WFLS   (Blank rows = not tested)   UPPER EXTREMITY ROM:   LUE WNL, R shoulder flexion WNL, but painful, abd to 90 degrees, painful 01/22/23 RT shld  flexion 180,abd 140,ER 90,IR 80   UPPER EXTREMITY MMT:  LUE WNL, R shoulder MMT limited due to pain with any resistance, elbow WFL     LUMBAR SPECIAL TESTS:  Deferred as patient now denies low back pain after receiving injections SHOULDER TESTS: Hawkins Test: (+) R                                     Painful arc test (+) on R                                    Empty Can Test (+) on R                                     Spurling's test (-) B. GAIT: Distance walked: In clinic distances Assistive device utilized: None Level of assistance: Complete Independence Comments: No obvious gait deviations.  TODAY'S TREATMENT:                                                                                                                              DATE:  01/24/23 NuStep L5 x 5 minutes STM to up traps, pects on R, R humeral joint mobs,  soft tissue stretch AAROM in supine with dowel for flexion and abd, 10 each PR humeral depression in sitting Wall slides into flexion and scaption x 10 each Standing shoulder ext, rows with 10# resistance, 2 x 10 reps. B shoulder ER against G tband resistance, 2 x 10 reps MH to L shoulder x 10 minutes for pain relief   01/22/23 UBE L 4 3 min fwd/3 min backward Lat pull 25# 2 sets 10 Seated row 25# 2 sets 10 Chest press with serratus 15# 5 x then decreased to 10# 5 x then 10 x Shld abd standing with red tband 2 sets 10 Red tband ER 2 sets 10 Tricep ext 20# 2 sets 10 PROM and STW to RT shld to increase ROM painful motion esp abd DN to rt shld to release TP by Michele Mcalpine PT MH 10 min RT shld  01/15/23 NuStep L5 x 6 minutes. STM to R pects F/B stretch Scapular and humeral joint mobs PROM for R shoulder flexion and abd, IR/ER Standing shoulder abd, flex, ext with 3# R, 4# L, 2 x 10 each Seated rows and lat pulls, 25#, 2 x 10 each MH to R shoulder x 10 minutes  01/08/23 NuStep L5 x 49mn Shoulder ER yellow 2x15 Shoulder Abd 2lb 2x10 Shouldr flex 3lb 2x10 Standing shoulder Ext 10lb 2x10 Standing shoulder Ext 10lb 2x10 Triceps Ext ext 20lb 2x10 Biceps Curls 10lb 2x10  MHP R shoulder x10 min   01/03/23 Physical Re-assessment due to continued C/O pain STM and joint mobs, inf and post glide, Grade lll 2 x 10 Scapular mobilizations Prone sh ext, T, Y x 6 each with 5 sec hold. Practiced performing correctly, required mod V and min TC to not pull too hard and hike shoulders Doorway stretch for B shoulders, linked to deep breathing to release tension Spurling's neg B.  01/01/23 UBE L2 x 3 min each AAROM  2lb WaTE flex, Ext, IR  RUE ER yellow x10, Red x10 RUE IR red 2x12 Shoulder Flex 2lb 2x10 Shoulder abd 2lb 2x10 MH/IFC cerv and rt shld 10 min in sitting at pt request  12/25/22 UBE L 3 3 min fwd/3 min back RT shld joint mobs and capsule stretch. PROM FULL. AROM 160 degrees  flexion, ABD 110 Red tband shld ext,row, abd and ER 15 x each Standing back to wall flex flex,abd, horz abd 10 x 2# each 2# PNF and empty can 2# 15x each Red tband 3 pt stab on wall 10 BIL Finger ladder 5 x flex and abd with eccentric lowering MH/IFC cerv and rt shld 10 min in sitting at pt request Stopped ionto  as she had 4 patches with NE  12/20/22 UBE L3 x 2.5 min each way STM to R pects, supraspinatus F/B stretch R humerus joint mobilization inferior and post glide Patient led stretch including holding doorframe with ER and doorway stretch for pects. Shoulder/scap strengthening, wall slides- flexion x 10, tried scaption, but caused impingement pain, so deferred. Wall stars against red tband resistance, 5 reps each arm Seated row with 15# resistance, 2 x 10 reps. Vaso to R shoulder med pressure, 34 degrees, 10 minutes Ionto patch to R shoulder, 1.2 cc Dextro  12/18/22 UBE L 3 2 min each way Seated row and lats 20# 2 sets 10 Cable pulley shld ext 5# 2 sets 10 3# empty can 2 sets 10 3# PNF 2 sets 10 Yellow tband 3 point scap stab BIL 10 each IFC/MH to RT shld Ionto RT shld 1.2 cc dex    12/13/22 UBE L 1 x 2 min forward/2 min backwards, stopped at 3 min due to post rotation hurting STM to upper traps, pects on R Shoulder joint mobs into inferior and post glide  Manual cervical traction x 60 sec Scapular mobilization into depression Wall stars- yellow Tband, painful to move R hand, so stabilized with R while moving L hand Wall slides- towel on wall, 10 flex, 10 scaption on R. VASO to R shoulder 10 min, 34 mod pressure Ionto patch to R shoulder 1.2 cc Dextro  12/07/22 UBE L 1 1.5 min fwd/1.5 min backward Yellow tband shld ext,row and ER 10x  Cerv retraction head on ball on wall hold 3 sec 10 x 2# empty can 10 x ER 2# 10x at side and at 90 degrees abd PROM RT shld , jt mobs and capsular stretch IFC/MH to RT shld Ionto RT shld 1.2 cc dex   12/04/22 Education, HEP       PATIENT EDUCATION:  Education details: POC HEP Person educated: Patient Education method: Consulting civil engineer, Media planner, and Handouts Education comprehension: verbalized understanding and returned demonstration   HOME EXERCISE PROGRAM: [id number]   ASSESSMENT:   CLINICAL IMPRESSION: Patient reports continued high stress at home with continued pain, but still feels the shoulder is slowly getting better. Continued with STM, stretch, and strengthening. She tolerated all activities, but reported some pain with any activities > 90 degrees.  OBJECTIVE IMPAIRMENTS: decreased coordination, decreased ROM, decreased strength, hypomobility, increased fascial restrictions, increased muscle spasms, improper body mechanics, postural dysfunction, and pain.    ACTIVITY LIMITATIONS: carrying, lifting, sleeping, reach over head, and caring for others   PARTICIPATION LIMITATIONS: cleaning, laundry, shopping, and community activity   PERSONAL FACTORS: Past/current experiences are also affecting patient's functional outcome.    REHAB POTENTIAL: Good   CLINICAL DECISION MAKING: Evolving/moderate complexity   EVALUATION COMPLEXITY: Moderate     GOALS: Goals reviewed with patient? Yes   SHORT TERM GOALS: Target date: 12/22/22   I with initial HEP Baseline: Goal status: 12/07/22 MET   LONG TERM GOALS: Target date: 02/26/23   I with final HEP Baseline:  Goal status: on going 12/18/22 evolving 01/22/23    2.  Decrease shoulder pain with activity to <4/10 Baseline: 10/10 Goal status: ongoing 12/18/22 01/22/23 partially met pain with certain mvmt/actvity ( whisking and beating with cooking) hair and washing under arm better. Grocery lifting is hard   3.  Increase R shoulder strength to 4/5, including scapular stabilizers. Baseline: 3/5 Goal status: ongoing 12/18/22. 12/25/22 ongoing fatigues quickly. 01/22/23 progressing   4.  Patient will achieve at least 120  degrees of R shoulder abd, with pain<  4/10 Baseline:  Goal status: 12/18/22 RT shld abd sitting 121 pain 8/10  12/25/22 abd 110 with increased pain 01/22/23 met   PLAN:   PT FREQUENCY: 2x/week   PT DURATION: 12 weeks   PLANNED INTERVENTIONS: Therapeutic exercises, Therapeutic activity, Neuromuscular re-education, Balance training, Gait training, Patient/Family education, Self Care, Joint mobilization, Dry Needling, Electrical stimulation, Cryotherapy, Moist heat, Ionotophoresis '4mg'$ /ml Dexamethasone, and Manual therapy.   PLAN FOR NEXT SESSION: progress with ROM and strengthening for shoulder and trunk            Patient Details  Name: Rokia Bosket MRN: [id number] Date of Birth: May 14, 1961 Referring Provider:  Benito Mccreedy, MD  Ethel Rana DPT 01/24/23 2:40 PM   [facility] [place] [place]. [place], [place], [address] Phone: [phone]   Fax:  [facility] [place]. [place], [place], [address] Phone: [phone]   Fax:  [phone]

## 2023-02-04 ENCOUNTER — Ambulatory Visit: Payer: 59 | Admitting: Physical Therapy

## 2023-02-04 ENCOUNTER — Encounter: Payer: Self-pay | Admitting: Physical Therapy

## 2023-02-04 DIAGNOSIS — M5416 Radiculopathy, lumbar region: Secondary | ICD-10-CM | POA: Diagnosis not present

## 2023-02-04 DIAGNOSIS — R252 Cramp and spasm: Secondary | ICD-10-CM

## 2023-02-04 DIAGNOSIS — M25511 Pain in right shoulder: Secondary | ICD-10-CM | POA: Diagnosis not present

## 2023-02-04 DIAGNOSIS — M6281 Muscle weakness (generalized): Secondary | ICD-10-CM | POA: Diagnosis not present

## 2023-02-04 DIAGNOSIS — R262 Difficulty in walking, not elsewhere classified: Secondary | ICD-10-CM

## 2023-02-04 DIAGNOSIS — G8929 Other chronic pain: Secondary | ICD-10-CM | POA: Diagnosis not present

## 2023-02-04 DIAGNOSIS — M542 Cervicalgia: Secondary | ICD-10-CM

## 2023-02-04 NOTE — Therapy (Signed)
OUTPATIENT PHYSICAL THERAPY TREATMENT NOTE Progress Note Reporting Period 12/04/22 to 01/22/23  See note below for Objective Data and Assessment of Progress/Goals.      Patient Name: Shannon Rosario MRN: [id number] DOB:August 26, 1961, 62 y.o., female Today's Date: 02/04/2023  PCP: see eval REFERRING PROVIDER: see eval  END OF SESSION:   PT End of Session - 02/04/23 1547     Visit Number 12    Date for PT Re-Evaluation 02/26/23    PT Start Time 1542    PT Stop Time 1625    PT Time Calculation (min) 43 min    Activity Tolerance Patient tolerated treatment well    Behavior During Therapy WFL for tasks assessed/performed              Past Medical History:  Diagnosis Date   Anxiety    Arthritis    Depression    Diabetes mellitus without complication ([place])    Family history of adverse reaction to anesthesia    My Mother had some complications,I dont remember what    Fibromyalgia    GERD (gastroesophageal reflux disease)    Heart murmur    Hypercholesterolemia    Hypertension    Obesity    OSA (obstructive sleep apnea)    TIA (transient ischemic attack) 12/24/2010   Past Surgical History:  Procedure Laterality Date   APPENDECTOMY     CARPAL [place] Left 1995   LEFT HEART CATH AND CORONARY ANGIOGRAPHY N/A 03/16/2021   Procedure: LEFT HEART CATH AND CORONARY ANGIOGRAPHY;  Surgeon: Nigel Mormon, MD;  Location: [facility];  Service: Cardiovascular;  Laterality: N/A;   [place]   Patient Active Problem List   Diagnosis Date Noted   Angina pectoris, unstable ([place]) 08/11/2021   Non-restorative sleep 08/11/2021   Unstable angina ([place]) 03/16/2021   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   Fire ant bite 10/22/2016   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/2016   Allergic urticaria 10/22/2016   Chronic seasonal  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 10/22/2016   Obstructive sleep apnea treated with continuous positive airway pressure (CPAP) 10/22/2016   Chest pain 09/21/2015   Essential hypertension 09/21/2015   Diabetes ([place]) 09/21/2015   Pain in the chest     REFERRING DIAG: cerv,shld and back  THERAPY DIAG:  Muscle weakness (generalized)  Chronic right shoulder pain  Cervicalgia  Lumbar radiculopathy  Difficulty in walking, not elsewhere classified  Cramp and spasm  Rationale for Evaluation and Treatment Rehabilitation  PERTINENT HISTORY: see note  PRECAUTIONS: none  OBJECTIVE: (objective measures completed at initial evaluation unless otherwise dated)  ONSET DATE: 11/29/22  SUBJECTIVE STATEMENT: Patient reports That she is having tingling and numbness in her  PERTINENT HISTORY:  Patient reports she fell in February, sustaining injuries to L hip, lower back, L shoulder, neck. She received an injection in her L shoulder for pain and inflammation on 2/13. No hip injection per Dr decision.    PAIN:  Are you having pain? Yes: NPRS scale: 3/10 Pain location: R shoulder, distal  deltoid Pain description: pressure Aggravating factors: Sometimes raising up above her head, stirring a mixing bowl Relieving factors: Biofreeze   PRECAUTIONS: None   WEIGHT BEARING RESTRICTIONS: No   FALLS:  Has patient fallen in last 6 months? No   LIVING ENVIRONMENT: Lives with: lives with their family Lives in: House/apartment Stairs: Yes: Internal: 14 steps; on left going up and External: 1 steps; none Has following equipment at home: None   OCCUPATION: On disability   PLOF: Independent   PATIENT GOALS: Patient would like to have relief from shoulder pain and improve her ROM to normal.   NEXT MD VISIT:     OBJECTIVE:    DIAGNOSTIC FINDINGS:  Lumbar spine MRI ordered.     SCREENING FOR RED FLAGS: Bowel or bladder incontinence: No Spinal tumors: No Cauda equina syndrome: No Compression fracture: No Abdominal aneurysm: No   COGNITION: Overall cognitive status: Within functional limits for tasks assessed                          SENSATION: Not tested   POSTURE: decreased thoracic kyphosis   PALPATION: Tight and TTP along B cervical paraspinals, increased around upper traps and medial scapulae, tight pects, tight scalenes, TTP on deltoid insertion. R supraspinatus tendon and biceps tendons both with severe TTP   CERVICAL ROM:    AROM eval 12/18/22 01/22/23  Flexion 100% WFLs- radiating symptoms WFLS  Extension 100 Limited 25%-radiating pain  WFLS  Right lateral flexion 80    Left lateral flexion 80    Right rotation 100 Decreased 50% WFLS with pain  Left rotation 100 Dereased 25% WFLS   (Blank rows = not tested)   UPPER EXTREMITY ROM:   LUE WNL, R shoulder flexion WNL, but painful, abd to 90 degrees, painful 01/22/23 RT shld  flexion 180,abd 140,ER 90,IR 80   UPPER EXTREMITY MMT:  LUE WNL, R shoulder MMT limited due to pain with any resistance, elbow WFL     LUMBAR SPECIAL TESTS:  Deferred as patient now denies low back pain after receiving injections SHOULDER TESTS: Hawkins Test: (+) R                                     Painful arc test (+) on R                                    Empty Can Test (+) on R                                     Spurling's test (-) B. GAIT: Distance walked: In clinic distances Assistive device utilized: None Level of assistance: Complete Independence Comments: No obvious gait deviations.   TODAY'S TREATMENT:  DATE:  02/04/23 NuStep L5 x 6 minutes STM to R shoulder-Up traps, LS, followed by chin tucks,  Standing  shoulder ext, rows against 15#, ER against G tband, 2 x 10 reps each Sit to stand from elevated mat on air ex, holding 4# ball in BUE, OHP at top x 10 Repeat on solid floor with G tband at knees, no OHP Side to side stepping with G tband at knees, 4 x 6 steps each direction Seated trunk ext against B Tband, 2 x 10 Lat pulls, 25#, 2 x 10 reps Heel raise/calf stretch, 2 x 10 reps.  01/24/23 NuStep L5 x 5 minutes STM to up traps, pects on R, R humeral joint mobs, soft tissue stretch AAROM in supine with dowel for flexion and abd, 10 each PR humeral depression in sitting Wall slides into flexion and scaption x 10 each Standing shoulder ext, rows with 10# resistance, 2 x 10 reps. B shoulder ER against G tband resistance, 2 x 10 reps MH to L shoulder x 10 minutes for pain relief   01/22/23 UBE L 4 3 min fwd/3 min backward Lat pull 25# 2 sets 10 Seated row 25# 2 sets 10 Chest press with serratus 15# 5 x then decreased to 10# 5 x then 10 x Shld abd standing with red tband 2 sets 10 Red tband ER 2 sets 10 Tricep ext 20# 2 sets 10 PROM and STW to RT shld to increase ROM painful motion esp abd DN to rt shld to release TP by Michele Mcalpine PT MH 10 min RT shld  01/15/23 NuStep L5 x 6 minutes. STM to R pects F/B stretch Scapular and humeral joint mobs PROM for R shoulder flexion and abd, IR/ER Standing shoulder abd, flex, ext with 3# R, 4# L, 2 x 10 each Seated rows and lat pulls, 25#, 2 x 10 each MH to R shoulder x 10 minutes  01/08/23 NuStep L5 x 19mn Shoulder ER yellow 2x15 Shoulder Abd 2lb 2x10 Shouldr flex 3lb 2x10 Standing shoulder Ext 10lb 2x10 Standing shoulder Ext 10lb 2x10 Triceps Ext ext 20lb 2x10 Biceps Curls 10lb 2x10  MHP R shoulder x10 min   01/03/23 Physical Re-assessment due to continued C/O pain STM and joint mobs, inf and post glide, Grade lll 2 x 10 Scapular mobilizations Prone sh ext, T, Y x 6 each with 5 sec hold. Practiced performing correctly, required mod V  and min TC to not pull too hard and hike shoulders Doorway stretch for B shoulders, linked to deep breathing to release tension Spurling's neg B.  01/01/23 UBE L2 x 3 min each AAROM  2lb WaTE flex, Ext, IR  RUE ER yellow x10, Red x10 RUE IR red 2x12 Shoulder Flex 2lb 2x10 Shoulder abd 2lb 2x10 MH/IFC cerv and rt shld 10 min in sitting at pt request  12/25/22 UBE L 3 3 min fwd/3 min back RT shld joint mobs and capsule stretch. PROM FULL. AROM 160 degrees flexion, ABD 110 Red tband shld ext,row, abd and ER 15 x each Standing back to wall flex flex,abd, horz abd 10 x 2# each 2# PNF and empty can 2# 15x each Red tband 3 pt stab on wall 10 BIL Finger ladder 5 x flex and abd with eccentric lowering MH/IFC cerv and rt shld 10 min in sitting at pt request Stopped ionto as she had 4 patches with NE  12/20/22 UBE L3 x 2.5 min each way STM to R pects, supraspinatus F/B stretch R humerus joint  mobilization inferior and post glide Patient led stretch including holding doorframe with ER and doorway stretch for pects. Shoulder/scap strengthening, wall slides- flexion x 10, tried scaption, but caused impingement pain, so deferred. Wall stars against red tband resistance, 5 reps each arm Seated row with 15# resistance, 2 x 10 reps. Vaso to R shoulder med pressure, 34 degrees, 10 minutes Ionto patch to R shoulder, 1.2 cc Dextro  12/18/22 UBE L 3 2 min each way Seated row and lats 20# 2 sets 10 Cable pulley shld ext 5# 2 sets 10 3# empty can 2 sets 10 3# PNF 2 sets 10 Yellow tband 3 point scap stab BIL 10 each IFC/MH to RT shld Ionto RT shld 1.2 cc dex    12/13/22 UBE L 1 x 2 min forward/2 min backwards, stopped at 3 min due to post rotation hurting STM to upper traps, pects on R Shoulder joint mobs into inferior and post glide  Manual cervical traction x 60 sec Scapular mobilization into depression Wall stars- yellow Tband, painful to move R hand, so stabilized with R while moving L  hand Wall slides- towel on wall, 10 flex, 10 scaption on R. VASO to R shoulder 10 min, 34 mod pressure Ionto patch to R shoulder 1.2 cc Dextro  12/07/22 UBE L 1 1.5 min fwd/1.5 min backward Yellow tband shld ext,row and ER 10x  Cerv retraction head on ball on wall hold 3 sec 10 x 2# empty can 10 x ER 2# 10x at side and at 90 degrees abd PROM RT shld , jt mobs and capsular stretch IFC/MH to RT shld Ionto RT shld 1.2 cc dex   12/04/22 Education, HEP      PATIENT EDUCATION:  Education details: POC HEP Person educated: Patient Education method: Consulting civil engineer, Media planner, and Handouts Education comprehension: verbalized understanding and returned demonstration   HOME EXERCISE PROGRAM: [id number]   ASSESSMENT:   CLINICAL IMPRESSION: Patient reports that her sister is looking after her Father, which is helping with her physical and emotional stress. Her L up traps has a large TP. Therapist performed STM to the TP and stretch to shoulder ROM/Stretch. Progressed with upper and lower body strengthening. She tolerated all exercises with no C/O pain.  OBJECTIVE IMPAIRMENTS: decreased coordination, decreased ROM, decreased strength, hypomobility, increased fascial restrictions, increased muscle spasms, improper body mechanics, postural dysfunction, and pain.    ACTIVITY LIMITATIONS: carrying, lifting, sleeping, reach over head, and caring for others   PARTICIPATION LIMITATIONS: cleaning, laundry, shopping, and community activity   PERSONAL FACTORS: Past/current experiences are also affecting patient's functional outcome.    REHAB POTENTIAL: Good   CLINICAL DECISION MAKING: Evolving/moderate complexity   EVALUATION COMPLEXITY: Moderate     GOALS: Goals reviewed with patient? Yes   SHORT TERM GOALS: Target date: 12/22/22   I with initial HEP Baseline: Goal status: 12/07/22 MET   LONG TERM GOALS: Target date: 02/26/23   I with final HEP Baseline:  Goal status: on going  12/18/22 evolving 01/22/23    2.  Decrease shoulder pain with activity to <4/10 Baseline: 10/10 Goal status: ongoing 12/18/22 01/22/23 partially met pain with certain mvmt/actvity ( whisking and beating with cooking) hair and washing under arm better. Grocery lifting is hard, 02/04/23- Patient reports numbness. It is no longer waking her up unless she sleeps on it. Max of 4-5, ongoing   3.  Increase R shoulder strength to 4/5, including scapular stabilizers. Baseline: 3/5 Goal status: ongoing 12/18/22. 12/25/22 ongoing fatigues quickly. 01/22/23 progressing  4.  Patient will achieve at least 120 degrees of R shoulder abd, with pain< 4/10 Baseline:  Goal status: 12/18/22 RT shld abd sitting 121 pain 8/10  12/25/22 abd 110 with increased pain 01/22/23 met   PLAN:   PT FREQUENCY: 2x/week   PT DURATION: 12 weeks   PLANNED INTERVENTIONS: Therapeutic exercises, Therapeutic activity, Neuromuscular re-education, Balance training, Gait training, Patient/Family education, Self Care, Joint mobilization, Dry Needling, Electrical stimulation, Cryotherapy, Moist heat, Ionotophoresis 16m/ml Dexamethasone, and Manual therapy.   PLAN FOR NEXT SESSION: progress with ROM and strengthening for shoulder and trunk            Patient Details  Name: MJanyiah ShureMRN: [id number] of Birth: [birth year]Referring Provider:  OBenito Mccreedy MD  SEthel RanaDPT 02/04/23 4:30 PM  CNipinnaw[place]eat [place] [address]Phone: [phone]  Fax:  [phone]

## 2023-02-06 DIAGNOSIS — L600 Ingrowing nail: Secondary | ICD-10-CM | POA: Diagnosis not present

## 2023-02-07 ENCOUNTER — Encounter: Payer: Self-pay | Admitting: Physical Therapy

## 2023-02-07 ENCOUNTER — Ambulatory Visit: Payer: 59 | Admitting: Physical Therapy

## 2023-02-07 ENCOUNTER — Other Ambulatory Visit: Payer: Self-pay | Admitting: Cardiology

## 2023-02-07 DIAGNOSIS — G8929 Other chronic pain: Secondary | ICD-10-CM

## 2023-02-07 DIAGNOSIS — R262 Difficulty in walking, not elsewhere classified: Secondary | ICD-10-CM

## 2023-02-07 DIAGNOSIS — M542 Cervicalgia: Secondary | ICD-10-CM | POA: Diagnosis not present

## 2023-02-07 DIAGNOSIS — M5416 Radiculopathy, lumbar region: Secondary | ICD-10-CM | POA: Diagnosis not present

## 2023-02-07 DIAGNOSIS — R252 Cramp and spasm: Secondary | ICD-10-CM

## 2023-02-07 DIAGNOSIS — E782 Mixed hyperlipidemia: Secondary | ICD-10-CM

## 2023-02-07 DIAGNOSIS — M6281 Muscle weakness (generalized): Secondary | ICD-10-CM

## 2023-02-07 DIAGNOSIS — M25511 Pain in right shoulder: Secondary | ICD-10-CM | POA: Diagnosis not present

## 2023-02-07 DIAGNOSIS — I10 Essential (primary) hypertension: Secondary | ICD-10-CM

## 2023-02-07 NOTE — Therapy (Signed)
OUTPATIENT PHYSICAL THERAPY TREATMENT NOTE Progress Note Reporting Period 12/04/22 to 01/22/23  See note below for Objective Data and Assessment of Progress/Goals.      Patient Name: Shannon Rosario MRN: [id number] DOB:02-28-1961, 62 y.o., female Today's Date: 02/07/2023  PCP: see eval REFERRING PROVIDER: see eval  END OF SESSION:   PT End of Session - 02/07/23 1533     Visit Number 13    Date for PT Re-Evaluation 02/26/23    PT Start Time 1533    PT Stop Time 1613    PT Time Calculation (min) 40 min    Activity Tolerance Patient tolerated treatment well    Behavior During Therapy WFL for tasks assessed/performed               Past Medical History:  Diagnosis Date   Anxiety    Arthritis    Depression    Diabetes mellitus without complication ([place])    Family history of adverse reaction to anesthesia    My Mother had some complications,I dont remember what    Fibromyalgia    GERD (gastroesophageal reflux disease)    Heart murmur    Hypercholesterolemia    Hypertension    Obesity    OSA (obstructive sleep apnea)    TIA (transient ischemic attack) 12/24/2010   Past Surgical History:  Procedure Laterality Date   APPENDECTOMY     CARPAL [place] Left 1995   LEFT HEART CATH AND CORONARY ANGIOGRAPHY N/A 03/16/2021   Procedure: LEFT HEART CATH AND CORONARY ANGIOGRAPHY;  Surgeon: Nigel Mormon, MD;  Location: [facility];  Service: Cardiovascular;  Laterality: N/A;   [place]   Patient Active Problem List   Diagnosis Date Noted   Angina pectoris, unstable ([place]) 08/11/2021   Non-restorative sleep 08/11/2021   Unstable angina ([place]) 03/16/2021   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   Fire ant bite 10/22/2016   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/2016   Allergic urticaria 10/22/2016   Chronic seasonal  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 10/22/2016   Obstructive sleep apnea treated with continuous positive airway pressure (CPAP) 10/22/2016   Chest pain 09/21/2015   Essential hypertension 09/21/2015   Diabetes ([place]) 09/21/2015   Pain in the chest     REFERRING DIAG: cerv,shld and back  THERAPY DIAG:  Muscle weakness (generalized)  Chronic right shoulder pain  Cervicalgia  Lumbar radiculopathy  Difficulty in walking, not elsewhere classified  Cramp and spasm  Rationale for Evaluation and Treatment Rehabilitation  PERTINENT HISTORY: see note  PRECAUTIONS: none  OBJECTIVE: (objective measures completed at initial evaluation unless otherwise dated)  ONSET DATE: 11/29/22  SUBJECTIVE STATEMENT: Patient reports that her R side of neck is sore. She had a brief episode of pain in the neck, going up to her head, and down into her back after her last treatment. The soreness remains. PERTINENT HISTORY:  Patient reports she fell in February, sustaining injuries to L hip, lower back, L shoulder, neck. She received an injection in her L shoulder for pain and inflammation on 2/13. No hip injection per Dr decision.    PAIN:  Are you having pain? Yes: NPRS scale: 3/10 Pain location: R shoulder, distal  deltoid Pain description: pressure Aggravating factors: Sometimes raising up above her head, stirring a mixing bowl Relieving factors: Biofreeze   PRECAUTIONS: None   WEIGHT BEARING RESTRICTIONS: No   FALLS:  Has patient fallen in last 6 months? No   LIVING ENVIRONMENT: Lives with: lives with their family Lives in: House/apartment Stairs: Yes: Internal: 14 steps; on left going up and External: 1 steps; none Has following equipment at home: None   OCCUPATION: On disability   PLOF: Independent    PATIENT GOALS: Patient would like to have relief from shoulder pain and improve her ROM to normal.   NEXT MD VISIT:    OBJECTIVE:    DIAGNOSTIC FINDINGS:  Lumbar spine MRI ordered.     SCREENING FOR RED FLAGS: Bowel or bladder incontinence: No Spinal tumors: No Cauda equina syndrome: No Compression fracture: No Abdominal aneurysm: No   COGNITION: Overall cognitive status: Within functional limits for tasks assessed                          SENSATION: Not tested   POSTURE: decreased thoracic kyphosis   PALPATION: Tight and TTP along B cervical paraspinals, increased around upper traps and medial scapulae, tight pects, tight scalenes, TTP on deltoid insertion. R supraspinatus tendon and biceps tendons both with severe TTP   CERVICAL ROM:    AROM eval 12/18/22 01/22/23  Flexion 100% WFLs- radiating symptoms WFLS  Extension 100 Limited 25%-radiating pain  WFLS  Right lateral flexion 80    Left lateral flexion 80    Right rotation 100 Decreased 50% WFLS with pain  Left rotation 100 Dereased 25% WFLS   (Blank rows = not tested)   UPPER EXTREMITY ROM:   LUE WNL, R shoulder flexion WNL, but painful, abd to 90 degrees, painful 01/22/23 RT shld  flexion 180,abd 140,ER 90,IR 80   UPPER EXTREMITY MMT:  LUE WNL, R shoulder MMT limited due to pain with any resistance, elbow WFL     LUMBAR SPECIAL TESTS:  Deferred as patient now denies low back pain after receiving injections SHOULDER TESTS: Hawkins Test: (+) R                                     Painful arc test (+) on R                                    Empty Can Test (+) on R                                     Spurling's test (-) B. GAIT: Distance walked: In clinic distances Assistive device utilized: None Level of assistance: Complete  Independence Comments: No obvious gait deviations.   TODAY'S TREATMENT:                                                                                                                               DATE:  02/07/23 NuStep L5 x 6 minutes STM to R LS, cross friction to R long head biceps tendon, STM to R cervical paraspinals with gentle ant/post mobs at C4 and C5 grade ll, humeral depression and post glide mobs, grade lll. Humeral distraction in neutral shoulder ext. Passive stretch for shoulder ext, horizontal abd Patient performed seated self LS stretch followed by humeral self distraction, grasping mat table with RUE and leaning to L.  02/04/23 NuStep L5 x 6 minutes STM to R shoulder-Up traps, LS, followed by chin tucks,  Standing shoulder ext, rows against 15#, ER against G tband, 2 x 10 reps each Sit to stand from elevated mat on air ex, holding 4# ball in BUE, OHP at top x 10 Repeat on solid floor with G tband at knees, no OHP Side to side stepping with G tband at knees, 4 x 6 steps each direction Seated trunk ext against B Tband, 2 x 10 Lat pulls, 25#, 2 x 10 reps Heel raise/calf stretch, 2 x 10 reps.  01/24/23 NuStep L5 x 5 minutes STM to up traps, pects on R, R humeral joint mobs, soft tissue stretch AAROM in supine with dowel for flexion and abd, 10 each PR humeral depression in sitting Wall slides into flexion and scaption x 10 each Standing shoulder ext, rows with 10# resistance, 2 x 10 reps. B shoulder ER against G tband resistance, 2 x 10 reps MH to L shoulder x 10 minutes for pain relief   01/22/23 UBE L 4 3 min fwd/3 min backward Lat pull 25# 2 sets 10 Seated row 25# 2 sets 10 Chest press with serratus 15# 5 x then decreased to 10# 5 x then 10 x Shld abd standing with red tband 2 sets 10 Red tband ER 2 sets 10 Tricep ext 20# 2 sets 10 PROM and STW to RT shld to increase ROM painful motion esp abd DN to rt shld to release TP by Michele Mcalpine PT MH 10 min RT shld  01/15/23 NuStep L5 x 6 minutes. STM to R pects F/B stretch Scapular and humeral joint mobs PROM for R shoulder flexion and abd, IR/ER Standing shoulder abd, flex, ext with 3# R, 4# L, 2 x 10  each Seated rows and lat pulls, 25#, 2 x 10 each MH to R shoulder x 10 minutes   PATIENT EDUCATION:  Education details: POC HEP Person educated: Patient Education method: Consulting civil engineer, Media planner, and Handouts Education comprehension: verbalized understanding and returned demonstration   HOME EXERCISE PROGRAM: [id number]   ASSESSMENT:   CLINICAL IMPRESSION: Patient reports that her neck was painful after her last treatment. The pain subsided, but she is still sore along R side of neck,  LS. Long head of biceps tendon very TTP as well. Therapist provided extensive STM and stretch to area and then educated patient to self joint mobs for inferior humeral glide/distraction, and self stretch from neck to wrist. She returned demo and reported understanding of activity.  OBJECTIVE IMPAIRMENTS: decreased coordination, decreased ROM, decreased strength, hypomobility, increased fascial restrictions, increased muscle spasms, improper body mechanics, postural dysfunction, and pain.    ACTIVITY LIMITATIONS: carrying, lifting, sleeping, reach over head, and caring for others   PARTICIPATION LIMITATIONS: cleaning, laundry, shopping, and community activity   PERSONAL FACTORS: Past/current experiences are also affecting patient's functional outcome.    REHAB POTENTIAL: Good   CLINICAL DECISION MAKING: Evolving/moderate complexity   EVALUATION COMPLEXITY: Moderate     GOALS: Goals reviewed with patient? Yes   SHORT TERM GOALS: Target date: 12/22/22   I with initial HEP Baseline: Goal status: 12/07/22 MET   LONG TERM GOALS: Target date: 02/26/23   I with final HEP Baseline:  Goal status: on going 12/18/22 evolving 01/22/23    2.  Decrease shoulder pain with activity to <4/10 Baseline: 10/10 Goal status: ongoing 12/18/22 01/22/23 partially met pain with certain mvmt/actvity ( whisking and beating with cooking) hair and washing under arm better. Grocery lifting is hard, 02/04/23- Patient  reports numbness. It is no longer waking her up unless she sleeps on it. Max of 4-5, ongoing   3.  Increase R shoulder strength to 4/5, including scapular stabilizers. Baseline: 3/5 Goal status: ongoing 12/18/22. 12/25/22 ongoing fatigues quickly. 01/22/23 progressing   4.  Patient will achieve at least 120 degrees of R shoulder abd, with pain< 4/10 Baseline:  Goal status: 12/18/22 RT shld abd sitting 121 pain 8/10  12/25/22 abd 110 with increased pain 01/22/23 met   PLAN:   PT FREQUENCY: 2x/week   PT DURATION: 12 weeks   PLANNED INTERVENTIONS: Therapeutic exercises, Therapeutic activity, Neuromuscular re-education, Balance training, Gait training, Patient/Family education, Self Care, Joint mobilization, Dry Needling, Electrical stimulation, Cryotherapy, Moist heat, Ionotophoresis 89m/ml Dexamethasone, and Manual therapy.   PLAN FOR NEXT SESSION: progress with ROM and strengthening for shoulder and trunk            Patient Details  Name: Shannon ValensuelaMRN: [id number] of Birth: 108/06/1962Referring Provider:  OBenito Mccreedy MD  SEthel RanaDPT 02/07/23 4:20 PM  CL[place]at [place] [id number]: [phone]  Fax:  [phone]

## 2023-02-08 DIAGNOSIS — E1129 Type 2 diabetes mellitus with other diabetic kidney complication: Secondary | ICD-10-CM | POA: Diagnosis not present

## 2023-02-08 DIAGNOSIS — I10 Essential (primary) hypertension: Secondary | ICD-10-CM | POA: Diagnosis not present

## 2023-02-08 DIAGNOSIS — R809 Proteinuria, unspecified: Secondary | ICD-10-CM | POA: Diagnosis not present

## 2023-02-08 DIAGNOSIS — E1142 Type 2 diabetes mellitus with diabetic polyneuropathy: Secondary | ICD-10-CM | POA: Diagnosis not present

## 2023-02-08 DIAGNOSIS — E782 Mixed hyperlipidemia: Secondary | ICD-10-CM | POA: Diagnosis not present

## 2023-02-11 ENCOUNTER — Encounter: Payer: Self-pay | Admitting: Physical Therapy

## 2023-02-11 ENCOUNTER — Ambulatory Visit: Payer: 59 | Admitting: Physical Therapy

## 2023-02-11 DIAGNOSIS — M5416 Radiculopathy, lumbar region: Secondary | ICD-10-CM | POA: Diagnosis not present

## 2023-02-11 DIAGNOSIS — R252 Cramp and spasm: Secondary | ICD-10-CM

## 2023-02-11 DIAGNOSIS — M6281 Muscle weakness (generalized): Secondary | ICD-10-CM

## 2023-02-11 DIAGNOSIS — M542 Cervicalgia: Secondary | ICD-10-CM | POA: Diagnosis not present

## 2023-02-11 DIAGNOSIS — G8929 Other chronic pain: Secondary | ICD-10-CM

## 2023-02-11 DIAGNOSIS — M25511 Pain in right shoulder: Secondary | ICD-10-CM | POA: Diagnosis not present

## 2023-02-11 DIAGNOSIS — R262 Difficulty in walking, not elsewhere classified: Secondary | ICD-10-CM

## 2023-02-11 NOTE — Therapy (Signed)
OUTPATIENT PHYSICAL THERAPY TREATMENT NOTE Progress Note Reporting Period 12/04/22 to 01/22/23  See note below for Objective Data and Assessment of Progress/Goals.      Patient Name: Shannon Rosario MRN: [id number] DOB:1961/04/03, 62 y.o., female Today's Date: 02/11/2023  PCP: see eval REFERRING PROVIDER: see eval  END OF SESSION:   PT End of Session - 02/11/23 1552     Visit Number 14    Date for PT Re-Evaluation 02/26/23    PT Start Time 1550    PT Stop Time 1630    PT Time Calculation (min) 40 min    Activity Tolerance Patient tolerated treatment well    Behavior During Therapy WFL for tasks assessed/performed                Past Medical History:  Diagnosis Date   Anxiety    Arthritis    Depression    Diabetes mellitus without complication ([place])    Family history of adverse reaction to anesthesia    My Mother had some complications,I dont remember what    Fibromyalgia    GERD (gastroesophageal reflux disease)    Heart murmur    Hypercholesterolemia    Hypertension    Obesity    OSA (obstructive sleep apnea)    TIA (transient ischemic attack) 12/24/2010   Past Surgical History:  Procedure Laterality Date   [place] Left 1995   LEFT HEART CATH AND CORONARY ANGIOGRAPHY N/A 03/16/2021   Procedure: LEFT HEART CATH AND CORONARY ANGIOGRAPHY;  Surgeon: Nigel Mormon, MD;  Location: [facility];  Service: Cardiovascular;  Laterality: N/A;   [facility]   Patient Active Problem List   Diagnosis Date Noted   Angina pectoris, unstable ([place]) 08/11/2021   Non-restorative sleep 08/11/2021   Unstable angina ([place]) 03/16/2021   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   Fire ant bite 10/22/2016   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/2016   Allergic urticaria 10/22/2016   Chronic seasonal  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 10/22/2016   Obstructive sleep apnea treated with continuous positive airway pressure (CPAP) 10/22/2016   Chest pain 09/21/2015   Essential hypertension 09/21/2015   Diabetes ([place]) 09/21/2015   Pain in the chest     REFERRING DIAG: cerv,shld and back  THERAPY DIAG:  Muscle weakness (generalized)  Chronic right shoulder pain  Cervicalgia  Cramp and spasm  Difficulty in walking, not elsewhere classified  Lumbar radiculopathy  Rationale for Evaluation and Treatment Rehabilitation  PERTINENT HISTORY: see note  PRECAUTIONS: none  OBJECTIVE: (objective measures completed at initial evaluation unless otherwise dated)  ONSET DATE: 11/29/22  SUBJECTIVE STATEMENT: Patient reports that her R side of neck is sore. She had a brief episode of pain in the neck, going up to her head, and down into her back after her last treatment. The soreness remains. PERTINENT HISTORY:  Patient reports she fell in February, sustaining injuries to L hip, lower back, L shoulder, neck. She received an injection in her L shoulder for pain and inflammation on 2/13. No hip injection per Dr decision.    PAIN:  Are you having pain? Yes: NPRS scale: 3/10 Pain location: R shoulder, distal  deltoid Pain description: pressure Aggravating factors: Sometimes raising up above her head, stirring a mixing bowl Relieving factors: Biofreeze   PRECAUTIONS: None   WEIGHT BEARING RESTRICTIONS: No   FALLS:  Has patient fallen in last 6 months? No   LIVING ENVIRONMENT: Lives with: lives with their family Lives in: House/apartment Stairs: Yes: Internal: 14 steps; on left going up and External: 1 steps; none Has following equipment at home: None   OCCUPATION: On disability   PLOF: Independent    PATIENT GOALS: Patient would like to have relief from shoulder pain and improve her ROM to normal.   NEXT MD VISIT:    OBJECTIVE:    DIAGNOSTIC FINDINGS:  Lumbar spine MRI ordered.     SCREENING FOR RED FLAGS: Bowel or bladder incontinence: No Spinal tumors: No Cauda equina syndrome: No Compression fracture: No Abdominal aneurysm: No   COGNITION: Overall cognitive status: Within functional limits for tasks assessed                          SENSATION: Not tested   POSTURE: decreased thoracic kyphosis   PALPATION: Tight and TTP along B cervical paraspinals, increased around upper traps and medial scapulae, tight pects, tight scalenes, TTP on deltoid insertion. R supraspinatus tendon and biceps tendons both with severe TTP   CERVICAL ROM:    AROM eval 12/18/22 01/22/23  Flexion 100% WFLs- radiating symptoms WFLS  Extension 100 Limited 25%-radiating pain  WFLS  Right lateral flexion 80    Left lateral flexion 80    Right rotation 100 Decreased 50% WFLS with pain  Left rotation 100 Dereased 25% WFLS   (Blank rows = not tested)   UPPER EXTREMITY ROM:   LUE WNL, R shoulder flexion WNL, but painful, abd to 90 degrees, painful 01/22/23 RT shld  flexion 180,abd 140,ER 90,IR 80   UPPER EXTREMITY MMT:  LUE WNL, R shoulder MMT limited due to pain with any resistance, elbow WFL     LUMBAR SPECIAL TESTS:  Deferred as patient now denies low back pain after receiving injections SHOULDER TESTS: Hawkins Test: (+) R                                     Painful arc test (+) on R                                    Empty Can Test (+) on R                                     Spurling's test (-) B. GAIT: Distance walked: In clinic distances Assistive device utilized: None Level of assistance: Complete  Independence Comments: No obvious gait deviations.   TODAY'S TREATMENT:                                                                                                                               DATE:  02/11/23 NuStep L5 x 6 minutes DN performed by Roma Schanz, PT to R shoulder for TP release. Standing wall angels with back to wall. Self stretch to rhomboids, LS and UT after DN 3 way shoulder elevation with 3# weights x 10 each direction. Standing prone lean over physioball, B T, Y, I x 10 with 1# weight in BUE, emphasizing scapular retraction. CP to R shoulder for anti inflammatory and pain relief x 10 minutes.  02/07/23 NuStep L5 x 6 minutes STM to R LS, cross friction to R long head biceps tendon, STM to R cervical paraspinals with gentle ant/post mobs at C4 and C5 grade ll, humeral depression and post glide mobs, grade lll. Humeral distraction in neutral shoulder ext. Passive stretch for shoulder ext, horizontal abd Patient performed seated self LS stretch followed by humeral self distraction, grasping mat table with RUE and leaning to L.  02/04/23 NuStep L5 x 6 minutes STM to R shoulder-Up traps, LS, followed by chin tucks,  Standing shoulder ext, rows against 15#, ER against G tband, 2 x 10 reps each Sit to stand from elevated mat on air ex, holding 4# ball in BUE, OHP at top x 10 Repeat on solid floor with G tband at knees, no OHP Side to side stepping with G tband at knees, 4 x 6 steps each direction Seated trunk ext against B Tband, 2 x 10 Lat pulls, 25#, 2 x 10 reps Heel raise/calf stretch, 2 x 10 reps.  01/24/23 NuStep L5 x 5 minutes STM to up traps, pects on R, R humeral joint mobs, soft tissue stretch AAROM in supine with dowel for flexion and abd, 10 each PR humeral depression in sitting Wall slides into flexion and scaption x 10 each Standing shoulder ext, rows with 10# resistance, 2 x 10 reps. B shoulder ER against G tband resistance, 2 x 10 reps MH to L shoulder x 10 minutes for pain relief   01/22/23 UBE L 4 3 min fwd/3 min backward Lat pull 25# 2 sets 10 Seated row 25# 2 sets 10 Chest press with serratus 15# 5 x then decreased to 10# 5 x  then 10 x Shld abd standing with red tband 2 sets 10 Red tband ER 2 sets 10 Tricep ext 20# 2 sets 10 PROM and STW to RT shld to increase ROM painful motion esp abd DN to rt shld to release TP by Michele Mcalpine PT MH 10 min RT shld  01/15/23 NuStep L5 x 6 minutes. STM to R pects F/B stretch Scapular and humeral joint mobs PROM for R shoulder flexion and abd, IR/ER Standing shoulder abd, flex, ext with 3# R, 4# L, 2 x 10 each Seated  rows and lat pulls, 25#, 2 x 10 each MH to R shoulder x 10 minutes   PATIENT EDUCATION:  Education details: POC HEP Person educated: Patient Education method: Consulting civil engineer, Demonstration, and Handouts Education comprehension: verbalized understanding and returned demonstration   HOME EXERCISE PROGRAM: [id number]   ASSESSMENT:   CLINICAL IMPRESSION: Patient states that her R shoulder felt tight the past couple of days. She has been stretching a lot and it finally feels a bit more loose today. Provided DN to 3 TP- R UT, Rhomboid, LS, followd by passive self stretch and then strengthening to same area.   OBJECTIVE IMPAIRMENTS: decreased coordination, decreased ROM, decreased strength, hypomobility, increased fascial restrictions, increased muscle spasms, improper body mechanics, postural dysfunction, and pain.    ACTIVITY LIMITATIONS: carrying, lifting, sleeping, reach over head, and caring for others   PARTICIPATION LIMITATIONS: cleaning, laundry, shopping, and community activity   PERSONAL FACTORS: Past/current experiences are also affecting patient's functional outcome.    REHAB POTENTIAL: Good   CLINICAL DECISION MAKING: Evolving/moderate complexity   EVALUATION COMPLEXITY: Moderate     GOALS: Goals reviewed with patient? Yes   SHORT TERM GOALS: Target date: 12/22/22   I with initial HEP Baseline: Goal status: 12/07/22 MET   LONG TERM GOALS: Target date: 02/26/23   I with final HEP    Baseline:  Goal status: on going 12/18/22 evolving  01/22/23    2.  Decrease shoulder pain with activity to <4/10 Baseline: 10/10 Goal status: ongoing 12/18/22 01/22/23 partially met pain with certain mvmt/actvity ( whisking and beating with cooking) hair and washing under arm better. Grocery lifting is hard, 02/04/23- Patient reports numbness. It is no longer waking her up unless she sleeps on it. Max of 4-5, ongoing   3.  Increase R shoulder strength to 4/5, including scapular stabilizers. Baseline: 3/5 Goal status: ongoing 12/18/22. 12/25/22 ongoing fatigues quickly. 01/22/23 progressing 02/11/23-  4-/5 R shoulder and scap strength, ongoing.   4.  Patient will achieve at least 120 degrees of R shoulder abd, with pain< 4/10 Baseline:  Goal status: 12/18/22 RT shld abd sitting 121 pain 8/10  12/25/22 abd 110 with increased pain 01/22/23 met   PLAN:   PT FREQUENCY: 2x/week   PT DURATION: 12 weeks   PLANNED INTERVENTIONS: Therapeutic exercises, Therapeutic activity, Neuromuscular re-education, Balance training, Gait training, Patient/Family education, Self Care, Joint mobilization, Dry Needling, Electrical stimulation, Cryotherapy, Moist heat, Ionotophoresis 57m/ml Dexamethasone, and Manual therapy.   PLAN FOR NEXT SESSION: progress with ROM and strengthening for shoulder and trunk            Patient Details  Name: MEulanda CantlonMRN: [id number] of Birth: 111-24-1962Referring Provider:  OBenito Mccreedy MD  SEthel RanaDPT 02/11/23 4:36 PM  [facility] [place] [id number]: [phone]  Fax:  [phone]

## 2023-02-19 ENCOUNTER — Ambulatory Visit: Payer: 59 | Admitting: Physical Therapy

## 2023-02-21 ENCOUNTER — Ambulatory Visit: Payer: 59 | Admitting: Physical Therapy

## 2023-03-03 ENCOUNTER — Other Ambulatory Visit: Payer: Self-pay | Admitting: Cardiology

## 2023-03-03 DIAGNOSIS — E782 Mixed hyperlipidemia: Secondary | ICD-10-CM

## 2023-03-07 DIAGNOSIS — R3915 Urgency of urination: Secondary | ICD-10-CM | POA: Diagnosis not present

## 2023-03-07 DIAGNOSIS — R350 Frequency of micturition: Secondary | ICD-10-CM | POA: Diagnosis not present

## 2023-03-07 DIAGNOSIS — Z0189 Encounter for other specified special examinations: Secondary | ICD-10-CM | POA: Diagnosis not present

## 2023-03-22 DIAGNOSIS — I2510 Atherosclerotic heart disease of native coronary artery without angina pectoris: Secondary | ICD-10-CM | POA: Diagnosis not present

## 2023-03-22 DIAGNOSIS — M8580 Other specified disorders of bone density and structure, unspecified site: Secondary | ICD-10-CM | POA: Diagnosis not present

## 2023-03-22 DIAGNOSIS — E1165 Type 2 diabetes mellitus with hyperglycemia: Secondary | ICD-10-CM | POA: Diagnosis not present

## 2023-04-01 DIAGNOSIS — Z1231 Encounter for screening mammogram for malignant neoplasm of breast: Secondary | ICD-10-CM | POA: Diagnosis not present

## 2023-04-04 ENCOUNTER — Ambulatory Visit: Payer: Medicare Other | Admitting: Cardiology

## 2023-05-12 ENCOUNTER — Other Ambulatory Visit: Payer: Self-pay | Admitting: Cardiology

## 2023-05-12 DIAGNOSIS — I10 Essential (primary) hypertension: Secondary | ICD-10-CM

## 2023-05-13 DIAGNOSIS — E782 Mixed hyperlipidemia: Secondary | ICD-10-CM | POA: Diagnosis not present

## 2023-05-13 DIAGNOSIS — R809 Proteinuria, unspecified: Secondary | ICD-10-CM | POA: Diagnosis not present

## 2023-05-13 DIAGNOSIS — E1129 Type 2 diabetes mellitus with other diabetic kidney complication: Secondary | ICD-10-CM | POA: Diagnosis not present

## 2023-05-13 DIAGNOSIS — Z0001 Encounter for general adult medical examination with abnormal findings: Secondary | ICD-10-CM | POA: Diagnosis not present

## 2023-05-13 DIAGNOSIS — E1142 Type 2 diabetes mellitus with diabetic polyneuropathy: Secondary | ICD-10-CM | POA: Diagnosis not present

## 2023-05-13 DIAGNOSIS — I10 Essential (primary) hypertension: Secondary | ICD-10-CM | POA: Diagnosis not present

## 2023-07-25 DIAGNOSIS — E782 Mixed hyperlipidemia: Secondary | ICD-10-CM | POA: Diagnosis not present

## 2023-07-25 DIAGNOSIS — R809 Proteinuria, unspecified: Secondary | ICD-10-CM | POA: Diagnosis not present

## 2023-07-25 DIAGNOSIS — E1129 Type 2 diabetes mellitus with other diabetic kidney complication: Secondary | ICD-10-CM | POA: Diagnosis not present

## 2023-07-25 DIAGNOSIS — I10 Essential (primary) hypertension: Secondary | ICD-10-CM | POA: Diagnosis not present

## 2023-07-25 DIAGNOSIS — B349 Viral infection, unspecified: Secondary | ICD-10-CM | POA: Diagnosis not present

## 2023-07-25 DIAGNOSIS — E1142 Type 2 diabetes mellitus with diabetic polyneuropathy: Secondary | ICD-10-CM | POA: Diagnosis not present

## 2023-07-30 DIAGNOSIS — L299 Pruritus, unspecified: Secondary | ICD-10-CM | POA: Diagnosis not present

## 2023-08-22 DIAGNOSIS — Z7984 Long term (current) use of oral hypoglycemic drugs: Secondary | ICD-10-CM | POA: Diagnosis not present

## 2023-08-22 DIAGNOSIS — I10 Essential (primary) hypertension: Secondary | ICD-10-CM | POA: Diagnosis not present

## 2023-08-22 DIAGNOSIS — E782 Mixed hyperlipidemia: Secondary | ICD-10-CM | POA: Diagnosis not present

## 2023-08-22 DIAGNOSIS — E119 Type 2 diabetes mellitus without complications: Secondary | ICD-10-CM | POA: Diagnosis not present

## 2023-09-02 DIAGNOSIS — E1142 Type 2 diabetes mellitus with diabetic polyneuropathy: Secondary | ICD-10-CM | POA: Diagnosis not present

## 2023-09-02 DIAGNOSIS — Z0001 Encounter for general adult medical examination with abnormal findings: Secondary | ICD-10-CM | POA: Diagnosis not present

## 2023-09-02 DIAGNOSIS — E782 Mixed hyperlipidemia: Secondary | ICD-10-CM | POA: Diagnosis not present

## 2023-09-02 DIAGNOSIS — E1129 Type 2 diabetes mellitus with other diabetic kidney complication: Secondary | ICD-10-CM | POA: Diagnosis not present

## 2023-09-02 DIAGNOSIS — I10 Essential (primary) hypertension: Secondary | ICD-10-CM | POA: Diagnosis not present

## 2023-09-02 DIAGNOSIS — H9201 Otalgia, right ear: Secondary | ICD-10-CM | POA: Diagnosis not present

## 2023-09-02 DIAGNOSIS — R809 Proteinuria, unspecified: Secondary | ICD-10-CM | POA: Diagnosis not present

## 2023-09-05 ENCOUNTER — Other Ambulatory Visit: Payer: Self-pay

## 2023-09-05 ENCOUNTER — Emergency Department (HOSPITAL_COMMUNITY): Payer: 59

## 2023-09-05 ENCOUNTER — Emergency Department (HOSPITAL_COMMUNITY)
Admission: EM | Admit: 2023-09-05 | Discharge: 2023-09-05 | Disposition: A | Discharge status: Home / Self Care | Payer: 59 | Attending: Emergency Medicine | Admitting: Emergency Medicine

## 2023-09-05 ENCOUNTER — Encounter (HOSPITAL_COMMUNITY): Payer: Self-pay | Admitting: Emergency Medicine

## 2023-09-05 DIAGNOSIS — E119 Type 2 diabetes mellitus without complications: Secondary | ICD-10-CM | POA: Diagnosis not present

## 2023-09-05 DIAGNOSIS — I2510 Atherosclerotic heart disease of native coronary artery without angina pectoris: Secondary | ICD-10-CM | POA: Insufficient documentation

## 2023-09-05 DIAGNOSIS — J984 Other disorders of lung: Secondary | ICD-10-CM | POA: Diagnosis not present

## 2023-09-05 DIAGNOSIS — R0789 Other chest pain: Secondary | ICD-10-CM | POA: Diagnosis not present

## 2023-09-05 DIAGNOSIS — R591 Generalized enlarged lymph nodes: Secondary | ICD-10-CM | POA: Diagnosis not present

## 2023-09-05 DIAGNOSIS — R000 Tachycardia, unspecified: Secondary | ICD-10-CM | POA: Diagnosis not present

## 2023-09-05 DIAGNOSIS — Z7984 Long term (current) use of oral hypoglycemic drugs: Secondary | ICD-10-CM | POA: Insufficient documentation

## 2023-09-05 DIAGNOSIS — R079 Chest pain, unspecified: Secondary | ICD-10-CM | POA: Insufficient documentation

## 2023-09-05 DIAGNOSIS — I10 Essential (primary) hypertension: Secondary | ICD-10-CM | POA: Diagnosis not present

## 2023-09-05 DIAGNOSIS — E1165 Type 2 diabetes mellitus with hyperglycemia: Secondary | ICD-10-CM

## 2023-09-05 DIAGNOSIS — R609 Edema, unspecified: Secondary | ICD-10-CM | POA: Diagnosis not present

## 2023-09-05 DIAGNOSIS — Z79899 Other long term (current) drug therapy: Secondary | ICD-10-CM | POA: Diagnosis not present

## 2023-09-05 DIAGNOSIS — Z7982 Long term (current) use of aspirin: Secondary | ICD-10-CM | POA: Diagnosis not present

## 2023-09-05 LAB — CBC
HCT: 38.1 % (ref 36.0–46.0)
Hemoglobin: 12.3 g/dL (ref 12.0–15.0)
MCH: 24.6 pg — ABNORMAL LOW (ref 26.0–34.0)
MCHC: 32.3 g/dL (ref 30.0–36.0)
MCV: 76.2 fL — ABNORMAL LOW (ref 80.0–100.0)
Platelets: 264 10*3/uL (ref 150–400)
RBC: 5 MIL/uL (ref 3.87–5.11)
RDW: 14.6 % (ref 11.5–15.5)
WBC: 9.2 10*3/uL (ref 4.0–10.5)
nRBC: 0 % (ref 0.0–0.2)

## 2023-09-05 LAB — D-DIMER, QUANTITATIVE: D-Dimer, Quant: 0.95 ug{FEU}/mL — ABNORMAL HIGH (ref 0.00–0.50)

## 2023-09-05 LAB — BASIC METABOLIC PANEL
Anion gap: 16 — ABNORMAL HIGH (ref 5–15)
BUN: 8 mg/dL (ref 8–23)
CO2: 21 mmol/L — ABNORMAL LOW (ref 22–32)
Calcium: 9.1 mg/dL (ref 8.9–10.3)
Chloride: 99 mmol/L (ref 98–111)
Creatinine, Ser: 0.96 mg/dL (ref 0.44–1.00)
GFR, Estimated: >60 mL/min (ref >60)
Glucose, Bld: 200 mg/dL — ABNORMAL HIGH (ref 70–99)
Potassium: 3.9 mmol/L (ref 3.5–5.1)
Sodium: 136 mmol/L (ref 135–145)

## 2023-09-05 LAB — TROPONIN I (HIGH SENSITIVITY)
Troponin I (High Sensitivity): 5 ng/L (ref <18)
Troponin I (High Sensitivity): 4 ng/L (ref <18)

## 2023-09-05 MED ORDER — IOHEXOL 350 MG/ML SOLN
75.0000 mL | Freq: Once | INTRAVENOUS | Status: AC | PRN
  Administered 2023-09-05: 75 mL via INTRAVENOUS

## 2023-09-05 MED ORDER — LOSARTAN POTASSIUM 50 MG PO TABS
50.0000 mg | ORAL_TABLET | Freq: Every evening | ORAL | 0 refills | Status: AC
Start: 2023-09-05 — End: ?

## 2023-09-05 MED ORDER — NITROGLYCERIN 0.4 MG SL SUBL: 0.4000 mg | SUBLINGUAL_TABLET | SUBLINGUAL | 0 refills | Status: AC | PRN

## 2023-09-05 NOTE — ED Provider Notes (Signed)
[facility] Provider Note   CSN: [id number] Arrival date & time: 09/05/23  4098     History  Chief Complaint  Patient presents with   Chest Pain    Shannon Rosario is a 62 y.o. female.  Shannon Rosario is a 62 y.o. female with a history of hypertension, diabetes, hyperlipidemia, CAD, TIA, sleep apnea, obesity, who presents to the emergency department via EMS for evaluation of chest pain.  Chest pain started about 4 hours prior to arrival, she reports she was laying on the couch when she started feeling the pain in her left upper chest and in her right arm.  She describes it as feeling like her upper arm and chest were in a vice grip she tried to get up to go upstairs and go to bed and she reports that when walking up the stairs her pain seemed to get worse and she felt short of breath.  She denies any lightheadedness or dizziness.  When she called EMS she was instructed to chew an aspirin and when EMS arrived they gave her a nitro tablet she reports that this took her pain from a 10 to a 3.  She reports some diaphoresis while she was in the ambulance, denies nausea or vomiting.  No abdominal pain or pain in her back.  She has never had similar pain.  She does report that she has been under a lot of stress recently her granddaughter recently passed away and she drove to and from [place] to take her granddaughter's remains home.  She reports an extensive family history of heart issues, she has never had a heart attack that she is aware of, she is followed by Dr. Odis Hollingshead with cardiology. No smoking history.   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  Echo in 2023 showed normal EF.  The history is provided by the patient and medical records.  Chest Pain Associated symptoms: diaphoresis and shortness  of breath   Associated symptoms: no abdominal pain, no cough, no fever, no nausea, no palpitations and no vomiting        Home Medications Prior to Admission medications   Medication Sig Start Date End Date Taking? Authorizing Provider  aspirin EC 81 MG tablet Take 1 tablet (81 mg total) by mouth daily. Swallow whole. 10/05/21   Tolia, Sunit, DO  celecoxib (CELEBREX) 100 MG capsule Take 100 mg by mouth 2 (two) times daily. 03/26/22   [provider]  cholecalciferol (VITAMIN D) 1000 UNITS tablet Take 1,000 Units by mouth daily.    [provider]  cycloSPORINE (RESTASIS) 0.05 % ophthalmic emulsion 2 (two) times daily.    [provider]  EPINEPHrine 0.3 mg/0.3 mL IJ SOAJ injection Inject 0.3 mLs (0.3 mg total) into the muscle once as needed (anaphylaxis). 09/18/16   Horton, Mayer Masker, MD  glimepiride (AMARYL) 4 MG tablet Take 4 mg by mouth in the morning and at bedtime.    [provider]  hydrochlorothiazide (HYDRODIURIL) 25 MG tablet TAKE 1 TABLET (25 MG TOTAL) BY MOUTH IN THE MORNING 05/14/23 08/12/23  Tolia, Sunit, DO  levocetirizine (XYZAL) 5 MG tablet Take 5 mg by mouth daily.    [provider]  losartan (COZAAR) 25 MG tablet TAKE 1 TABLET BY MOUTH EVERY EVENING 12/29/21   Tolia, Sunit, DO  loteprednol (  LOTEMAX) 0.5 % ophthalmic suspension SMARTSIG:In Eye(s) 05/28/22   [provider]  magnesium oxide (MAG-OX) 400 (240 Mg) MG tablet TAKE 1 TABLET BY MOUTH TWICE A DAY 03/04/23   Tolia, Sunit, DO  metFORMIN (GLUCOPHAGE) 500 MG tablet Take 2 tablets by mouth 2 (two) times daily. 12/06/20   [provider]  metoprolol succinate (TOPROL XL) 25 MG 24 hr tablet Take 1 tablet (25 mg total) by mouth daily. 10/05/21 08/16/22  Tolia, Sunit, DO  nitroGLYCERIN (NITROSTAT) 0.4 MG SL tablet TAKE 1 TAB SUBLINGUALLY EVERY 5 MINUTES AS NEEDED 02/27/21   [provider]  omeprazole (PRILOSEC) 20 MG capsule Take 20 mg by mouth daily.    [provider]  rosuvastatin (CRESTOR) 10 MG tablet Take 10 mg by mouth daily. 12/06/20   [provider]  vitamin E 1000 UNIT capsule Take 1,000 Units by mouth daily.    [provider]      Allergies    Bee venom, Pineapple, and Dobutamine    Review of Systems   Review of Systems  Constitutional:  Positive for diaphoresis. Negative for chills and fever.  HENT: Negative.    Respiratory:  Positive for shortness of breath. Negative for cough.   Cardiovascular:  Positive for chest pain. Negative for palpitations and leg swelling.  Gastrointestinal:  Negative for abdominal pain, nausea and vomiting.  Genitourinary:  Negative for dysuria and frequency.  Musculoskeletal:  Negative for arthralgias and myalgias.  Neurological:  Negative for tremors and light-headedness.    Physical Exam Updated Vital Signs BP 125/76   Pulse 88   Temp 98.7 F (37.1 C) (Oral)   Resp 20   Ht 5\' 8"  (1.727 m)   Wt 111.6 kg   SpO2 97%   BMI 37.40 kg/m  Physical Exam Vitals and nursing note reviewed.  Constitutional:      General: She is not in acute distress.    Appearance: Normal appearance. She is well-developed. She is obese. She is not ill-appearing or diaphoretic.  HENT:     Head: Normocephalic and atraumatic.  Eyes:     General:        Right eye: No discharge.        Left eye: No discharge.     Pupils: Pupils are equal, round, and reactive to light.  Cardiovascular:     Rate and Rhythm: Normal rate and regular rhythm.     Pulses: Normal pulses.          Radial pulses are 2+ on the right side and 2+ on the left side.       Dorsalis pedis pulses are 2+ on the right side and 2+ on the left side.     Heart sounds: Normal heart sounds. No murmur heard.    No friction rub. No gallop.  Pulmonary:     Effort: Pulmonary effort is normal. No respiratory distress.     Breath sounds: Normal breath sounds. No wheezing or rales.     Comments: Respirations equal and unlabored,  patient able to speak in full sentences, lungs clear to auscultation bilaterally  Chest:     Chest wall: No tenderness.  Abdominal:     General: Bowel sounds are normal. There is no distension.     Palpations: Abdomen is soft. There is no mass.     Tenderness: There is no abdominal tenderness. There is no guarding.     Comments: Abdomen soft, nondistended, nontender to palpation in all quadrants without guarding or  peritoneal signs  Musculoskeletal:        General: No deformity.     Cervical back: Neck supple.     Right lower leg: No tenderness. No edema.     Left lower leg: No tenderness. No edema.  Skin:    General: Skin is warm and dry.     Capillary Refill: Capillary refill takes less than 2 seconds.  Neurological:     Mental Status: She is alert and oriented to person, place, and time.     Coordination: Coordination normal.     Comments: Speech is clear, able to follow commands Moves extremities without ataxia, coordination intact  Psychiatric:        Mood and Affect: Mood normal.        Behavior: Behavior normal.     ED Results / Procedures / Treatments   Labs (all labs ordered are listed, but only abnormal results are displayed) Labs Reviewed  BASIC METABOLIC PANEL - Abnormal; Notable for the following components:      Result Value   CO2 21 (*)    Glucose, Bld 200 (*)    Anion gap 16 (*)    All other components within normal limits  CBC - Abnormal; Notable for the following components:   MCV 76.2 (*)    MCH 24.6 (*)    All other components within normal limits  D-DIMER, QUANTITATIVE  TROPONIN I (HIGH SENSITIVITY)  TROPONIN I (HIGH SENSITIVITY)    EKG EKG Interpretation Date/Time:  Thursday September 05 2023 05:58:59 EDT Ventricular Rate:  90 PR Interval:  146 QRS Duration:  73 QT Interval:  351 QTC Calculation: 430 R Axis:   -9  Text Interpretation: Sinus rhythm Low voltage, precordial leads No significant change since last tracing Confirmed by Drema Pry [phone]) on 09/05/2023 6:15:22 AM  Radiology DG Chest 2 View  Result Date: 09/05/2023 CLINICAL DATA:  Chest pain radiating to the left arm with shortness of breath EXAM: CHEST - 2 VIEW COMPARISON:  03/15/2022 FINDINGS: Linear opacity in the peripheral left lung, stable and attributed to scarring. There is no edema, consolidation, effusion, or pneumothorax. Normal heart size and mediastinal contours. IMPRESSION: 1. No acute finding. 2. Left pulmonary scarring. Electronically Signed   By: Tiburcio Pea M.D.   On: 09/05/2023 06:52    Procedures Procedures    Medications Ordered in ED Medications - No data to display  ED Course/ Medical Decision Making/ A&P                                 Medical Decision Making Amount and/or Complexity of Data Reviewed Labs: ordered. Radiology: ordered.  Risk Prescription drug management.   Patient presents to the emergency department with chest pain. Patient nontoxic appearing, in no apparent distress, vitals without significant abnormality. Fairly benign physical exam.   DDX including but not limited to: ACS, pulmonary embolism, dissection, pneumothorax, pneumonia, arrhythmia, severe anemia, MSK, GERD, anxiety, abdominal process.   Additional history obtained:  Chart & nursing note reviewed.  Prior cardiology records reviewed  EKG: NSR without ischemic changes  Lab Tests:  I reviewed & interpreted labs including:  CBC without leukocytosis or anemia, glucose of 200, but no other significant electrolyte derangements.  Initial troponin within normal limits at 5, delta troponin and D-dimer pending Delta troponin reassuring at 4, D-dimer is elevated at 0.95, will proceed with CT angio chest   Imaging Studies ordered:  I  ordered and viewed the following imaging, agree with radiologist impression:  No acute cardiopulmonary disease CT angio obtained as well which fortunately does not show PE or other acute abnormalities, incidental finding  of pulmonary nodule  ED Course:   RE-EVAL: Patient has not had any return of severe chest pain  Heart Pathway Score 5 - EKG without obvious acute ischemia, delta troponin negative, doubt ACS. Patient is low risk wells, PERC negative, doubt pulmonary embolism. Pain is not a tearing sensation, symmetric pulses, no widening of mediastinum on CXR, doubt dissection. Cardiac monitor reviewed, no notable arrhythmias or tachycardia   Based on patient's chief complaint, I considered admission might be necessary, case discussed with cardiology. Dr. Odis Hollingshead reviewed patient's evaluation, reports that he feels it is reasonable for patient to go home and follow-up closely as an outpatient he recommends increasing her losartan from 25 mg daily to 50 mg and sending her home with as needed nitro.  I discussed this plan with the patient and she is eager to go home.  She will call Dr. Francesca Jewett office to set up follow-up today and I discussed strict return precautions.  I discussed results, treatment plan, need for cardiac J follow-up, and return precautions with the patient. Provided opportunity for questions, patient confirmed understanding and is in agreement with plan.     Portions of this note were generated with Scientist, clinical (histocompatibility and immunogenetics). Dictation errors may occur despite best attempts at proofreading.         Final Clinical Impression(s) / ED Diagnoses Final diagnoses:  Left-sided chest pain    Rx / DC Orders ED Discharge Orders          Ordered    losartan (COZAAR) 50 MG tablet  Every evening        09/05/23 1241    nitroGLYCERIN (NITROSTAT) 0.4 MG SL tablet  Every 5 min PRN        09/05/23 1241              Dartha Lodge, [place] 09/05/23 1441    Durwin Glaze, MD 09/05/23 [phone]

## 2023-09-05 NOTE — ED Notes (Signed)
PT to CT, well appearing upon transport.

## 2023-09-05 NOTE — ED Notes (Signed)
PT up to bathroom with stable gait.  

## 2023-09-05 NOTE — ED Notes (Signed)
Pt back from CT, NAD noted at this time. Pt denies any unmet needs.

## 2023-09-05 NOTE — ED Notes (Signed)
This RN reviewed discharge instructions with patient. She verbalized understanding and denied any further questions. PT well appearing upon discharge and reports tolerable pain. Pt ambulated with stable gait to exit. Pt endorses ride home.  

## 2023-09-05 NOTE — ED Notes (Signed)
Pt provided with warm blankets NAD  noted at this time.

## 2023-09-05 NOTE — ED Notes (Signed)
NAD noted at this time, pt resting in bed, respirations even and unlabored, skin warm and dry, bed in low position, call light within reach. Comfort measures offered. Will continue to monitor.  Provider at bedside

## 2023-09-05 NOTE — ED Notes (Signed)
Patient transported to X-ray 

## 2023-09-05 NOTE — ED Triage Notes (Signed)
Pt BIB EMS from home, pt reports that chest pain began about 4 hours ago. Pt was in the left side of the chest and radiated to the left arm, axillary area, pt was given nitroglycerin x 1 and Aspirin 324 mg, pt reports that pt went from 10 to 3, after meds also, been under stress due to family member  passing

## 2023-09-05 NOTE — ED Notes (Signed)
This RN assumed care of patient and received off going transfer of care report from off going RN. Pt presented to the ED with chest pain from home. Pt is resting on gurney at this time, respirations are spontaneous, even, unlabored and symmetrical bilaterally. Pt skin tone is appropriate for ethnicity, dry and warm. Pt connected to CCM, pulse ox and BP. Support person at bedside, call bell within reach and bed in lowest position.

## 2023-09-05 NOTE — Discharge Instructions (Addendum)
Your labs and imaging today are reassuring we do not see signs of heart attack.  Please call your cardiologist today to schedule close follow-up appointment.  I am increasing the dose of your losartan from 25 to 50 mg daily.  I have also prescribed sublingual nitro tablets, keep these with you and if you experience chest pain Place 1 tablet under the tongue, if chest pain is still present you can take a second tablet after 5 minutes.  If after 2 doses of nitro your chest pain is not relieved please call 911 and return to the emergency department.  If you develop any other new or worsening symptoms please come back to the emergency department for further evaluation.

## 2023-09-11 ENCOUNTER — Telehealth: Payer: Self-pay | Admitting: *Deleted

## 2023-09-11 NOTE — Progress Notes (Unsigned)
Care Coordination  Outreach Note  09/11/2023 Name: Shannon Rosario MRN: [id number] DOB: Dec 27, 1960   Care Coordination Outreach Attempts: An unsuccessful telephone outreach was attempted today to offer the patient information about available care coordination services.  Follow Up Plan:  Additional outreach attempts will be made to offer the patient care coordination information and services.   Encounter Outcome:  No Answer  Burman Nieves, CCMA Care Coordination Care Guide Direct Dial: [phone]

## 2023-09-12 NOTE — Progress Notes (Signed)
Care Coordination   Note   09/12/2023 Name: Dlyla Meijer MRN: [id number] DOB: 1961-10-31  Shakelia Ludy is a 62 y.o. year old female who sees Osei-Bonsu, Greggory Stallion, MD for primary care. I reached out to [employer] by phone today to offer care coordination services.  Ms. Davoli was given information about Care Coordination services today including:   The Care Coordination services include support from the care team which includes your Nurse Coordinator, Clinical Social Worker, or Pharmacist.  The Care Coordination team is here to help remove barriers to the health concerns and goals most important to you. Care Coordination services are voluntary, and the patient may decline or stop services at any time by request to their care team member.   Care Coordination Consent Status: Patient agreed to services and verbal consent obtained.   Follow up plan:  Telephone appointment with care coordination team member scheduled for:  09/17/2023  Encounter Outcome:  Patient Scheduled  Burman Nieves, [facility] Care Coordination Care Guide Direct Dial: [phone]

## 2023-09-17 ENCOUNTER — Telehealth: Payer: Self-pay | Admitting: Licensed Clinical Social Worker

## 2023-09-17 ENCOUNTER — Encounter: Payer: Self-pay | Admitting: Licensed Clinical Social Worker

## 2023-09-17 NOTE — Patient Outreach (Signed)
Care Coordination   09/17/2023 Name: Shannon Rosario MRN: [id number] DOB: [birth year]   Care Coordination Outreach Attempts:  An unsuccessful telephone outreach was attempted for a scheduled appointment today.  Follow Up Plan:  Additional outreach attempts will be made to offer the patient care coordination information and services.   Encounter Outcome:  No Answer   Care Coordination Interventions:  No, not indicated    Jenel Lucks, MSW, LCSW [facility] Care Management [place]  [facility] [email or web address] Phone [phone] 10:19 AM

## 2023-09-18 ENCOUNTER — Telehealth: Payer: Self-pay | Admitting: Licensed Clinical Social Worker

## 2023-09-18 NOTE — Patient Instructions (Signed)
Visit Information  Thank you for taking time to visit with me today. Please don't hesitate to contact me if I can be of assistance to you.   Following are the goals we discussed today:   Goals Addressed   None     Our next appointment is by telephone on 09/26 at 10:30 AM  Please call the care guide team at [phone] if you need to cancel or reschedule your appointment.   If you are experiencing a Mental Health or Behavioral Health Crisis or need someone to talk to, please call the Suicide and Crisis Lifeline: 988 call 911   Patient verbalizes understanding of instructions and care plan provided today and agrees to view in MyChart. Active MyChart status and patient understanding of how to access instructions and care plan via MyChart confirmed with patient.     Jenel Lucks, MSW, LCSW [facility] Care Management [place]  [facility] [email or web address] Phone [phone] 4:20 PM

## 2023-09-18 NOTE — Patient Outreach (Signed)
Care Coordination   Initial Visit Note   09/18/2023 Name: Shannon Rosario MRN: [id number] DOB: 09-05-1961  Shannon Rosario is a 62 y.o. year old female who sees Osei-Bonsu, Greggory Stallion, MD for primary care. I spoke with  Shannon Rosario by phone today.  What matters to the patients health and wellness today?  Patient was willing to re-schedule initial appt for 09/19/23     SDOH assessments and interventions completed:  No     Care Coordination Interventions:  Yes, provided   Follow up plan:  Initial appt with LCSW re-scheduled for 09/19/23    Encounter Outcome:  Patient Visit Completed   Shannon Rosario, MSW, LCSW [facility] Care Management [facility]  [facility] [email or web address] Phone [phone] 4:20 PM

## 2023-09-19 ENCOUNTER — Ambulatory Visit: Payer: Self-pay | Admitting: Licensed Clinical Social Worker

## 2023-09-20 NOTE — Patient Instructions (Signed)
Visit Information  Thank you for taking time to visit with me today. Please don't hesitate to contact me if I can be of assistance to you.   Following are the goals we discussed today:   Goals Addressed             This Visit's Progress    Obtain Supportive Resources-Grief Support   On track    Activities and task to complete in order to accomplish goals.   Keep all upcoming appointments discussed today Continue with compliance of taking medication prescribed by Doctor Implement healthy coping skills discussed to assist with management of symptoms         Our next appointment is by telephone on 10/24 at 10:30  Please call the care guide team at [phone] if you need to cancel or reschedule your appointment.   If you are experiencing a Mental Health or Behavioral Health Crisis or need someone to talk to, please call the Suicide and Crisis Lifeline: 988 call 911   Patient verbalizes understanding of instructions and care plan provided today and agrees to view in MyChart. Active MyChart status and patient understanding of how to access instructions and care plan via MyChart confirmed with patient.     Jenel Lucks, MSW, LCSW [facility] Care Management [place]  [email or web address] [email or web address] Phone [phone] 7:36 AM

## 2023-09-20 NOTE — Patient Outreach (Signed)
Care Coordination   Initial Visit Note   09/19/2023 Name: Shannon Rosario MRN: [id number] DOB: 04/19/1961  Shannon Rosario is a 62 y.o. year old female who sees Osei-Bonsu, Greggory Stallion, MD for primary care. I spoke with  [name] by phone today.  What matters to the patients health and wellness today?  Supportive Resources    Goals Addressed             This Visit's Progress    Obtain Supportive Resources-Grief Support   On track    Activities and task to complete in order to accomplish goals.   Keep all upcoming appointments discussed today Continue with compliance of taking medication prescribed by Doctor Implement healthy coping skills discussed to assist with management of symptoms         SDOH assessments and interventions completed:  Yes  SDOH Interventions Today    Flowsheet Row Most Recent Value  SDOH Interventions   Food Insecurity Interventions Intervention Not Indicated  Housing Interventions Intervention Not Indicated  Transportation Interventions Intervention Not Indicated        Care Coordination Interventions:  Yes, provided  Interventions Today    Flowsheet Row Most Recent Value  Chronic Disease   Chronic disease during today's visit Diabetes, Hypertension (HTN), Other  [Grief]  General Interventions   General Interventions Discussed/Reviewed General Interventions Discussed, Doctor Visits, [facility]  [Pt reports difficulty managing multiple psychosocial stressors]  Doctor Visits Discussed/Reviewed Doctor Visits Reviewed, Specialist  [Patient has an appt with Cardiology in November 2024 noting chest pain has resolved]  Mental Health Interventions   Mental Health Discussed/Reviewed Mental Health Discussed, Coping Strategies, Anxiety, Depression, Grief and Loss  Nutrition Interventions   Nutrition Discussed/Reviewed Nutrition Discussed  [Pt applied for FS last week, awaiting decision]  Pharmacy Interventions   Pharmacy  Dicussed/Reviewed Pharmacy Topics Discussed, Medication Adherence  Safety Interventions   Safety Discussed/Reviewed Safety Discussed       Follow up plan: Follow up call scheduled for 4 weeks    Encounter Outcome:  Patient Visit Completed   Jenel Lucks, MSW, LCSW [facility] Care Management [facility] Health  [facility] [email or web address] Phone [phone] 7:35 AM

## 2023-09-24 ENCOUNTER — Other Ambulatory Visit: Payer: Self-pay | Admitting: Cardiology

## 2023-09-24 DIAGNOSIS — I10 Essential (primary) hypertension: Secondary | ICD-10-CM

## 2023-10-04 ENCOUNTER — Ambulatory Visit: Payer: Self-pay | Admitting: Cardiology

## 2023-10-17 ENCOUNTER — Ambulatory Visit: Payer: Self-pay | Admitting: Licensed Clinical Social Worker

## 2023-10-17 NOTE — Patient Outreach (Signed)
Care Coordination   Follow Up Visit Note   10/17/2023 Name: Shannon Rosario MRN: [id number] DOB: 03-29-1961  Shannon Rosario is a 62 y.o. year old female who sees Osei-Bonsu, Greggory Stallion, MD for primary care. I spoke with  [name] by phone today.  What matters to the patients health and wellness today?  Patient requested LCSW call her back. Appt rescheduled     SDOH assessments and interventions completed:  No     Care Coordination Interventions:  Yes, provided   Follow up plan: Follow up call scheduled for 10/18/23    Encounter Outcome:  Patient Request to Call Back   Jenel Lucks, MSW, LCSW [facility] Care Management [facility]  [facility] [email or web address] Phone [phone] 1:23 PM

## 2023-10-18 ENCOUNTER — Ambulatory Visit: Payer: Self-pay | Admitting: Licensed Clinical Social Worker

## 2023-10-21 NOTE — Patient Instructions (Signed)
Visit Information  Thank you for taking time to visit with me today. Please don't hesitate to contact me if I can be of assistance to you.   Following are the goals we discussed today:   Goals Addressed             This Visit's Progress    Obtain Supportive Resources-Grief Support   On track    Activities and task to complete in order to accomplish goals.   Keep all upcoming appointments discussed today Continue with compliance of taking medication prescribed by Doctor Implement healthy coping skills discussed to assist with management of symptoms         Our next appointment is by telephone on 12/6 at 9:30 AM  Please call the care guide team at [phone] if you need to cancel or reschedule your appointment.   If you are experiencing a Mental Health or Behavioral Health Crisis or need someone to talk to, please call the Suicide and Crisis Lifeline: 988 call 911   Patient verbalizes understanding of instructions and care plan provided today and agrees to view in MyChart. Active MyChart status and patient understanding of how to access instructions and care plan via MyChart confirmed with patient.     Jenel Lucks, MSW, LCSW [facility] Care Management [place]  [facility] [email or web address] Phone [phone] 3:25 PM

## 2023-10-21 NOTE — Patient Outreach (Signed)
Care Coordination   Follow Up Visit Note   10/18/2023 Name: Arlesia Zaccaria MRN: [id number] DOB: 1961/12/05  Tincy Carstensen is a 62 y.o. year old female who sees Osei-Bonsu, Greggory Stallion, MD for primary care. I spoke with  [name] by phone today.  What matters to the patients health and wellness today?  Grief, Symptom Management    Goals Addressed             This Visit's Progress    Obtain Supportive Resources-Grief Support   On track    Activities and task to complete in order to accomplish goals.   Keep all upcoming appointments discussed today Continue with compliance of taking medication prescribed by Doctor Implement healthy coping skills discussed to assist with management of symptoms         SDOH assessments and interventions completed:  No     Care Coordination Interventions:  Yes, provided  Interventions Today    Flowsheet Row Most Recent Value  Chronic Disease   Chronic disease during today's visit Hypertension (HTN), Diabetes  General Interventions   General Interventions Discussed/Reviewed General Interventions Reviewed, Doctor Visits  Doctor Visits Discussed/Reviewed Doctor Visits Reviewed  Mental Health Interventions   Mental Health Discussed/Reviewed Mental Health Reviewed, Coping Strategies, Grief and Loss, Depression, Anxiety  Nutrition Interventions   Nutrition Discussed/Reviewed Nutrition Reviewed  Pharmacy Interventions   Pharmacy Dicussed/Reviewed Pharmacy Topics Reviewed, Medication Adherence       Follow up plan: Follow up call scheduled for 4-6 weeks    Encounter Outcome:  Patient Visit Completed   Jenel Lucks, MSW, LCSW [facility] Care Management [facility] Health  [facility] [email or web address] Phone [phone] 3:25 PM

## 2023-10-24 DIAGNOSIS — R1011 Right upper quadrant pain: Secondary | ICD-10-CM | POA: Diagnosis not present

## 2023-10-24 DIAGNOSIS — R3915 Urgency of urination: Secondary | ICD-10-CM | POA: Diagnosis not present

## 2023-10-24 DIAGNOSIS — R194 Change in bowel habit: Secondary | ICD-10-CM | POA: Diagnosis not present

## 2023-10-24 DIAGNOSIS — R110 Nausea: Secondary | ICD-10-CM | POA: Diagnosis not present

## 2023-10-25 DIAGNOSIS — E119 Type 2 diabetes mellitus without complications: Secondary | ICD-10-CM | POA: Diagnosis not present

## 2023-10-25 DIAGNOSIS — K2970 Gastritis, unspecified, without bleeding: Secondary | ICD-10-CM | POA: Diagnosis not present

## 2023-10-25 DIAGNOSIS — Z7985 Long-term (current) use of injectable non-insulin antidiabetic drugs: Secondary | ICD-10-CM | POA: Diagnosis not present

## 2023-10-25 DIAGNOSIS — Z7984 Long term (current) use of oral hypoglycemic drugs: Secondary | ICD-10-CM | POA: Diagnosis not present

## 2023-10-30 ENCOUNTER — Ambulatory Visit: Payer: 59 | Admitting: Cardiology

## 2023-11-29 ENCOUNTER — Ambulatory Visit: Payer: Self-pay | Admitting: Licensed Clinical Social Worker

## 2023-11-29 NOTE — Patient Instructions (Signed)
Visit Information  Thank you for taking time to visit with me today. Please don't hesitate to contact me if I can be of assistance to you.   Following are the goals we discussed today:   Goals Addressed             This Visit's Progress    Obtain Supportive Resources-Grief Support   On track    Activities and task to complete in order to accomplish goals.   Keep all upcoming appointments discussed today Continue with compliance of taking medication prescribed by Doctor Implement healthy coping skills discussed to assist with management of symptoms         Our next appointment is by telephone on 01/17 at 9:30 AM  Please call the care guide team at [phone] if you need to cancel or reschedule your appointment.   If you are experiencing a Mental Health or Behavioral Health Crisis or need someone to talk to, please call the Suicide and Crisis Lifeline: 988 call 911   Patient verbalizes understanding of instructions and care plan provided today and agrees to view in MyChart. Active MyChart status and patient understanding of how to access instructions and care plan via MyChart confirmed with patient.     Jenel Lucks, MSW, LCSW [facility] Care Management [place]  [facility] [email or web address] Phone [phone] 9:47 AM

## 2023-11-29 NOTE — Patient Outreach (Signed)
  Care Coordination   Follow Up Visit Note   11/29/2023 Name: Shannon Rosario MRN: [id number] DOB: 12/24/1961  Shannon Rosario is a 62 y.o. year old female who sees Osei-Bonsu, Greggory Stallion, MD for primary care. I spoke with  [name] by phone today.  What matters to the patients health and wellness today?  Symptom Management    Goals Addressed             This Visit's Progress    Obtain Supportive Resources-Grief Support   On track    Activities and task to complete in order to accomplish goals.   Keep all upcoming appointments discussed today Continue with compliance of taking medication prescribed by Doctor Implement healthy coping skills discussed to assist with management of symptoms         SDOH assessments and interventions completed:  No     Care Coordination Interventions:  Yes, provided  Interventions Today    Flowsheet Row Most Recent Value  Chronic Disease   Chronic disease during today's visit Hypertension (HTN), Diabetes, Other  [Gastritis]  General Interventions   General Interventions Discussed/Reviewed General Interventions Reviewed, Doctor Visits  Doctor Visits Discussed/Reviewed Doctor Visits Reviewed  [Patient has an upcoming appt with Gastrologist. Continues to take prescribed meds to assist with symptom management]  Mental Health Interventions   Mental Health Discussed/Reviewed Mental Health Reviewed, Coping Strategies  [Solution Focused Strategies/Self-care]  Pharmacy Interventions   Pharmacy Dicussed/Reviewed Pharmacy Topics Reviewed, Medication Adherence       Follow up plan: Follow up call scheduled for 01/17    Encounter Outcome:  Patient Visit Completed   Jenel Lucks, MSW, LCSW [facility] Care Management [facility] Health  [facility] [email or web address] Phone [phone] 9:47 AM

## 2023-12-04 DIAGNOSIS — M7062 Trochanteric bursitis, left hip: Secondary | ICD-10-CM | POA: Diagnosis not present

## 2023-12-09 DIAGNOSIS — E782 Mixed hyperlipidemia: Secondary | ICD-10-CM | POA: Diagnosis not present

## 2023-12-09 DIAGNOSIS — E1142 Type 2 diabetes mellitus with diabetic polyneuropathy: Secondary | ICD-10-CM | POA: Diagnosis not present

## 2023-12-09 DIAGNOSIS — I10 Essential (primary) hypertension: Secondary | ICD-10-CM | POA: Diagnosis not present

## 2023-12-09 DIAGNOSIS — E1129 Type 2 diabetes mellitus with other diabetic kidney complication: Secondary | ICD-10-CM | POA: Diagnosis not present

## 2023-12-09 DIAGNOSIS — R809 Proteinuria, unspecified: Secondary | ICD-10-CM | POA: Diagnosis not present

## 2023-12-10 DIAGNOSIS — B308 Other viral conjunctivitis: Secondary | ICD-10-CM | POA: Diagnosis not present

## 2023-12-19 DIAGNOSIS — M7062 Trochanteric bursitis, left hip: Secondary | ICD-10-CM | POA: Diagnosis not present

## 2023-12-21 ENCOUNTER — Other Ambulatory Visit: Payer: Self-pay | Admitting: Cardiology

## 2023-12-21 DIAGNOSIS — I10 Essential (primary) hypertension: Secondary | ICD-10-CM

## 2024-01-06 DIAGNOSIS — B351 Tinea unguium: Secondary | ICD-10-CM | POA: Diagnosis not present

## 2024-01-06 DIAGNOSIS — M79675 Pain in left toe(s): Secondary | ICD-10-CM | POA: Diagnosis not present

## 2024-01-06 DIAGNOSIS — M2041 Other hammer toe(s) (acquired), right foot: Secondary | ICD-10-CM | POA: Diagnosis not present

## 2024-01-06 DIAGNOSIS — L600 Ingrowing nail: Secondary | ICD-10-CM | POA: Diagnosis not present

## 2024-01-06 DIAGNOSIS — M79674 Pain in right toe(s): Secondary | ICD-10-CM | POA: Diagnosis not present

## 2024-01-10 ENCOUNTER — Encounter: Payer: Self-pay | Admitting: Licensed Clinical Social Worker

## 2024-01-13 ENCOUNTER — Telehealth: Payer: Self-pay | Admitting: Licensed Clinical Social Worker

## 2024-01-13 NOTE — Patient Outreach (Signed)
  Care Coordination   01/10/2024 Name: Shannon Rosario MRN: [id number] DOB: July 02, 1961   Care Coordination Outreach Attempts:  An unsuccessful outreach was attempted for an appointment today.  Follow Up Plan:  Additional outreach attempts will be made to offer the patient complex care management information and services.   Encounter Outcome:  No Answer   Care Coordination Interventions:  No, not indicated    Jenel Lucks, LCSW [place]  [facility] Clinical Social Worker Direct Dial: [phone]  Fax: [phone] Website: [email or web address] 5:55 AM

## 2024-01-14 ENCOUNTER — Other Ambulatory Visit: Payer: Self-pay | Admitting: Cardiology

## 2024-01-14 DIAGNOSIS — I10 Essential (primary) hypertension: Secondary | ICD-10-CM

## 2024-01-15 ENCOUNTER — Ambulatory Visit: Payer: 59 | Admitting: Cardiology

## 2024-01-15 NOTE — Telephone Encounter (Signed)
Please confirm with the patient if she is taking hydrochlorothiazide 25 mg p.o. daily.  If she is not taking the medication there is no purpose refilling it.  Omeka Holben [place], DO, [facility]

## 2024-01-16 NOTE — Telephone Encounter (Signed)
Spoke with pt over the phone and verified that she is still taking hydrochlorothiazide 25 mg once daily. Pt stated that is correct. Refill for medication has been sent to pharmacy and pt is aware.

## 2024-01-20 ENCOUNTER — Other Ambulatory Visit: Payer: Self-pay

## 2024-01-20 ENCOUNTER — Emergency Department (HOSPITAL_BASED_OUTPATIENT_CLINIC_OR_DEPARTMENT_OTHER)
Admission: EM | Admit: 2024-01-20 | Discharge: 2024-01-20 | Disposition: A | Discharge status: Home / Self Care | Payer: 59 | Attending: Emergency Medicine | Admitting: Emergency Medicine

## 2024-01-20 ENCOUNTER — Emergency Department (HOSPITAL_BASED_OUTPATIENT_CLINIC_OR_DEPARTMENT_OTHER): Payer: 59

## 2024-01-20 ENCOUNTER — Encounter (HOSPITAL_BASED_OUTPATIENT_CLINIC_OR_DEPARTMENT_OTHER): Payer: Self-pay | Admitting: Emergency Medicine

## 2024-01-20 DIAGNOSIS — I10 Essential (primary) hypertension: Secondary | ICD-10-CM | POA: Diagnosis not present

## 2024-01-20 DIAGNOSIS — Z7982 Long term (current) use of aspirin: Secondary | ICD-10-CM | POA: Insufficient documentation

## 2024-01-20 DIAGNOSIS — R1011 Right upper quadrant pain: Secondary | ICD-10-CM | POA: Diagnosis not present

## 2024-01-20 DIAGNOSIS — R160 Hepatomegaly, not elsewhere classified: Secondary | ICD-10-CM | POA: Diagnosis not present

## 2024-01-20 DIAGNOSIS — E119 Type 2 diabetes mellitus without complications: Secondary | ICD-10-CM | POA: Diagnosis not present

## 2024-01-20 DIAGNOSIS — K859 Acute pancreatitis without necrosis or infection, unspecified: Secondary | ICD-10-CM | POA: Diagnosis not present

## 2024-01-20 DIAGNOSIS — R1013 Epigastric pain: Secondary | ICD-10-CM | POA: Diagnosis present

## 2024-01-20 DIAGNOSIS — Z8613 Personal history of malaria: Secondary | ICD-10-CM | POA: Diagnosis not present

## 2024-01-20 DIAGNOSIS — K5730 Diverticulosis of large intestine without perforation or abscess without bleeding: Secondary | ICD-10-CM | POA: Diagnosis not present

## 2024-01-20 DIAGNOSIS — K429 Umbilical hernia without obstruction or gangrene: Secondary | ICD-10-CM | POA: Diagnosis not present

## 2024-01-20 DIAGNOSIS — R531 Weakness: Secondary | ICD-10-CM | POA: Diagnosis not present

## 2024-01-20 LAB — URINALYSIS, ROUTINE W REFLEX MICROSCOPIC
Bacteria, UA: NONE SEEN
Bilirubin Urine: NEGATIVE
Glucose, UA: NEGATIVE mg/dL
Hgb urine dipstick: NEGATIVE
Ketones, ur: NEGATIVE mg/dL
Nitrite: NEGATIVE
Protein, ur: NEGATIVE mg/dL
Specific Gravity, Urine: 1.028 (ref 1.005–1.030)
pH: 5.5 (ref 5.0–8.0)

## 2024-01-20 LAB — CBC WITH DIFFERENTIAL/PLATELET
Abs Immature Granulocytes: 0.04 10*3/uL (ref 0.00–0.07)
Basophils Absolute: 0 10*3/uL (ref 0.0–0.1)
Basophils Relative: 0 %
Eosinophils Absolute: 0.2 10*3/uL (ref 0.0–0.5)
Eosinophils Relative: 2 %
HCT: 43.7 % (ref 36.0–46.0)
Hemoglobin: 14.3 g/dL (ref 12.0–15.0)
Immature Granulocytes: 0 %
Lymphocytes Relative: 45 %
Lymphs Abs: 4.9 10*3/uL — ABNORMAL HIGH (ref 0.7–4.0)
MCH: 24.8 pg — ABNORMAL LOW (ref 26.0–34.0)
MCHC: 32.7 g/dL (ref 30.0–36.0)
MCV: 75.7 fL — ABNORMAL LOW (ref 80.0–100.0)
Monocytes Absolute: 0.5 10*3/uL (ref 0.1–1.0)
Monocytes Relative: 4 %
Neutro Abs: 5.4 10*3/uL (ref 1.7–7.7)
Neutrophils Relative %: 49 %
Platelets: 401 10*3/uL — ABNORMAL HIGH (ref 150–400)
RBC: 5.77 MIL/uL — ABNORMAL HIGH (ref 3.87–5.11)
RDW: 16 % — ABNORMAL HIGH (ref 11.5–15.5)
WBC: 11.1 10*3/uL — ABNORMAL HIGH (ref 4.0–10.5)
nRBC: 0 % (ref 0.0–0.2)

## 2024-01-20 LAB — COMPREHENSIVE METABOLIC PANEL
ALT: 21 U/L (ref 0–44)
AST: 33 U/L (ref 15–41)
Albumin: 4.6 g/dL (ref 3.5–5.0)
Alkaline Phosphatase: 51 U/L (ref 38–126)
Anion gap: 13 (ref 5–15)
BUN: 20 mg/dL (ref 8–23)
CO2: 25 mmol/L (ref 22–32)
Calcium: 9.7 mg/dL (ref 8.9–10.3)
Chloride: 100 mmol/L (ref 98–111)
Creatinine, Ser: 1.05 mg/dL — ABNORMAL HIGH (ref 0.44–1.00)
GFR, Estimated: 60 mL/min — ABNORMAL LOW (ref >60)
Glucose, Bld: 72 mg/dL (ref 70–99)
Potassium: 4.5 mmol/L (ref 3.5–5.1)
Sodium: 138 mmol/L (ref 135–145)
Total Bilirubin: 0.6 mg/dL (ref 0.0–1.2)
Total Protein: 7.6 g/dL (ref 6.5–8.1)

## 2024-01-20 LAB — TROPONIN I (HIGH SENSITIVITY): Troponin I (High Sensitivity): 3 ng/L (ref <18)

## 2024-01-20 LAB — LIPASE, BLOOD: Lipase: 105 U/L — ABNORMAL HIGH (ref 11–51)

## 2024-01-20 MED ORDER — MORPHINE SULFATE (PF) 4 MG/ML IV SOLN
4.0000 mg | Freq: Once | INTRAVENOUS | Status: DC
  Filled 2024-01-20: qty 1

## 2024-01-20 MED ORDER — ONDANSETRON HCL 4 MG/2ML IJ SOLN
4.0000 mg | Freq: Once | INTRAMUSCULAR | Status: AC
  Administered 2024-01-20: 4 mg via INTRAVENOUS
  Filled 2024-01-20: qty 2

## 2024-01-20 MED ORDER — ONDANSETRON HCL 4 MG/2ML IJ SOLN
4.0000 mg | Freq: Once | INTRAMUSCULAR | Status: DC
  Filled 2024-01-20: qty 2

## 2024-01-20 MED ORDER — SODIUM CHLORIDE 0.9 % IV BOLUS
1000.0000 mL | Freq: Once | INTRAVENOUS | Status: AC
  Administered 2024-01-20: 1000 mL via INTRAVENOUS

## 2024-01-20 MED ORDER — MORPHINE SULFATE (PF) 2 MG/ML IV SOLN
2.0000 mg | Freq: Once | INTRAVENOUS | Status: AC
  Administered 2024-01-20: 2 mg via INTRAVENOUS
  Filled 2024-01-20: qty 1

## 2024-01-20 MED ORDER — IOHEXOL 300 MG/ML  SOLN
100.0000 mL | Freq: Once | INTRAMUSCULAR | Status: AC | PRN
  Administered 2024-01-20: 100 mL via INTRAVENOUS

## 2024-01-20 MED ORDER — ONDANSETRON HCL 4 MG PO TABS: 4.0000 mg | ORAL_TABLET | Freq: Four times a day (QID) | ORAL | 0 refills | Status: AC

## 2024-01-20 NOTE — ED Provider Triage Note (Signed)
Emergency Medicine Provider Triage Evaluation Note  Shannon Rosario , a 63 y.o. female  was evaluated in triage.  Pt complains of RUQ abd pain.  Review of Systems  Positive: RUQ pain, nausea Negative: Cough, SOB  Physical Exam  BP 111/71 (BP Location: Right Arm)   Pulse (!) 121   Temp 97.9 F (36.6 C)   Resp 14   Ht 5\' 8"  (1.727 m)   Wt 108.9 kg   SpO2 97%   BMI 36.49 kg/m  Gen:   Awake, no distress   Resp:  Normal effort  MSK:   Moves extremities without difficulty  Other:    Medical Decision Making  Medically screening exam initiated at 3:59 PM.  Appropriate orders placed.  Shannon Rosario was informed that the remainder of the evaluation will be completed by another provider, this initial triage assessment does not replace that evaluation, and the importance of remaining in the ED until their evaluation is complete.  S/p cholecystectomy (>20 year) RUQ pain for 2 days, nausea, loss of appetite, no chest symptoms.  Tachycardic   Elpidio Anis, PA-C 01/20/24 1600

## 2024-01-20 NOTE — ED Provider Notes (Signed)
[place] EMERGENCY DEPARTMENT AT [facility] Provider Note  CSN: [id number] Arrival date & time: 01/20/24 1321  Chief Complaint(s) Abdominal Pain  HPI Shannon Rosario is a 63 y.o. female here today for 2 weeks of epigastric and right upper quadrant pain, weakness, fatigue.  Patient says that she has not had an appetite.  Prior history of cholecystectomy.  No vomiting, no diarrhea, no fever, no chills.   Past Medical History Past Medical History:  Diagnosis Date   Anxiety    Arthritis    Depression    Diabetes mellitus without complication (HCC)    Family history of adverse reaction to anesthesia    My Mother had some complications,I dont remember what    Fibromyalgia    GERD (gastroesophageal reflux disease)    Heart murmur    Hypercholesterolemia    Hypertension    Obesity    OSA (obstructive sleep apnea)    TIA (transient ischemic attack) 12/24/2010   Patient Active Problem List   Diagnosis Date Noted   Angina pectoris, unstable (HCC) 08/11/2021   Non-restorative sleep 08/11/2021   Unstable angina (HCC) 03/16/2021   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 Authorizing Provider  ondansetron (ZOFRAN) 4 MG tablet Take 1 tablet (4 mg total) by mouth every 6 (six) hours. 01/20/24  Yes Arletha Pili, DO  aspirin EC 81 MG tablet Take 1 tablet (81 mg total) by mouth daily. Swallow whole. 10/05/21   Tolia, Sunit, DO  celecoxib (CELEBREX) 100 MG capsule Take 100 mg by mouth  2 (two) times daily. 03/26/22   [provider]  cholecalciferol (VITAMIN D) 1000 UNITS tablet Take 1,000 Units by mouth daily.    [provider]  cycloSPORINE (RESTASIS) 0.05 % ophthalmic emulsion 2 (two) times daily.    [provider]  EPINEPHrine 0.3 mg/0.3 mL IJ SOAJ injection Inject 0.3 mLs (0.3 mg total) into the muscle once as needed (anaphylaxis). 09/18/16   Horton, Mayer Masker, MD  glimepiride (AMARYL) 4 MG tablet Take 4 mg by mouth in the morning and at bedtime.    [provider]  hydrochlorothiazide (HYDRODIURIL) 25 MG tablet TAKE 1 TABLET (25 MG TOTAL) BY MOUTH IN THE MORNING 01/16/24   Tolia, Sunit, DO  levocetirizine (XYZAL) 5 MG tablet Take 5 mg by mouth daily.    [provider]  losartan (COZAAR) 50 MG tablet Take 1 tablet (50 mg total) by mouth every evening. 09/05/23   Rosezella Rumpf, PA-C  loteprednol (LOTEMAX) 0.5 % ophthalmic suspension SMARTSIG:In Eye(s) 05/28/22   [provider]  magnesium oxide (MAG-OX) 400 (240 Mg) MG tablet TAKE 1 TABLET BY MOUTH TWICE A DAY 03/04/23   Tolia, Sunit, DO  metFORMIN (GLUCOPHAGE) 500 MG tablet Take 2 tablets by mouth 2 (two) times daily. 12/06/20   [provider]  metoprolol succinate (TOPROL XL) 25 MG 24 hr tablet Take 1 tablet (25 mg total) by mouth daily. 10/05/21 08/16/22  Tolia, Sunit, DO  nitroGLYCERIN (NITROSTAT) 0.4 MG SL tablet Place 1 tablet (  0.4 mg total) under the tongue every 5 (five) minutes as needed for chest pain. 09/05/23   Rosezella Rumpf, PA-C  omeprazole (PRILOSEC) 20 MG capsule Take 20 mg by mouth daily.    [provider]  rosuvastatin (CRESTOR) 10 MG tablet Take 10 mg by mouth daily. 12/06/20   [provider]  vitamin E 1000 UNIT capsule Take 1,000 Units by mouth daily.    [provider]                                                                                                                                    Past Surgical  History Past Surgical History:  Procedure Laterality Date   APPENDECTOMY     CARPAL TUNNEL RELEASE  1987   CESAREAN SECTION  1988   CHOLECYSTECTOMY  1984   KNEE SURGERY Left 1995   LEFT HEART CATH AND CORONARY ANGIOGRAPHY N/A 03/16/2021   Procedure: LEFT HEART CATH AND CORONARY ANGIOGRAPHY;  Surgeon: Elder Negus, MD;  Location: [facility] INVASIVE CV LAB;  Service: Cardiovascular;  Laterality: N/A;   NASAL SINUS SURGERY  1992   TONSILLECTOMY  1968   Family History Family History  Problem Relation Age of Onset   Other Mother    CAD Mother    Asthma Mother    Allergic rhinitis Mother    Lupus Mother    Dementia Mother    Hypertension Mother    Diabetes Mother    Stroke Mother    Allergic rhinitis Father    Dementia Father    Eczema Sister    Bronchitis Sister    Sleep apnea Sister    Allergic rhinitis Daughter    Food Allergy Daughter    Bronchitis Daughter    Allergic rhinitis Grandchild    Eczema Grandchild    Urticaria Grandchild    Bronchitis Grandchild    Allergic rhinitis Grandchild    Bronchitis Grandchild    Sleep apnea Cousin        "most of my cousins on my father's side"   Angioedema Neg Hx    Immunodeficiency Neg Hx    Atopy Neg Hx     Social History Social History   Tobacco Use   Smoking status: Never    Passive exposure: Yes   Smokeless tobacco: Never  Vaping Use   Vaping status: Never Used  Substance Use Topics   Alcohol use: Not Currently    Comment: "very little"   Drug use: Never   Allergies Bee venom, Pineapple, and Dobutamine  Review of Systems Review of Systems  Physical Exam Vital Signs  I have reviewed the triage vital signs BP 127/76   Pulse 77   Temp 97.9 F (36.6 C) (Oral)   Resp 17   Ht 5\' 8"  (1.727 m)   Wt 108.9 kg   SpO2 94%   BMI 36.49 kg/m   Physical Exam Vitals reviewed.  Cardiovascular:  Rate and Rhythm: Tachycardia present.  Abdominal:     General: Abdomen is flat.     Palpations: Abdomen is  soft.     Tenderness: There is abdominal tenderness in the epigastric area.     Hernia: No hernia is present.  Neurological:     Mental Status: She is alert.     ED Results and Treatments Labs (all labs ordered are listed, but only abnormal results are displayed) Labs Reviewed  CBC WITH DIFFERENTIAL/PLATELET - Abnormal; Notable for the following components:      Result Value   WBC 11.1 (*)    RBC 5.77 (*)    MCV 75.7 (*)    MCH 24.8 (*)    RDW 16.0 (*)    Platelets 401 (*)    Lymphs Abs 4.9 (*)    All other components within normal limits  COMPREHENSIVE METABOLIC PANEL - Abnormal; Notable for the following components:   Creatinine, Ser 1.05 (*)    GFR, Estimated 60 (*)    All other components within normal limits  LIPASE, BLOOD - Abnormal; Notable for the following components:   Lipase 105 (*)    All other components within normal limits  URINALYSIS, ROUTINE W REFLEX MICROSCOPIC - Abnormal; Notable for the following components:   Leukocytes,Ua SMALL (*)    All other components within normal limits  TROPONIN I (HIGH SENSITIVITY)                                                                                                                          Radiology CT ABDOMEN PELVIS W CONTRAST Result Date: 01/20/2024 CLINICAL DATA:  Pancreatitis, acute, severe Right upper quadrant pain. History of cholecystectomy and appendectomy. EXAM: CT ABDOMEN AND PELVIS WITH CONTRAST TECHNIQUE: Multidetector CT imaging of the abdomen and pelvis was performed using the standard protocol following bolus administration of intravenous contrast. RADIATION DOSE REDUCTION: This exam was performed according to the departmental dose-optimization program which includes automated exposure control, adjustment of the mA and/or kV according to patient size and/or use of iterative reconstruction technique. CONTRAST:  OMNIPAQUE IOHEXOL 300 MG/ML  SOLN COMPARISON:  None Available. FINDINGS: Lower chest: Minor  subsegmental atelectasis in the lower lobes. No pleural effusion Hepatobiliary: The liver is enlarged spanning 21.1 cm in cranial caudal dimension. Diffuse hepatic steatosis. No focal liver lesion. Clips in the gallbladder fossa postcholecystectomy. No biliary dilatation. Pancreas: Mild peripancreatic fat stranding about the distal body and tail. Homogeneous pancreatic enhancement without necrosis. No acute peripancreatic collection. No ductal dilatation. No evidence of pancreatic mass. Spleen: Normal in size without focal abnormality. Adrenals/Urinary Tract: No adrenal nodule. No hydronephrosis or perinephric edema. Homogeneous renal enhancement with symmetric excretion on delayed phase imaging. No renal calculi or suspicious renal abnormality. Urinary bladder is near completely empty. Stomach/Bowel: No abnormal gastric distension. No duodenal or small bowel wall thickening. No obstruction. The appendix is not visualized consistent with history of appendectomy. Small volume of formed stool in the colon. Left  colonic diverticulosis. No diverticulitis. Vascular/Lymphatic: Mild aorto bi-iliac atherosclerosis. No aneurysm. The portal vein is patent. The splenic and mesenteric veins are patent. No bulky abdominopelvic adenopathy. Reproductive: Atrophic uterus is normal for age.  No adnexal mass. Other: No ascites. No free air or focal fluid collection. Diminutive fat containing umbilical hernia. Musculoskeletal: Degenerative change in the lumbar spine and both hips. There are no acute or suspicious osseous abnormalities. IMPRESSION: 1. Acute pancreatitis, mild peripancreatic fat stranding by CT. No necrosis or acute pancreatic collection. 2. Hepatomegaly with hepatic steatosis. 3. Left colonic diverticulosis without diverticulitis. Aortic Atherosclerosis (ICD10-I70.0). Electronically Signed   By: Narda Rutherford M.D.   On: 01/20/2024 18:30    Pertinent labs & imaging results that were available during my care of the  patient were reviewed by me and considered in my medical decision making (see MDM for details).  Medications Ordered in ED Medications  morphine (PF) 4 MG/ML injection 4 mg (0 mg Intravenous Hold 01/20/24 1822)  ondansetron (ZOFRAN) injection 4 mg (0 mg Intravenous Hold 01/20/24 1823)  ondansetron (ZOFRAN) injection 4 mg (4 mg Intravenous Given 01/20/24 1615)  sodium chloride 0.9 % bolus 1,000 mL (0 mLs Intravenous Stopped 01/20/24 1921)  iohexol (OMNIPAQUE) 300 MG/ML solution 100 mL (100 mLs Intravenous Contrast Given 01/20/24 1755)                                                                                                                                     Procedures Procedures  (including critical care time)  Medical Decision Making / ED Course   This patient presents to the ED for concern of abdominal pain and fatigue, this involves an extensive number of treatment options, and is a complaint that carries with it a high risk of complications and morbidity.  The differential diagnosis includes pancreatitis, malignancy, peptic ulcer disease, less likely SBO, less likely acute intra-abdominal infection.  MDM: Will obtain imaging of the patient's abdomen pelvis.  Labs done at triage show normal LFTs, elevated lipase.  Patient does not drink alcohol. Provide IV fluids for tachycardia.  Believe this is due to dehydration  Reassessment 8:30 PM-patient's CT imaging shows pancreatitis.  Consistent with the patient's lipase and previous history.  Patient's history of mildly elevated triglycerides, but nothing high enough that I would expect triglyceride induced pancreatitis.  Do not currently have that lab available in our department.  Patient able to tolerate p.o.  Discussed options with her which included admission versus at home with GI follow-up.  Patient Preferred GI follow-up.  Will discharge.   Additional history obtained: -Additional history obtained from husband at bedside -External  records from outside source obtained and reviewed including: Chart review including previous notes, labs, imaging, consultation notes   Lab Tests: -I ordered, reviewed, and interpreted labs.   The pertinent results include:   Labs Reviewed  CBC WITH DIFFERENTIAL/PLATELET - Abnormal; Notable for the following components:  Result Value   WBC 11.1 (*)    RBC 5.77 (*)    MCV 75.7 (*)    MCH 24.8 (*)    RDW 16.0 (*)    Platelets 401 (*)    Lymphs Abs 4.9 (*)    All other components within normal limits  COMPREHENSIVE METABOLIC PANEL - Abnormal; Notable for the following components:   Creatinine, Ser 1.05 (*)    GFR, Estimated 60 (*)    All other components within normal limits  LIPASE, BLOOD - Abnormal; Notable for the following components:   Lipase 105 (*)    All other components within normal limits  URINALYSIS, ROUTINE W REFLEX MICROSCOPIC - Abnormal; Notable for the following components:   Leukocytes,Ua SMALL (*)    All other components within normal limits  TROPONIN I (HIGH SENSITIVITY)      EKG7 depressions or elevations, no evidence of acute ischemia  EKG Interpretation Date/Time:  Monday January 20 2024 18:13:33 EST Ventricular Rate:  86 PR Interval:  158 QRS Duration:  77 QT Interval:  367 QTC Calculation: 439 R Axis:   -17  Text Interpretation: Sinus rhythm Inferior infarct, old Confirmed by Anders Simmonds [phone]) on 01/20/2024 8:35:44 PM         Imaging Studies ordered: I ordered imaging studies including CT imaging abdomen pelvis I independently visualized and interpreted imaging. I agree with the radiologist interpretation   Medicines ordered and prescription drug management: Meds ordered this encounter  Medications   ondansetron (ZOFRAN) injection 4 mg   morphine (PF) 4 MG/ML injection 4 mg   ondansetron (ZOFRAN) injection 4 mg   sodium chloride 0.9 % bolus 1,000 mL   iohexol (OMNIPAQUE) 300 MG/ML solution 100 mL   ondansetron (ZOFRAN) 4 MG  tablet    Sig: Take 1 tablet (4 mg total) by mouth every 6 (six) hours.    Dispense:  12 tablet    Refill:  0    -I have reviewed the patients home medicines and have made adjustments as needed   Cardiac Monitoring: The patient was maintained on a cardiac monitor.  I personally viewed and interpreted the cardiac monitored which showed an underlying rhythm of: Normal sinus rhythm  Social Determinants of Health:  Factors impacting patients care include:    Reevaluation: After the interventions noted above, I reevaluated the patient and found that they have :improved  Co morbidities that complicate the patient evaluation  Past Medical History:  Diagnosis Date   Anxiety    Arthritis    Depression    Diabetes mellitus without complication (HCC)    Family history of adverse reaction to anesthesia    My Mother had some complications,I dont remember what    Fibromyalgia    GERD (gastroesophageal reflux disease)    Heart murmur    Hypercholesterolemia    Hypertension    Obesity    OSA (obstructive sleep apnea)    TIA (transient ischemic attack) 12/24/2010      Dispostion: I considered admission for this patient, however patient preferred to go home and is appropriate for outpatient follow-up.     Final Clinical Impression(s) / ED Diagnoses Final diagnoses:  Acute pancreatitis without infection or necrosis, unspecified pancreatitis type     @PCDICTATION @    Anders Simmonds T, DO 01/20/24 2037

## 2024-01-20 NOTE — ED Triage Notes (Signed)
C/o RUQ ABD pain x 2 weeks. Progressively gotten worse. N/v. Denies fever. Seen at PCP today for same.

## 2024-01-20 NOTE — Discharge Instructions (Addendum)
While you are in the emergency room you are diagnosed with pancreatitis.  You can take Zofran as needed for nausea.  You can take Motrin and Tylenol for pain.  Pancreatitis will often be made worse by heavy meals.  I would recommend a light diet of liquids, soups and broths.  Including your discharge paperwork is a telephone number for a GI doctor.  You should call them to make a follow-up appointment.  Would also recommend following up with your primary care doctor within 1 week.  Return to the emergency department if you develop worsening abdominal pain, inability to eat or drink.

## 2024-01-23 DIAGNOSIS — H9202 Otalgia, left ear: Secondary | ICD-10-CM | POA: Diagnosis not present

## 2024-01-23 DIAGNOSIS — R160 Hepatomegaly, not elsewhere classified: Secondary | ICD-10-CM | POA: Diagnosis not present

## 2024-01-23 DIAGNOSIS — I700 Atherosclerosis of aorta: Secondary | ICD-10-CM | POA: Diagnosis not present

## 2024-01-23 DIAGNOSIS — K760 Fatty (change of) liver, not elsewhere classified: Secondary | ICD-10-CM | POA: Diagnosis not present

## 2024-01-23 DIAGNOSIS — J0110 Acute frontal sinusitis, unspecified: Secondary | ICD-10-CM | POA: Diagnosis not present

## 2024-01-23 DIAGNOSIS — K8500 Idiopathic acute pancreatitis without necrosis or infection: Secondary | ICD-10-CM | POA: Diagnosis not present

## 2024-01-29 ENCOUNTER — Ambulatory Visit: Payer: 59 | Admitting: Cardiology

## 2024-01-31 ENCOUNTER — Telehealth: Payer: Self-pay | Admitting: Cardiology

## 2024-01-31 NOTE — Telephone Encounter (Signed)
 Received a referral requesting a provider switch from Dr. Albert Huff to Dr. Berry Bristol.   Are you both okay with this switch?

## 2024-02-01 ENCOUNTER — Encounter: Payer: Self-pay | Admitting: Cardiology

## 2024-02-01 NOTE — Telephone Encounter (Signed)
I am okay

## 2024-02-01 NOTE — Telephone Encounter (Signed)
 Shannon Rosario , I reviewed her chart.  Patient is very stable from cardiac standpoint, she has been well-managed for hypertension and cardiac risk factors, she has no significant coronary disease by cardiac catheterization, as long as blood pressure is well-controlled, lipids are controlled, she can follow-up with her PCP and see us  on a as needed basis. She does not need any cardiac evaluation further.  I will also send her a MyChart message.

## 2024-02-01 NOTE — Telephone Encounter (Signed)
 Sure, I am okay with the switch.   Shannon Weisensel [place], DO, [facility]

## 2024-02-04 NOTE — Telephone Encounter (Signed)
Called patient informing her of provider switch approval and confirmed she received the mychart message from Dr. Jacinto Halim.  Patient verbalized understanding and states she reviewed the message.  Per patient she would like to continue care with Dr. Odis Hollingshead and not go through with provider switch her PCP requested.   Advised patient I would document this in her chart. She will schedule as needed moving forward.

## 2024-02-05 DIAGNOSIS — E782 Mixed hyperlipidemia: Secondary | ICD-10-CM | POA: Diagnosis not present

## 2024-02-05 DIAGNOSIS — Z7984 Long term (current) use of oral hypoglycemic drugs: Secondary | ICD-10-CM | POA: Diagnosis not present

## 2024-02-05 DIAGNOSIS — I10 Essential (primary) hypertension: Secondary | ICD-10-CM | POA: Diagnosis not present

## 2024-02-05 DIAGNOSIS — K859 Acute pancreatitis without necrosis or infection, unspecified: Secondary | ICD-10-CM | POA: Diagnosis not present

## 2024-02-05 DIAGNOSIS — E119 Type 2 diabetes mellitus without complications: Secondary | ICD-10-CM | POA: Diagnosis not present

## 2024-02-05 DIAGNOSIS — Z79899 Other long term (current) drug therapy: Secondary | ICD-10-CM | POA: Diagnosis not present

## 2024-02-12 DIAGNOSIS — E782 Mixed hyperlipidemia: Secondary | ICD-10-CM | POA: Diagnosis not present

## 2024-02-12 DIAGNOSIS — E119 Type 2 diabetes mellitus without complications: Secondary | ICD-10-CM | POA: Diagnosis not present

## 2024-02-12 DIAGNOSIS — I10 Essential (primary) hypertension: Secondary | ICD-10-CM | POA: Diagnosis not present

## 2024-02-12 DIAGNOSIS — K760 Fatty (change of) liver, not elsewhere classified: Secondary | ICD-10-CM | POA: Diagnosis not present

## 2024-02-12 DIAGNOSIS — K858 Other acute pancreatitis without necrosis or infection: Secondary | ICD-10-CM | POA: Diagnosis not present

## 2024-02-17 DIAGNOSIS — H9202 Otalgia, left ear: Secondary | ICD-10-CM | POA: Diagnosis not present

## 2024-02-17 DIAGNOSIS — K760 Fatty (change of) liver, not elsewhere classified: Secondary | ICD-10-CM | POA: Diagnosis not present

## 2024-02-17 DIAGNOSIS — E1129 Type 2 diabetes mellitus with other diabetic kidney complication: Secondary | ICD-10-CM | POA: Diagnosis not present

## 2024-02-17 DIAGNOSIS — J0110 Acute frontal sinusitis, unspecified: Secondary | ICD-10-CM | POA: Diagnosis not present

## 2024-02-17 DIAGNOSIS — K8500 Idiopathic acute pancreatitis without necrosis or infection: Secondary | ICD-10-CM | POA: Diagnosis not present

## 2024-02-17 DIAGNOSIS — I700 Atherosclerosis of aorta: Secondary | ICD-10-CM | POA: Diagnosis not present

## 2024-02-17 DIAGNOSIS — Z0001 Encounter for general adult medical examination with abnormal findings: Secondary | ICD-10-CM | POA: Diagnosis not present

## 2024-02-17 DIAGNOSIS — R160 Hepatomegaly, not elsewhere classified: Secondary | ICD-10-CM | POA: Diagnosis not present

## 2024-02-18 DIAGNOSIS — B308 Other viral conjunctivitis: Secondary | ICD-10-CM | POA: Diagnosis not present

## 2024-02-20 ENCOUNTER — Other Ambulatory Visit: Payer: Self-pay

## 2024-02-20 ENCOUNTER — Ambulatory Visit
Admission: EM | Admit: 2024-02-20 | Discharge: 2024-02-20 | Disposition: A | Discharge status: Home / Self Care | Payer: 59 | Attending: Family Medicine | Admitting: Family Medicine

## 2024-02-20 DIAGNOSIS — B349 Viral infection, unspecified: Secondary | ICD-10-CM | POA: Diagnosis not present

## 2024-02-20 LAB — POCT URINALYSIS DIP (MANUAL ENTRY)
Bilirubin, UA: NEGATIVE
Blood, UA: NEGATIVE
Glucose, UA: NEGATIVE mg/dL
Nitrite, UA: NEGATIVE
Protein Ur, POC: NEGATIVE mg/dL
Spec Grav, UA: 1.025 (ref 1.010–1.025)
Urobilinogen, UA: 0.2 U/dL
pH, UA: 5.5 (ref 5.0–8.0)

## 2024-02-20 LAB — POC COVID19/FLU A&B COMBO
Covid Antigen, POC: NEGATIVE
Influenza A Antigen, POC: NEGATIVE
Influenza B Antigen, POC: NEGATIVE

## 2024-02-20 NOTE — Discharge Instructions (Signed)

## 2024-02-20 NOTE — ED Triage Notes (Addendum)
 Pt presents with complaints of dizziness, (lower) back pain, headaches, and feelings of hot/cold that began yesterday. Pt is also reporting left ear pain x 2 weeks. Pt reports she has been applying prescribed drops to ear with no relief. Pt is currently reporting 8/10 pain. Takes OTC allergy medication daily.

## 2024-02-20 NOTE — ED Provider Notes (Signed)
 [name]    CSN: [id number] Arrival date & time: 02/20/24  1547      History   Chief Complaint No chief complaint on file.   HPI Shannon Rosario is a 63 y.o. female.   The history is provided by the patient.  Left ear pain for 2 weeks, seen for same treated with eardrops without relief. Yesterday developed bodyaches (especially her back), chills, fatigue.  Admits sneezing, slight rhinorrhea nasal congestion, minimal cough.  Denies chest pain, shortness of breath, nausea, vomiting, diarrhea, rashes or skin changes.  Denies household contacts with illness. Had 1 episode of dysuria today.  Past Medical History:  Diagnosis Date   Anxiety    Arthritis    Depression    Diabetes mellitus without complication (HCC)    Family history of adverse reaction to anesthesia    My Mother had some complications,I dont remember what    Fibromyalgia    GERD (gastroesophageal reflux disease)    Heart murmur    Hypercholesterolemia    Hypertension    Obesity    OSA (obstructive sleep apnea)    TIA (transient ischemic attack) 12/24/2010    Patient Active Problem List   Diagnosis Date Noted   Angina pectoris, unstable (HCC) 08/11/2021   Non-restorative sleep 08/11/2021   Unstable angina (HCC) 03/16/2021   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   Fire ant bite 10/22/2016   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/2016   Allergic urticaria 10/22/2016   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 10/22/2016   Obstructive sleep apnea treated with continuous positive airway pressure (CPAP) 10/22/2016   Chest pain 09/21/2015   Essential hypertension 09/21/2015   Diabetes (HCC) 09/21/2015   Pain in the chest     Past Surgical History:  Procedure Laterality Date   APPENDECTOMY     CARPAL TUNNEL RELEASE  1987   CESAREAN SECTION  1988   CHOLECYSTECTOMY  1984   KNEE SURGERY Left 1995   LEFT HEART CATH AND CORONARY ANGIOGRAPHY N/A 03/16/2021    Procedure: LEFT HEART CATH AND CORONARY ANGIOGRAPHY;  Surgeon: Elder Negus, MD;  Location: [facility];  Service: Cardiovascular;  Laterality: N/A;   NASAL SINUS SURGERY  1992   TONSILLECTOMY  1968    OB History   No obstetric history on file.      Home Medications    Prior to Admission medications   Medication Sig Start Date End Date Taking? Authorizing Provider  acetic acid 2 % otic solution SMARTSIG:5 Drop(s) In Ear(s) 3 Times Daily 01/23/24  Yes [provider]  aspirin EC 81 MG tablet Take 1 tablet (81 mg total) by mouth daily. Swallow whole. 10/05/21   Tolia, Sunit, DO  celecoxib (CELEBREX) 100 MG capsule Take 100 mg by mouth 2 (two) times daily. 03/26/22   [provider]  cholecalciferol (VITAMIN D) 1000 UNITS tablet Take 1,000 Units by mouth daily.    [provider]  cycloSPORINE (RESTASIS) 0.05 % ophthalmic emulsion 2 (two) times daily.    [provider]  EPINEPHrine 0.3 mg/0.3 mL IJ SOAJ injection Inject 0.3 mLs (0.3 mg total) into the muscle once as needed (anaphylaxis). 09/18/16   Horton, Mayer Masker, MD  glimepiride (AMARYL) 4 MG tablet Take 4 mg by mouth in the morning and at bedtime.    [provider]  hydrochlorothiazide (HYDRODIURIL) 25 MG tablet TAKE 1 TABLET (25 MG TOTAL) BY MOUTH IN THE MORNING 01/16/24   Tolia, Sunit, DO  levocetirizine (XYZAL) 5 MG tablet Take 5 mg by mouth daily.    [provider]  losartan (COZAAR) 50 MG tablet Take 1 tablet (50 mg total) by mouth every evening. 09/05/23   Rosezella Rumpf, PA-C  loteprednol (LOTEMAX) 0.5 % ophthalmic suspension SMARTSIG:In Eye(s) 05/28/22   [provider]  magnesium oxide (MAG-OX) 400 (240 Mg) MG tablet TAKE 1 TABLET BY MOUTH TWICE A DAY 03/04/23   Tolia, Sunit, DO  metFORMIN (GLUCOPHAGE) 500 MG tablet Take 2 tablets by mouth 2 (two) times daily. 12/06/20   [provider]  metoprolol succinate (TOPROL XL) 25 MG 24 hr tablet  Take 1 tablet (25 mg total) by mouth daily. 10/05/21 08/16/22  Tolia, Sunit, DO  nitroGLYCERIN (NITROSTAT) 0.4 MG SL tablet Place 1 tablet (0.4 mg total) under the tongue every 5 (five) minutes as needed for chest pain. 09/05/23   Rosezella Rumpf, PA-C  omeprazole (PRILOSEC) 20 MG capsule Take 20 mg by mouth daily.    [provider]  ondansetron (ZOFRAN) 4 MG tablet Take 1 tablet (4 mg total) by mouth every 6 (six) hours. 01/20/24   Anders Simmonds T, DO  rosuvastatin (CRESTOR) 10 MG tablet Take 10 mg by mouth daily. 12/06/20   [provider]  vitamin E 1000 UNIT capsule Take 1,000 Units by mouth daily.    [provider]    Family History Family History  Problem Relation Age of Onset   Other Mother    CAD Mother    Asthma Mother    Allergic rhinitis Mother    Lupus Mother    Dementia Mother    Hypertension Mother    Diabetes Mother    Stroke Mother    Allergic rhinitis Father    Dementia Father    Eczema Sister    Bronchitis Sister    Sleep apnea Sister    Allergic rhinitis Daughter    Food Allergy Daughter    Bronchitis Daughter    Allergic rhinitis Grandchild    Eczema Grandchild    Urticaria Grandchild    Bronchitis Grandchild    Allergic rhinitis Grandchild    Bronchitis Grandchild    Sleep apnea Cousin        "most of my cousins on my father's side"   Angioedema Neg Hx    Immunodeficiency Neg Hx    Atopy Neg Hx     Social History Social History   Tobacco Use   Smoking status: Never    Passive exposure: Yes   Smokeless tobacco: Never  Vaping Use   Vaping status: Never Used  Substance Use Topics   Alcohol use: Not Currently    Comment: "very little"   Drug use: Never     Allergies   Bee venom, Pineapple, and Dobutamine   Review of Systems Review of Systems  Constitutional:  Positive for chills and fatigue. Negative for appetite change and fever.  HENT:  Positive for congestion, ear pain and rhinorrhea. Negative for sore  throat.   Respiratory:  Negative for shortness of breath.   Cardiovascular:  Negative for chest pain.  Gastrointestinal:  Negative for diarrhea, nausea and vomiting.  Genitourinary:  Positive for dysuria. Negative for frequency and urgency.  Musculoskeletal:  Positive for back pain.  Skin:  Negative for rash.     Physical Exam Triage Vital Signs ED Triage Vitals  Encounter Vitals Group     BP 02/20/24 1559 (!) 147/84     Systolic BP Percentile --  Diastolic BP Percentile --      Pulse Rate 02/20/24 1559 73     Resp 02/20/24 1559 18     Temp 02/20/24 1559 97.9 F (36.6 C)     Temp Source 02/20/24 1559 Oral     SpO2 02/20/24 1559 98 %     Weight 02/20/24 1610 251 lb (113.9 kg)     Height 02/20/24 1610 5\' 8"  (1.727 m)     Head Circumference --      Peak Flow --      Pain Score 02/20/24 1610 8     Pain Loc --      Pain Education --      Exclude from Growth Chart --    No data found.  Updated Vital Signs BP (!) 147/84 (BP Location: Right Arm)   Pulse 73   Temp 97.9 F (36.6 C) (Oral)   Resp 18   Ht 5\' 8"  (1.727 m)   Wt 251 lb (113.9 kg)   SpO2 98%   BMI 38.16 kg/m   Visual Acuity Right Eye Distance:   Left Eye Distance:   Bilateral Distance:    Right Eye Near:   Left Eye Near:    Bilateral Near:     Physical Exam Vitals and nursing note reviewed.  Constitutional:      Appearance: She is not ill-appearing or toxic-appearing.  HENT:     Head: Normocephalic and atraumatic.     Right Ear: Tympanic membrane and ear canal normal.     Left Ear: Tympanic membrane and ear canal normal.     Nose: Congestion present. No rhinorrhea.     Mouth/Throat:     Mouth: Mucous membranes are moist.     Pharynx: Oropharynx is clear. No posterior oropharyngeal erythema.  Eyes:     General:        Right eye: No discharge.        Left eye: No discharge.     Conjunctiva/sclera: Conjunctivae normal.  Cardiovascular:     Rate and Rhythm: Normal rate and regular rhythm.      Heart sounds: Normal heart sounds.  Pulmonary:     Effort: Pulmonary effort is normal. No respiratory distress.     Breath sounds: Normal breath sounds. No wheezing, rhonchi or rales.  Musculoskeletal:     Cervical back: Neck supple.  Lymphadenopathy:     Cervical: No cervical adenopathy.  Skin:    General: Skin is warm and dry.  Neurological:     Mental Status: She is alert and oriented to person, place, and time.  Psychiatric:        Mood and Affect: Mood normal.      UC Treatments / Results  Labs (all labs ordered are listed, but only abnormal results are displayed) Labs Reviewed - No data to display  EKG   Radiology No results found.  Procedures Procedures (including critical care time)  Medications Ordered in UC Medications - No data to display  Initial Impression / Assessment and Plan / UC Course  I have reviewed the triage vital signs and the nursing notes.  Pertinent labs & imaging results that were available during my care of the patient were reviewed by me and considered in my medical decision making (see chart for details).     63 year old with bodyaches, chills, fatigue yesterday has mild nasal congestion rhinorrhea and sneezing. Vital signs are stable, she is well-appearing, nontoxic, point-of-care COVID negative, point-of-care flu negative.  She did have 1 episode  of dysuria earlier today nonsense will check point-of-care urine.  Point-of-care urine trace ketones trace leuks otherwise normal. Final Clinical Impressions(s) / UC Diagnoses   Final diagnoses:  None   Discharge Instructions   None    ED Prescriptions   None    PDMP not reviewed this encounter.   Meliton Rattan, [place] 02/20/24 [phone]

## 2024-05-25 DIAGNOSIS — Z1231 Encounter for screening mammogram for malignant neoplasm of breast: Secondary | ICD-10-CM | POA: Diagnosis not present

## 2024-05-25 DIAGNOSIS — K760 Fatty (change of) liver, not elsewhere classified: Secondary | ICD-10-CM | POA: Diagnosis not present

## 2024-05-25 DIAGNOSIS — D508 Other iron deficiency anemias: Secondary | ICD-10-CM | POA: Diagnosis not present

## 2024-05-25 DIAGNOSIS — E1129 Type 2 diabetes mellitus with other diabetic kidney complication: Secondary | ICD-10-CM | POA: Diagnosis not present

## 2024-05-25 DIAGNOSIS — R160 Hepatomegaly, not elsewhere classified: Secondary | ICD-10-CM | POA: Diagnosis not present

## 2024-05-25 DIAGNOSIS — E782 Mixed hyperlipidemia: Secondary | ICD-10-CM | POA: Diagnosis not present

## 2024-05-25 DIAGNOSIS — R718 Other abnormality of red blood cells: Secondary | ICD-10-CM | POA: Diagnosis not present

## 2024-05-25 DIAGNOSIS — M79645 Pain in left finger(s): Secondary | ICD-10-CM | POA: Diagnosis not present

## 2024-05-25 DIAGNOSIS — I10 Essential (primary) hypertension: Secondary | ICD-10-CM | POA: Diagnosis not present

## 2024-05-25 DIAGNOSIS — I700 Atherosclerosis of aorta: Secondary | ICD-10-CM | POA: Diagnosis not present

## 2024-06-16 DIAGNOSIS — M79645 Pain in left finger(s): Secondary | ICD-10-CM | POA: Diagnosis not present

## 2024-06-16 DIAGNOSIS — R200 Anesthesia of skin: Secondary | ICD-10-CM | POA: Diagnosis not present

## 2024-06-16 DIAGNOSIS — M542 Cervicalgia: Secondary | ICD-10-CM | POA: Diagnosis not present

## 2024-06-16 DIAGNOSIS — H16223 Keratoconjunctivitis sicca, not specified as Sjogren's, bilateral: Secondary | ICD-10-CM | POA: Diagnosis not present

## 2024-06-16 DIAGNOSIS — H43393 Other vitreous opacities, bilateral: Secondary | ICD-10-CM | POA: Diagnosis not present

## 2024-06-18 DIAGNOSIS — E119 Type 2 diabetes mellitus without complications: Secondary | ICD-10-CM | POA: Diagnosis not present

## 2024-06-18 DIAGNOSIS — I10 Essential (primary) hypertension: Secondary | ICD-10-CM | POA: Diagnosis not present

## 2024-06-18 DIAGNOSIS — Z8262 Family history of osteoporosis: Secondary | ICD-10-CM | POA: Diagnosis not present

## 2024-06-18 DIAGNOSIS — E782 Mixed hyperlipidemia: Secondary | ICD-10-CM | POA: Diagnosis not present

## 2024-06-18 DIAGNOSIS — M8588 Other specified disorders of bone density and structure, other site: Secondary | ICD-10-CM | POA: Diagnosis not present

## 2024-06-29 DIAGNOSIS — K760 Fatty (change of) liver, not elsewhere classified: Secondary | ICD-10-CM | POA: Diagnosis not present

## 2024-06-29 DIAGNOSIS — M818 Other osteoporosis without current pathological fracture: Secondary | ICD-10-CM | POA: Diagnosis not present

## 2024-06-29 DIAGNOSIS — I700 Atherosclerosis of aorta: Secondary | ICD-10-CM | POA: Diagnosis not present

## 2024-06-29 DIAGNOSIS — I10 Essential (primary) hypertension: Secondary | ICD-10-CM | POA: Diagnosis not present

## 2024-06-29 DIAGNOSIS — R160 Hepatomegaly, not elsewhere classified: Secondary | ICD-10-CM | POA: Diagnosis not present

## 2024-06-29 DIAGNOSIS — E782 Mixed hyperlipidemia: Secondary | ICD-10-CM | POA: Diagnosis not present

## 2024-06-29 DIAGNOSIS — E1129 Type 2 diabetes mellitus with other diabetic kidney complication: Secondary | ICD-10-CM | POA: Diagnosis not present

## 2024-07-06 DIAGNOSIS — M65312 Trigger thumb, left thumb: Secondary | ICD-10-CM | POA: Diagnosis not present

## 2024-07-09 DIAGNOSIS — I10 Essential (primary) hypertension: Secondary | ICD-10-CM | POA: Diagnosis not present

## 2024-07-09 DIAGNOSIS — H60392 Other infective otitis externa, left ear: Secondary | ICD-10-CM | POA: Diagnosis not present

## 2024-07-23 ENCOUNTER — Other Ambulatory Visit: Payer: Self-pay

## 2024-07-23 ENCOUNTER — Ambulatory Visit
Admission: EM | Admit: 2024-07-23 | Discharge: 2024-07-23 | Disposition: A | Discharge status: Home / Self Care | Attending: Physician Assistant | Admitting: Physician Assistant

## 2024-07-23 ENCOUNTER — Ambulatory Visit (INDEPENDENT_AMBULATORY_CARE_PROVIDER_SITE_OTHER): Admitting: Radiology

## 2024-07-23 DIAGNOSIS — M79641 Pain in right hand: Secondary | ICD-10-CM | POA: Diagnosis not present

## 2024-07-23 DIAGNOSIS — M25531 Pain in right wrist: Secondary | ICD-10-CM

## 2024-07-23 DIAGNOSIS — M19041 Primary osteoarthritis, right hand: Secondary | ICD-10-CM | POA: Diagnosis not present
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
## 2024-07-28 DIAGNOSIS — E119 Type 2 diabetes mellitus without complications: Secondary | ICD-10-CM | POA: Diagnosis not present

## 2024-07-30 DIAGNOSIS — I10 Essential (primary) hypertension: Secondary | ICD-10-CM | POA: Diagnosis not present

## 2024-07-30 DIAGNOSIS — R0602 Shortness of breath: Secondary | ICD-10-CM | POA: Diagnosis not present

## 2024-07-30 DIAGNOSIS — R001 Bradycardia, unspecified: Secondary | ICD-10-CM | POA: Diagnosis not present

## 2024-07-30 DIAGNOSIS — T40605A Adverse effect of unspecified narcotics, initial encounter: Secondary | ICD-10-CM | POA: Diagnosis not present

## 2024-07-30 DIAGNOSIS — G8918 Other acute postprocedural pain: Secondary | ICD-10-CM | POA: Diagnosis not present

## 2024-07-30 DIAGNOSIS — M79645 Pain in left finger(s): Secondary | ICD-10-CM | POA: Diagnosis not present

## 2024-07-30 DIAGNOSIS — T50905A Adverse effect of unspecified drugs, medicaments and biological substances, initial encounter: Secondary | ICD-10-CM | POA: Diagnosis not present

## 2024-07-30 DIAGNOSIS — M65312 Trigger thumb, left thumb: Secondary | ICD-10-CM | POA: Diagnosis not present

## 2024-07-30 NOTE — ED Provider Notes (Addendum)
 Patient placed in First Look pathway, seen and evaluated for chief complaint of nausea, feeling hot and sweaty, no chest pain s/p surgery this AM.  Called surgery center and told to take 1/2 of a Benadryl  tablet and she did.  Had some SOB. Pertinent HPI findings include trigger finger surgery on Lt thumb today. Pertinent exam findings include alert, appears sleepy, breathing comfortably on RA, answering questions appropriately. Based on initial evaluation, labs are currently indicated and radiology studies are not currently indicated as allowed for current processes and treatments as applicable in a triage setting and could be different than if patient were seen in a main treatment area or dependent on labs/imagining after results are displayed.  Patient counseled on process, plan, and necessity for staying for completing the evaluation.   This document serves as a record of services personally performed by Lynwood KYM Louder, III, PA-C.    [facility] Emergency Department Emergency Department Provider Note  Provider at Bedside:  07/30/2024 5:04 PM  Chief Complaint: Nausea, medication reaction  History of Present Illness:  History obtained from: Patient  Shannon Rosario is a 63 y.o. female with PMHx of CAD, DM2, HTN, HLD, TIA, GERD, OSA, GAD, and MDD, who presents to the ED with complaints of nausea and diaphoresis, onset just prior to arrival. Patient underwent release of trigger finger this morning and was discharged feeling at her baseline. She went home and took 1 tablet of Norco. She suddenly felt extremely nauseous and diaphoretic. She called surgical center who advised her to take 1/2 Benadryl  tablet which caused her to feel short of breath. Patient notes taking benadryl  previously without any complications. She denies taking any pain medication in the past. Patient denies fever, chills, abdominal pain, vomiting, diarrhea, shortness of breath, chest pain, sensation of  throat closing, new rashes or new hives. Patient denies any other medical complaints at this time.  ______________________ ROS: Pertinent positives and negatives per HPI. Pertinent past medical, surgical, social and family history records were reviewed. Current Medications and Allergies were reviewed.  Physical Exam   Vitals:   07/30/24 1632 07/30/24 1651  BP: (!) 190/96   BP Location: Right arm   Patient Position: Sitting   Pulse: 54   Resp: 16   Temp: 97.6 F (36.4 C)   TempSrc: Oral   SpO2: 98% 98%  Weight: 113 kg (250 lb)   Height: 172.7 cm (5' 8)       Physical Exam Vitals and nursing note reviewed.  Constitutional:      General: She is not in acute distress.    Appearance: Normal appearance.  HENT:     Head: Normocephalic and atraumatic.     Right Ear: External ear normal.     Left Ear: External ear normal.     Nose: Nose normal.     Mouth/Throat:     Mouth: Mucous membranes are moist.   Eyes:     Pupils: Pupils are equal, round, and reactive to light.    Cardiovascular:     Rate and Rhythm: Normal rate and regular rhythm.     Heart sounds: Normal heart sounds.  Pulmonary:     Effort: Pulmonary effort is normal.     Breath sounds: Normal breath sounds.  Abdominal:     Palpations: Abdomen is soft.     Tenderness: There is no abdominal tenderness.   Musculoskeletal:        General: Normal range of motion.     Cervical  back: Normal range of motion.     Comments: Post-op soft dressing in place to left upper extremity. Patient's LUE is in a sling. Patient is able to move left 2-5th digits. Left 1st digit ROM limited secondary to surgical dressing.    Skin:    General: Skin is warm.     Findings: No rash.   Neurological:     General: No focal deficit present.     Mental Status: She is alert and oriented to person, place, and time.   Psychiatric:        Mood and Affect: Mood normal.        Behavior: Behavior normal.        Thought Content: Thought  content normal.     Results   EKG Impression:  My Interpretation: EKG For acute MI, arrhythmia, conduction abnormality, ischemia, electrolyte abnormality was performed and showed EKG time: 1636 Interpretation time: 5:55 PM Rate: 58 Rhythm: sinus bradycardia Axis: Normal Intervals: R wave progression with some Q waves inferiorly ST-T Waves: Nonspecific T wave abnormality Comparison with Old:  No  Labs: Lab Results (last 24 hours)     Procedure Component Value Ref Range Date/Time   CBC with Differential [[id number]]  (Abnormal) Collected: 07/30/24 1705   Lab Status: Final result Specimen: Blood from Venous Updated: 07/30/24 1717   Narrative:     The following orders were created for panel order CBC with Differential. Procedure                               Abnormality         Status                    ---------                               -----------         ------                    CBC with Differential[[id number]]       Abnormal            Final result               Please view results for these tests on the individual orders.   Comprehensive Metabolic Panel [[id number]]  (Abnormal) Collected: 07/30/24 1705   Lab Status: Final result Specimen: Blood from Venous Updated: 07/30/24 1744    Sodium 136 136 - 145 mmol/L     Potassium 3.8 3.4 - 4.5 mmol/L     Chloride 105 98 - 107 mmol/L     CO2 23 21 - 31 mmol/L     Anion Gap 8 6 - 14 mmol/L     Glucose, Random 132* 70 - 99 mg/dL     Blood Urea Nitrogen (BUN) 12 7 - 25 mg/dL     Creatinine 9.20 9.39 - 1.20 mg/dL     eGFR 84 >40 fO/fpw/8.26f7     Comment: GFR estimated by CKD-EPI equations(NKF 2021).   Recommend confirmation of Cr-based eGFR by using Cys-based eGFR and other filtration markers (if applicable) in complex cases and clinical decision-making, as needed.      Albumin 4.2 3.5 - 5.7 g/dL     Total Protein 7.0 6.4 - 8.9 g/dL     Bilirubin, Total 0.5 0.3 - 1.0 mg/dL  Alkaline Phosphatase (ALP) 54 34 - 104 U/L      Aspartate Aminotransferase (AST) 24 13 - 39 U/L     Alanine Aminotransferase (ALT) 20 7 - 52 U/L     Calcium  9.1 8.6 - 10.3 mg/dL     BUN/Creatinine Ratio --    Comment: Creatinine is normal, ratio is not clinically indicated.      Troponin, High Sensitive (0 Hr + 2 Hr Rfx) [[id number]] Collected: 07/30/24 1705   Lab Status: In process Specimen: Blood from Venous Updated: 07/30/24 1712   CBC with Differential [[id number]]  (Abnormal) Collected: 07/30/24 1705   Lab Status: Final result Specimen: Blood from Venous Updated: 07/30/24 1717    WBC 7.56 4.40 - 11.00 10*3/uL     RBC 5.20* 4.10 - 5.10 10*6/uL     Hemoglobin 12.7 12.3 - 15.3 g/dL     Hematocrit 60.9 64.0 - 44.6 %     Mean Corpuscular Volume (MCV) 74.9* 80.0 - 96.0 fL     Mean Corpuscular Hemoglobin (MCH) 24.3* 27.5 - 33.2 pg     Mean Corpuscular Hemoglobin Conc (MCHC) 32.5* 33.0 - 37.0 g/dL     Red Cell Distribution Width (RDW) 15.6 12.3 - 17.0 %     Platelet Count (PLT) 268 150 - 450 10*3/uL     Mean Platelet Volume (MPV) 8.7 6.8 - 10.2 fL     Neutrophils % 53 %     Lymphocytes % 37 %     Monocytes % 7 %     Eosinophils % 3 %     Basophils % 1 %     Neutrophils Absolute 4.00 1.80 - 7.80 10*3/uL     Lymphocytes # 2.80 1.00 - 4.80 10*3/uL     Monocytes # 0.50 0.00 - 0.80 10*3/uL     Eosinophils # 0.20 0.00 - 0.50 10*3/uL     Basophils # 0.00 0.00 - 0.20 10*3/uL    POC Glucose [[id number]]  (Abnormal) Collected: 07/30/24 0838   Lab Status: Final result Specimen: Blood from Capillary Updated: 07/30/24 0839    Glucose, POC 123* 70 - 99 mg/dL    POC Glucose [[id number]]  (Abnormal) Collected: 07/30/24 0741   Lab Status: Final result Specimen: Blood from Capillary Updated: 07/30/24 0742    Glucose, POC 158* 70 - 99 mg/dL     Comment: Pre-Meal         Interpretation of patient's labs shows that her white blood cell count is normal, she is mildly anemic, and her chemistries are essentially normal except for glucose of  132.  CXR Impression: (Interpreted by me) My Interpretation: Chest xray for pneumonia, pneumothorax, pleural effusion, pulmonary edema, cardiomegaly, aortic abnormality was performed and did not show any acute cardiopulmonary findings.  Impression of additional imaging studies include: Additional imaging performed  Imaging: Radiology Results (last 72 hours)     Procedure Component Value Units Date/Time   XR Chest 1 View [[id number]] Collected: 07/30/24 1659   Order Status: Completed Updated: 07/30/24 1703   Narrative:     CLINICAL DATA:  Dyspnea, short of breath  EXAM: CHEST  1 VIEW  COMPARISON:  09/05/2023  FINDINGS: Single frontal view of the chest demonstrates a stable cardiac silhouette. No acute airspace disease, effusion, or pneumothorax. Minimal scarring or subsegmental atelectasis at the left lung base unchanged. No acute bony abnormalities.    Impression:     1. Stable chest, no acute process.   Electronically Signed   By: Ozell Delores HERO.D.  On: 07/30/2024 17:00         Procedures   Procedures  Evidence Based Calculators      ED Course   ED Course as of 07/30/24 1755  Thu Jul 30, 2024  1736 Patient vital signs are stable.  He is mildly hypertensive. [LT]  1736 Intervention-presents patient was brought to the ER and she has been in the ER she actually feels much better.  Patient feels like the symptoms all started after she took the oxycodone  pain medicine that she was given and we feel like she had not really eaten anything, she got extremely nauseous, she felt diaphoretic and flushed, and when she took the half of Benadryl  she did not feel any better but I feel like her reaction was to the oxycodone  and not the Benadryl .  In the ED patient feels much better, she is not having any shortness of breath, her nausea is improving, she said her bandage just feels tight around where they did the surgery but she does not have any numbness or tingling or  difficulty with movement.  Patient was given some clear liquids and some crackers to eat in the ER and was much improved. [LT]  1737 Disposition-discharge.  Patient will be called in for some tramadol to take for pain because it was definitely slight from her reaction and that the oxycodone  is too strong for her.  Patient was told to return to the ER course if condition worsens. [LT]    ED Course User Index [LT] Rock Dannielle Birmingham, MD    Medical Decision Making   External records were reviewed: Reviewed Op note from orthopedics surgery on 07/30/2024 (today) which showed patient underwent release trigger finger of left thumb performed by [place] Miller Case, MD.  _________________________  Shannon Rosario is a 63 y.o. female ho presents to the ED with complaints of nausea and diaphoresis, onset just prior to arrival. Patient underwent release of trigger finger this morning and was discharged feeling at her baseline. She went home and took 1 tablet of Norco. She suddenly felt extremely nauseous and diaphoretic. She called surgical center who advised her to take 1/2 Benadryl  tablet which caused her to feel short of breath. Patient notes taking benadryl  previously without any complications. She denies taking any pain medication in the past. Patient denies fever, chills, abdominal pain, vomiting, diarrhea, shortness of breath, chest pain, sensation of throat closing, new rashes or new hives. Patient denies any other medical complaints at this time. . On my initial evaluation, no acute distress and all of her symptoms have resolved.  She was complaining that her left hand felt tight in the dressing but she was able to move all of her digits without any difficulty..  The following differentials were considered: anaphylaxis, angioedema, arrhythmias, bronchospasm, carcinoid, epiglottis, FB/airway obstruction, hereditary angioedema, mastocytosis, myocardial ischemia, non IgE mediated drug rx, seizure,  status asthmaticus, urticarial, vasovagal reactions, vocal cord dysfunction .    Pertinent studies were obtained, with results listed in chart above.  results interpreted as above.  Based on ED workup, findings are consistent with probable patient reaction to pain medicine.    Patient received no medications in the ER.  On reevaluation, symptoms are improved.  Clinical Assessment/Plan: Patient presented to the ER and it was felt that all of her symptoms occurred after she took a dose of the oxycodone .  Patient got extremely nauseous and felt like she was getting hot and going to pass out and we feel like  it was just a side effect of the medication.  Patient had not eaten much since the surgery and was kind of taking it on an empty stomach.  When she did the half dose of Benadryl  when she called the office, I do not feel that was what caused her symptoms I feel like it was caused all from the pain medicine.  Patient symptoms had completely resolved in the ER, she was complaining that the bandage to her left hand felt slightly tight around the thumb, but she was able to move the thumb without any difficulty all of her fingers, she was distally neurovascular intact and it was only a bandage and no splints.  Told the patient to make sure to elevate her left hand tonight and just follow-up with Ortho as scheduled.  Discussion of management or test interpretation with external provider(s): No external providers needed   Clinical Complexity/Risk   Patient's presentation is most consistent with acute presentation with potential threat to life or bodily function.  Patient's recent surgery increases the complexity of managing their  presentation with allergic reaction.    Pt presentation is complicated by their history of CVA/TIA, DM, GERD/acid reflux/previous GI bleed, Heart Disease-CAD, MI, stents, etc, High Cholesterol, and HTN, resulting in increased risk of frequent ED usage  Provider time spent in  patient care today, inclusive of but not limited to clinical reassessment, review of diagnostic studies, and discharge preparation, was greater than 30 minutes.  OTC medications: no, none Prescription medications discussed: yes, tramadol to use as needed  ED Clinical Impression   Diagnoses that have been ruled out:  None  Diagnoses that are still under consideration:  None  Final diagnoses:  Adverse effect of drug, initial encounter    ED Assessment/Plan   ED Disposition     ED Disposition  Discharge   Condition  Stable   Comment  --         DISCHARGE MEDICATIONS   Medication List     START taking these medications    traMADoL 50 mg tablet Commonly known as: ULTRAM Take 1 tablet (50 mg total) by mouth every 6 (six) hours as needed for moderate pain (4-6).       ASK your doctor about these medications    aspirin  81 mg EC tablet Take 81 mg by mouth.   cholecalciferol 1,000 unit (25 mcg) tablet Commonly known as: VITAMIN D3 Take 1,000 Units by mouth.   * dulaglutide 0.75 mg/0.5 mL subcutaneous pen injector Commonly known as: TRULICITY Inject 0.5 mL (0.75 mg total) under the skin every 7 days.   * dulaglutide 1.5 mg/0.5 mL subcutaneous pen injector Commonly known as: TRULICITY Inject 0.5 mL (1.5 mg total) under the skin once a week.   EPINEPHrine  0.3 mg/0.3 mL injection syringe Commonly known as: EPIPEN  into the thigh.   fluticasone propionate 50 mcg/spray nasal spray Commonly known as: FLONASE 2 sprays Once Daily for 30 days.   glimepiride 4 mg tablet Commonly known as: AMARYL Take 1 tablet (4 mg total) by mouth in the morning and 1 tablet (4 mg total) in the evening. Take with meals.   hydroCHLOROthiazide  25 mg tablet Commonly known as: HYDRODIURIL  Take 25 mg by mouth.   HYDROcodone-acetaminophen  5-325 mg per tablet Commonly known as: NORCO Take 1 tablet by mouth every 6 (six) hours as needed (pain).   losartan  25 mg tablet Commonly  known as: COZAAR  Take 25 mg by mouth nightly.   magnesium  oxide 400 mg (241  mg magnesium ) Tab Take 1 tablet by mouth in the morning and 1 tablet at noon.   metFORMIN 1,000 mg tablet Commonly known as: GLUCOPHAGE Take 1 tablet (1,000 mg total) by mouth in the morning and 1 tablet (1,000 mg total) in the evening. Take with meals.   metoprolol  succinate 25 mg 24 hr tablet Commonly known as: TOPROL  XL   nitroglycerin  0.4 mg SL tablet Commonly known as: NITROSTAT    omeprazole 20 mg DR capsule Commonly known as: PriLOSEC Take 20 mg by mouth daily.   Restasis 0.05 % ophthalmic emulsion Generic drug: cycloSPORINE INSTILL 1 DROP(S) IN EACH EYE BY OPHTHALMIC ROUTE TWICE DAILY   rosuvastatin  10 mg tablet Commonly known as: CRESTOR  Take 10 mg by mouth daily.   vitamin E 45 mg (100 unit) capsule Take 100 Units by mouth daily.      * * This list has 2 medication(s) that are the same as other medications prescribed for you. Read the directions carefully, and ask your doctor or other care provider to review them with you.            Where to Get Your Medications     These medications were sent to [id number] - [place], [place] - [address] - PHONE: [phone] - FAX: [phone]  [address], [place] [address]    Phone: [phone]  traMADoL 50 mg tablet     FOLLOW UP Zachary Conger, MD [address] [place] [address] [phone]  In 1 week For Recheck  Your orthopedic surgeon   As scheduled for follow-up from the surgery you had today.   ____________________________ Scribe's Attestation: This document serves as a record of services personally performed by Rock Birmingham, MD. It was created on their behalf by Lavanda Pang, Scribe, a trained medical scribe. The creation of this record is the provider's dictation and/or activities during the visit.   Electronically signed by: Rock Dannielle Birmingham, MD 07/30/2024 5:04  PM      *Some images could not be shown.

## 2024-08-05 DIAGNOSIS — S40021A Contusion of right upper arm, initial encounter: Secondary | ICD-10-CM | POA: Diagnosis not present

## 2024-08-05 DIAGNOSIS — M25511 Pain in right shoulder: Secondary | ICD-10-CM | POA: Diagnosis not present

## 2024-08-05 DIAGNOSIS — M79641 Pain in right hand: Secondary | ICD-10-CM | POA: Diagnosis not present

## 2024-08-05 DIAGNOSIS — M7551 Bursitis of right shoulder: Secondary | ICD-10-CM | POA: Diagnosis not present

## 2024-08-05 DIAGNOSIS — M62830 Muscle spasm of back: Secondary | ICD-10-CM | POA: Diagnosis not present

## 2024-08-17 DIAGNOSIS — Z9889 Other specified postprocedural states: Secondary | ICD-10-CM | POA: Diagnosis not present

## 2024-08-17 DIAGNOSIS — M65312 Trigger thumb, left thumb: Secondary | ICD-10-CM | POA: Diagnosis not present

## 2024-08-18 DIAGNOSIS — Z7984 Long term (current) use of oral hypoglycemic drugs: Secondary | ICD-10-CM | POA: Diagnosis not present

## 2024-08-18 DIAGNOSIS — E119 Type 2 diabetes mellitus without complications: Secondary | ICD-10-CM | POA: Diagnosis not present

## 2024-08-18 DIAGNOSIS — K429 Umbilical hernia without obstruction or gangrene: Secondary | ICD-10-CM | POA: Diagnosis not present

## 2024-08-20 DIAGNOSIS — H66005 Acute suppurative otitis media without spontaneous rupture of ear drum, recurrent, left ear: Secondary | ICD-10-CM | POA: Diagnosis not present

## 2024-08-20 DIAGNOSIS — H16223 Keratoconjunctivitis sicca, not specified as Sjogren's, bilateral: Secondary | ICD-10-CM | POA: Diagnosis not present

## 2024-08-20 DIAGNOSIS — H60312 Diffuse otitis externa, left ear: Secondary | ICD-10-CM | POA: Diagnosis not present

## 2024-08-20 DIAGNOSIS — J301 Allergic rhinitis due to pollen: Secondary | ICD-10-CM | POA: Diagnosis not present

## 2024-08-20 DIAGNOSIS — I10 Essential (primary) hypertension: Secondary | ICD-10-CM | POA: Diagnosis not present

## 2024-08-28 DIAGNOSIS — E119 Type 2 diabetes mellitus without complications: Secondary | ICD-10-CM | POA: Diagnosis not present

## 2024-08-28 DIAGNOSIS — Z7984 Long term (current) use of oral hypoglycemic drugs: Secondary | ICD-10-CM | POA: Diagnosis not present

## 2024-08-31 DIAGNOSIS — Z0001 Encounter for general adult medical examination with abnormal findings: Secondary | ICD-10-CM | POA: Diagnosis not present

## 2024-08-31 DIAGNOSIS — I10 Essential (primary) hypertension: Secondary | ICD-10-CM | POA: Diagnosis not present

## 2024-08-31 DIAGNOSIS — E782 Mixed hyperlipidemia: Secondary | ICD-10-CM | POA: Diagnosis not present

## 2024-08-31 DIAGNOSIS — H6690 Otitis media, unspecified, unspecified ear: Secondary | ICD-10-CM | POA: Diagnosis not present

## 2024-08-31 DIAGNOSIS — E1165 Type 2 diabetes mellitus with hyperglycemia: Secondary | ICD-10-CM | POA: Diagnosis not present

## 2024-09-01 DIAGNOSIS — K429 Umbilical hernia without obstruction or gangrene: Secondary | ICD-10-CM | POA: Diagnosis not present

## 2024-09-11 DIAGNOSIS — Z09 Encounter for follow-up examination after completed treatment for conditions other than malignant neoplasm: Secondary | ICD-10-CM | POA: Diagnosis not present

## 2024-09-11 NOTE — Progress Notes (Signed)
 Interval History: Shannon Rosario is a 63 y.o. female presenting today 5 weeks s/p below procedure.  Pt states she is still having some numbness at the tip of her Left Thumb. No triggering or pain.   Date of Surgery: 07/30/2024   Pre-op Diagnosis:     * Trigger finger of left thumb [M65.312]   Postoperative Diagnosis:       * Trigger finger of left thumb [M65.312]   Procedure(s): RELEASE TRIGGER FINGER THUMB, LEFT   Exam:  Incision is clean and intact. Well healed. No sign of infection, no erythema or drainage.  Some mild numbness along the distal aspect of radial digital nerve  Imaging: No new imaging was obtained today.  Assessment: 5 weeks s/p above procedure  Plan:  Patient is making good, gradual progress postoperatively. Her pain is well controlled and her range of motion is improving. The numbness she is experiencing should be gone with time. Advised pt to  observe the symptoms. I advised her to avoid activities that cause sharp pain. Follow up as needed.  This document serves as a record of services personally performed by [place] Case, MD. It was created on their behalf by Morgan VEAR Gee, Scribe, a trained medical scribe. The creation of this record is the provider's dictation and/or activities during the visit.   Electronically signed by: Morgan VEAR Gee, Scribe 09/11/2024 12:58 PM  I agree the documentation is accurate and complete.  Electronically signed by: [place] Case, MD 09/11/2024 12:58 PM

## 2024-09-16 DIAGNOSIS — E119 Type 2 diabetes mellitus without complications: Secondary | ICD-10-CM | POA: Diagnosis not present

## 2024-10-07 DIAGNOSIS — S39012A Strain of muscle, fascia and tendon of lower back, initial encounter: Secondary | ICD-10-CM | POA: Diagnosis not present

## 2024-10-07 DIAGNOSIS — Z636 Dependent relative needing care at home: Secondary | ICD-10-CM | POA: Diagnosis not present

## 2024-10-07 DIAGNOSIS — M542 Cervicalgia: Secondary | ICD-10-CM | POA: Diagnosis not present

## 2024-10-07 DIAGNOSIS — M5412 Radiculopathy, cervical region: Secondary | ICD-10-CM | POA: Diagnosis not present

## 2024-10-07 DIAGNOSIS — M545 Low back pain, unspecified: Secondary | ICD-10-CM | POA: Diagnosis not present

## 2024-10-08 DIAGNOSIS — E785 Hyperlipidemia, unspecified: Secondary | ICD-10-CM | POA: Diagnosis not present

## 2024-10-08 DIAGNOSIS — I110 Hypertensive heart disease with heart failure: Secondary | ICD-10-CM | POA: Diagnosis not present

## 2024-10-08 DIAGNOSIS — I509 Heart failure, unspecified: Secondary | ICD-10-CM | POA: Diagnosis not present

## 2024-10-08 DIAGNOSIS — I700 Atherosclerosis of aorta: Secondary | ICD-10-CM | POA: Diagnosis not present

## 2024-10-08 DIAGNOSIS — R911 Solitary pulmonary nodule: Secondary | ICD-10-CM | POA: Diagnosis not present

## 2024-10-08 DIAGNOSIS — E1159 Type 2 diabetes mellitus with other circulatory complications: Secondary | ICD-10-CM | POA: Diagnosis not present

## 2024-10-08 DIAGNOSIS — K760 Fatty (change of) liver, not elsewhere classified: Secondary | ICD-10-CM | POA: Diagnosis not present

## 2024-10-08 DIAGNOSIS — Z0000 Encounter for general adult medical examination without abnormal findings: Secondary | ICD-10-CM | POA: Diagnosis not present

## 2024-10-08 DIAGNOSIS — K5790 Diverticulosis of intestine, part unspecified, without perforation or abscess without bleeding: Secondary | ICD-10-CM | POA: Diagnosis not present

## 2024-10-08 DIAGNOSIS — G4733 Obstructive sleep apnea (adult) (pediatric): Secondary | ICD-10-CM | POA: Diagnosis not present

## 2024-10-08 DIAGNOSIS — I25118 Atherosclerotic heart disease of native coronary artery with other forms of angina pectoris: Secondary | ICD-10-CM | POA: Diagnosis not present

## 2024-10-15 DIAGNOSIS — E559 Vitamin D deficiency, unspecified: Secondary | ICD-10-CM | POA: Diagnosis not present

## 2024-10-15 DIAGNOSIS — E1165 Type 2 diabetes mellitus with hyperglycemia: Secondary | ICD-10-CM | POA: Diagnosis not present

## 2024-10-15 DIAGNOSIS — I10 Essential (primary) hypertension: Secondary | ICD-10-CM | POA: Diagnosis not present

## 2024-10-15 DIAGNOSIS — K760 Fatty (change of) liver, not elsewhere classified: Secondary | ICD-10-CM | POA: Diagnosis not present

## 2024-10-15 DIAGNOSIS — E782 Mixed hyperlipidemia: Secondary | ICD-10-CM | POA: Diagnosis not present

## 2024-10-16 ENCOUNTER — Other Ambulatory Visit: Payer: Self-pay

## 2024-10-16 ENCOUNTER — Emergency Department (HOSPITAL_COMMUNITY)

## 2024-10-16 ENCOUNTER — Encounter (HOSPITAL_COMMUNITY): Payer: Self-pay

## 2024-10-16 ENCOUNTER — Emergency Department (HOSPITAL_COMMUNITY)
Admission: EM | Admit: 2024-10-16 | Discharge: 2024-10-16 | Disposition: A | Discharge status: Home / Self Care | Attending: Emergency Medicine | Admitting: Emergency Medicine

## 2024-10-16 DIAGNOSIS — Z7984 Long term (current) use of oral hypoglycemic drugs: Secondary | ICD-10-CM | POA: Diagnosis not present

## 2024-10-16 DIAGNOSIS — M79605 Pain in left leg: Secondary | ICD-10-CM | POA: Diagnosis not present

## 2024-10-16 DIAGNOSIS — E119 Type 2 diabetes mellitus without complications: Secondary | ICD-10-CM | POA: Insufficient documentation

## 2024-10-16 DIAGNOSIS — M545 Low back pain, unspecified: Secondary | ICD-10-CM | POA: Diagnosis present

## 2024-10-16 DIAGNOSIS — M79604 Pain in right leg: Secondary | ICD-10-CM | POA: Diagnosis not present

## 2024-10-16 DIAGNOSIS — M549 Dorsalgia, unspecified: Secondary | ICD-10-CM | POA: Diagnosis not present

## 2024-10-16 DIAGNOSIS — Z7982 Long term (current) use of aspirin: Secondary | ICD-10-CM | POA: Insufficient documentation

## 2024-10-16 DIAGNOSIS — M5137 Other intervertebral disc degeneration, lumbosacral region with discogenic back pain only: Secondary | ICD-10-CM | POA: Diagnosis not present

## 2024-10-16 DIAGNOSIS — Z79899 Other long term (current) drug therapy: Secondary | ICD-10-CM | POA: Diagnosis not present

## 2024-10-16 DIAGNOSIS — M5136 Other intervertebral disc degeneration, lumbar region with discogenic back pain only: Secondary | ICD-10-CM | POA: Diagnosis not present

## 2024-10-16 DIAGNOSIS — M48061 Spinal stenosis, lumbar region without neurogenic claudication: Secondary | ICD-10-CM | POA: Diagnosis not present

## 2024-10-16 DIAGNOSIS — X509XXA Other and unspecified overexertion or strenuous movements or postures, initial encounter: Secondary | ICD-10-CM | POA: Insufficient documentation

## 2024-10-16 DIAGNOSIS — I10 Essential (primary) hypertension: Secondary | ICD-10-CM | POA: Insufficient documentation

## 2024-10-16 DIAGNOSIS — M47816 Spondylosis without myelopathy or radiculopathy, lumbar region: Secondary | ICD-10-CM | POA: Diagnosis not present

## 2024-10-16 DIAGNOSIS — S39012A Strain of muscle, fascia and tendon of lower back, initial encounter: Secondary | ICD-10-CM | POA: Diagnosis not present

## 2024-10-16 LAB — BASIC METABOLIC PANEL WITH GFR
Anion gap: 14 (ref 5–15)
BUN: 10 mg/dL (ref 8–23)
CO2: 22 mmol/L (ref 22–32)
Calcium: 8.9 mg/dL (ref 8.9–10.3)
Chloride: 100 mmol/L (ref 98–111)
Creatinine, Ser: 0.83 mg/dL (ref 0.44–1.00)
GFR, Estimated: >60 mL/min (ref >60)
Glucose, Bld: 184 mg/dL — ABNORMAL HIGH (ref 70–99)
Potassium: 3.8 mmol/L (ref 3.5–5.1)
Sodium: 136 mmol/L (ref 135–145)

## 2024-10-16 LAB — CBC WITH DIFFERENTIAL/PLATELET
Abs Immature Granulocytes: 0.02 K/uL (ref 0.00–0.07)
Basophils Absolute: 0.1 K/uL (ref 0.0–0.1)
Basophils Relative: 1 %
Eosinophils Absolute: 0.3 K/uL (ref 0.0–0.5)
Eosinophils Relative: 3 %
HCT: 39.3 % (ref 36.0–46.0)
Hemoglobin: 12.9 g/dL (ref 12.0–15.0)
Immature Granulocytes: 0 %
Lymphocytes Relative: 23 %
Lymphs Abs: 2 K/uL (ref 0.7–4.0)
MCH: 24.8 pg — ABNORMAL LOW (ref 26.0–34.0)
MCHC: 32.8 g/dL (ref 30.0–36.0)
MCV: 75.4 fL — ABNORMAL LOW (ref 80.0–100.0)
Monocytes Absolute: 0.4 K/uL (ref 0.1–1.0)
Monocytes Relative: 5 %
Neutro Abs: 6.1 K/uL (ref 1.7–7.7)
Neutrophils Relative %: 68 %
Platelets: 296 K/uL (ref 150–400)
RBC: 5.21 MIL/uL — ABNORMAL HIGH (ref 3.87–5.11)
RDW: 14.6 % (ref 11.5–15.5)
WBC: 8.8 K/uL (ref 4.0–10.5)
nRBC: 0 % (ref 0.0–0.2)

## 2024-10-16 LAB — URINALYSIS, ROUTINE W REFLEX MICROSCOPIC
Bilirubin Urine: NEGATIVE
Glucose, UA: NEGATIVE mg/dL
Hgb urine dipstick: NEGATIVE
Ketones, ur: NEGATIVE mg/dL
Leukocytes,Ua: NEGATIVE
Nitrite: NEGATIVE
Protein, ur: NEGATIVE mg/dL
Specific Gravity, Urine: 1.021 (ref 1.005–1.030)
pH: 6 (ref 5.0–8.0)

## 2024-10-16 MED ORDER — DIAZEPAM 5 MG PO TABS
5.0000 mg | ORAL_TABLET | Freq: Once | ORAL | Status: AC
  Administered 2024-10-16: 5 mg via ORAL
  Filled 2024-10-16: qty 1

## 2024-10-16 NOTE — Discharge Instructions (Signed)
 You were seen in the emergency room for back pain The MRI did not show any evidence of problems with the spinal cord but did show your usual degenerative changes You are able to walk on your own to the bathroom Follow-up with your primary doctor within 1 week for reevaluation Return to the Emergency Department for severe pain, weakness in your legs or any other concerns

## 2024-10-16 NOTE — ED Notes (Signed)
 Pt returned from radiology, MD at bedside.

## 2024-10-16 NOTE — ED Notes (Signed)
 Pt to MRI
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
## 2024-10-16 NOTE — ED Notes (Signed)
 Pt to xray
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
## 2024-10-19 DIAGNOSIS — E559 Vitamin D deficiency, unspecified: Secondary | ICD-10-CM | POA: Diagnosis not present

## 2024-10-19 DIAGNOSIS — I10 Essential (primary) hypertension: Secondary | ICD-10-CM | POA: Diagnosis not present

## 2024-10-19 DIAGNOSIS — E1165 Type 2 diabetes mellitus with hyperglycemia: Secondary | ICD-10-CM | POA: Diagnosis not present

## 2024-10-19 DIAGNOSIS — I2510 Atherosclerotic heart disease of native coronary artery without angina pectoris: Secondary | ICD-10-CM | POA: Diagnosis not present

## 2024-10-19 DIAGNOSIS — E782 Mixed hyperlipidemia: Secondary | ICD-10-CM | POA: Diagnosis not present

## 2024-10-19 DIAGNOSIS — K760 Fatty (change of) liver, not elsewhere classified: Secondary | ICD-10-CM | POA: Diagnosis not present

## 2025-01-05 NOTE — Progress Notes (Addendum)
 Fracture Prevention Program- New Patient Evaluation  Initial Visit: Shannon Rosario is here for a bone health evaluation for secondary fracture prevention, osteoporosis, and/or risk for future fractures. I have summarized  medical history, medications, and fracture history today.  Referring provider:  No care team member to display   Was patient referred to the Fracture Prevention Program [facility]) for current fracture? No  Was patient evaluated by the Fracture Prevention Program (FPP) within 6 weeks post fracture? No- other reason: No fractures  HPI: History of Present Illness 01/05/2025.  Shannon Rosario is a pleasant, 64 year old female who presents for evaluation and treatment of osteoporosis. She was diagnosed with osteopenia in her late 7s. Upon relocating to this area in 2015, her new physicians discontinued her Fosamax prescription, which she had been taking for over a year. A bone scan conducted in 04/2024 revealed osteoporosis. Approximately 4 to 6 months ago, Dr. Catalina reintroduced Fosamax into her treatment regimen, and she restarted alendronate in 11/2024. Prior to her move, she was prescribed Boniva once a month in [place] . She typically undergoes mammograms and bone density scans every other year. She has not consulted a dentist in 2 years, with her most recent dental work involving crowns. She has not experienced any fractures since the age of 22. She is considering switching to Reclast infusion once a year if her insurance covers it.  She has a history of low vitamin D levels and takes over-the-counter vitamin D supplements, as her prescription was not refilled. Her last high-dose vitamin D intake was over a year ago. She also takes magnesium  supplements.  She has mild heart issues with a small blockage in some part of her heart. She has type 2 diabetes and is not currently using Dexcom but expresses a desire to do so. She has reflux.  PAST SURGICAL HISTORY: She  broke her right foot at age 56 and her right wrist before age 70.    Due to concerns for compromised bone quality and strength and risk of future fractures patient was appropriately identified and referred for further investigation and treatment recommendations for improved bone quality and strength and reduction of future fractures.  No past medical history on file.  Social History: Married. Other Significant Medical History: T2DM.  Depression.  Iron deficiency anemia. Significant Dental History: Multiple crowns.  Has not seen a dentist in 2 years.  Prior Bone Health Treatment: Boniva.  Fosamax.  Bone Health History: 1.  Have you had height loss or gotten shorter since your 20's? Yes; best estimate of height loss - 2 inches  2.  Are you postmenopausal? Yes, age 64, naturally  3.  Have you ever had a low testosterone level in the past? N/A  4.  Did you ever take hormone replacements? No  5.  Current fracture? No  6. Any other broken bones since you turned 49 or older? No  7. Are you a Vegetarian or Vegan? No  8. Currently smoke/use smokeless tobacco? No. Former smoker? No  9. Alcohol use? No  10. Have you had more than 2 falls in the past year? No  11.  How active have you been for the last 12 months? Not active  12.  How many caffeinated beverages do you have per day? less than 3  13. Have you ever been diagnosed with any of the following:                   RA: No  Lupus: No                  Celiac Disease or Absorption Disorder: No                  Gastric Bypass: No                  COPD: No                  GERD (Heartburn or Acid Reflux): Yes                  Parathyroid Gland Issues: No                  Thyroid  Gland Issues: No                  Diabetes: Yes - T2DM                  Chronic Kidney Disease: No                  Kidney Stones: No                  Seizure Disorder: No                  HIV/AIDS: No                  Hepatitis B: No                   Hepatitis C: No                  Paget's Disease: No                  Depression: Yes                  Dementia: No                  Cancer: No  14.  Did either of your parents have a hip fracture after the age of 13 or diagnosis of osteoporosis? No  15. Any other family history of osteoporosis? No  16. Are you taking the nutritional supplements Calcium  or Vitamin D? Yes - Inconsistent calcium  intake. Has not had vitamin D 50,000 IU weekly in over a year.  17. Are you on any high-risk medications? Stomach medications: Omeprazole  18. History of steroid use (greater than 3 months total)? No  19. Low body weight (<127 lbs) No  20. Are you currently or have you ever taken any medication for bone loss?  Yes  21. Have you had a bone density test? Yes  22. Have you had any cardiovascular events, such as stroke or heart attack, in the past year? No. Has recently had cardiac workup.  Has CAD.      DXA Results:   Unavailable  Review of Systems: CONSTITUTIONAL: Appetite good, no fevers, night sweats or weight loss EYES: No vision changes, vision pain, vision redness, blurriness or double vision ENT: No ringing in ears, nasal discharge, nasal stuffiness, sore throat or hoarseness CV: No chest pain, shortness of breath or leg swelling RESPIRATORY: No cough, wheezing or dyspnea GI: No nausea, vomiting, abdominal pain or change in bowel habits GU: No dysuria, urgency or incontinence MS: No muscle pain, no joint pain, no joint stiffness, no swelling of joints, no redness of joints SKIN: No rashes or lesions NEURO: No  mental status changes, no motor weakness, no sensory changes PSYCH: No nervousness, stress, depression or memory loss ENDOCRINE: No weight gain, hair loss or change in urinary frequency HEME/LYMPH: No easy bleeding, no easy bruising   Estimated body mass index is 38.62 kg/m as calculated from the following:   Height as of this encounter: 1.727 m (5' 8).   Weight as of this  encounter: 115 kg (254 lb).    OBJECTIVE: General Appearance: Well developed, well nourished, alert and cooperative, and appears to be in no acute distress. Appears stated age. Affect appropriate. Head: Normocephalic/atraumatic. Eyes: EOMI bilaterally. Vision is grossly intact. Ears: External auditory canals clear bilaterally. Hearing grossly intact bilaterally. Nose: Nares patent bilaterally, no nasal discharge. Throat: Moist mucous membranes. Teeth and gingiva in good general condition. Neck: Neck supple, non-tender without lymphadenopathy, masses or thyromegaly. CV: There is no peripheral edema, cyanosis or pallor. Extremities are warm and well perfused. Respiratory: Respirations nonlabored. Normal, symmetric chest wall expansion bilaterally. Abdomen: Soft, nondistended, nontender. No guarding or rebound.  No masses. Extremities: No significant deformity or joint abnormality. No edema. Peripheral pulses intact. Neurological: Strength and sensation symmetric and intact throughout. Reflexes 2+ throughout. Skin: Skin normal color, texture and turgor with no lesions or eruptions. Psychiatric: The mental examination revealed the patient was oriented to person, place, and time. The patient was able to demonstrate good judgment and reason, without hallucinations, abnormal affect or abnormal behaviors during the examination. Patient is not suicidal.   ASSESSMENT: The patient was seen today for fracture program prevention.  Diagnoses and all orders for this visit:  1. Age-related osteoporosis without current pathological fracture  Parathyroid Hormone (PTH), Intact   Phosphorus   Vitamin D, 25-Hydroxy   Basic Metabolic Panel    2. Encounter for monitoring alendronate therapy      3. Counseling on health promotion and disease prevention         Did patient receive osteoporosis education and osteoporosis treatment education? Yes  PLAN:  I spent 30 minutes face to face with patient today  discussing the disease management of osteoporosis for the reduction of future fractures. I have explained the bone strength is equal to bone quality and density so a DXA scan is important to compliment care and for treatment guidance. Over half of the encounter time (50%) was spent counseling patient on the disease of osteoporosis, evidenced based best practice treatment options available and recommendations for improved bone quality, importance of nutritional supplements of Calcium  1000-1200mg  daily in divided doses (or from diet sources) and Vitamin D 2000-5000 units daily for improved bone quality, falls prevention and personal safety for the reduction of future fractures. We also discussed weight bearing exercise 3-4 times per week with the addition of resistance exercises. Clinicians Guidelines to treatment and management of osteoporosis using the most up to date recommendations from the Bone Health Osteoporosis Foundation utilized as they are evidence based. Patient was given handouts regarding bone health, osteoporosis, and all available treatment options including pharmacological interventions. Bone Density test recommended. Basic Bone Health labs will be ordered.  Assessment & Plan 1. Osteoporosis: - She has a history of osteopenia, which has progressed to osteoporosis. - She restarted Fosamax in 11/2024 after a recent bone density scan showed osteoporosis. - A baseline vitamin D level will be obtained today. She is advised to take Viactiv chews twice daily, each containing calcium  600 mg, vitamin D3 500 IU, and vitamin K2.  Radiologic findings discussion about treatment options today - If her insurance  covers it, she may switch to Reclast (zoledronic acid) infusion once a year, which would allow her to discontinue alendronate. Potential side effects of Reclast, such as flu-like symptoms, were discussed, and she was advised to take acetaminophen  or Tylenol  and hydrate well on the day of the  infusion and the following day.  2. Vitamin D deficiency: - She has a history of low vitamin D levels. - A baseline vitamin D level will be obtained today. If her vitamin D level is normal, she is advised to take 2000 IU of vitamin D daily.  Follow-up 1 year if she switches to zoledronate.  Patient may call for any questions or concerns.    All patients with osteoporosis as defined by a fragility fracture, low bone mass (osteopenia with High FRAX) or osteoporosis on BMD treatment is recommended to reduce the risk for future fractures. For Prevention of osteoporosis prior to a fracture, (Note: The National Osteoporosis Foundation recommends pharmacological treatment of all postmenopausal women with T-scores below -2.0, and those with T-scores below -1.5 with risk factors for osteoporosis. Osteoporosis is present when the T-score is below -2.5. The T-score is defined as the number of standard deviations above or below the average BMD value for young, healthy white women.) The BMD is not the only way to define osteoporosis so treatment should be based on patient history, risk and other factors as appropriate.
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
## 2025-01-14 ENCOUNTER — Ambulatory Visit: Attending: Orthopedic Surgery

## 2025-01-14 DIAGNOSIS — M47816 Spondylosis without myelopathy or radiculopathy, lumbar region: Secondary | ICD-10-CM | POA: Insufficient documentation

## 2025-01-14 DIAGNOSIS — Z7409 Other reduced mobility: Secondary | ICD-10-CM | POA: Insufficient documentation

## 2025-01-14 DIAGNOSIS — M7062 Trochanteric bursitis, left hip: Secondary | ICD-10-CM | POA: Diagnosis present

## 2025-01-14 DIAGNOSIS — M5459 Other low back pain: Secondary | ICD-10-CM | POA: Diagnosis present

## 2025-01-14 DIAGNOSIS — M25552 Pain in left hip: Secondary | ICD-10-CM | POA: Diagnosis present

## 2025-01-14 DIAGNOSIS — M6281 Muscle weakness (generalized): Secondary | ICD-10-CM | POA: Diagnosis present
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
## 2025-01-19 ENCOUNTER — Ambulatory Visit: Admitting: Physical Therapy

## 2025-01-19 ENCOUNTER — Encounter: Payer: Self-pay | Admitting: Physical Therapy

## 2025-01-19 DIAGNOSIS — M5459 Other low back pain: Secondary | ICD-10-CM

## 2025-01-19 DIAGNOSIS — M6281 Muscle weakness (generalized): Secondary | ICD-10-CM | POA: Diagnosis not present

## 2025-01-19 DIAGNOSIS — M47816 Spondylosis without myelopathy or radiculopathy, lumbar region: Secondary | ICD-10-CM

## 2025-01-19 DIAGNOSIS — Z7409 Other reduced mobility: Secondary | ICD-10-CM
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
## 2025-01-21 ENCOUNTER — Encounter: Payer: Self-pay | Admitting: Physical Therapy

## 2025-01-21 ENCOUNTER — Ambulatory Visit: Admitting: Physical Therapy

## 2025-01-21 DIAGNOSIS — M6281 Muscle weakness (generalized): Secondary | ICD-10-CM

## 2025-01-21 DIAGNOSIS — M47816 Spondylosis without myelopathy or radiculopathy, lumbar region: Secondary | ICD-10-CM

## 2025-01-21 DIAGNOSIS — Z7409 Other reduced mobility: Secondary | ICD-10-CM

## 2025-01-21 DIAGNOSIS — M5459 Other low back pain: Secondary | ICD-10-CM
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
## 2025-01-26 ENCOUNTER — Ambulatory Visit

## 2025-01-26 DIAGNOSIS — M542 Cervicalgia: Secondary | ICD-10-CM

## 2025-01-26 DIAGNOSIS — M5416 Radiculopathy, lumbar region: Secondary | ICD-10-CM

## 2025-01-26 DIAGNOSIS — M7062 Trochanteric bursitis, left hip: Secondary | ICD-10-CM

## 2025-01-26 DIAGNOSIS — G8929 Other chronic pain: Secondary | ICD-10-CM

## 2025-01-26 DIAGNOSIS — M5459 Other low back pain: Secondary | ICD-10-CM

## 2025-01-26 DIAGNOSIS — M6281 Muscle weakness (generalized): Secondary | ICD-10-CM

## 2025-01-26 DIAGNOSIS — Z7409 Other reduced mobility: Secondary | ICD-10-CM

## 2025-01-26 DIAGNOSIS — M47816 Spondylosis without myelopathy or radiculopathy, lumbar region: Secondary | ICD-10-CM

## 2025-01-26 DIAGNOSIS — M25552 Pain in left hip: Secondary | ICD-10-CM

## 2025-01-26 DIAGNOSIS — R262 Difficulty in walking, not elsewhere classified: Secondary | ICD-10-CM

## 2025-01-26 DIAGNOSIS — R252 Cramp and spasm: Secondary | ICD-10-CM
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
## 2025-01-28 ENCOUNTER — Ambulatory Visit: Admitting: Physical Therapy

## 2025-01-28 ENCOUNTER — Encounter: Payer: Self-pay | Admitting: Physical Therapy

## 2025-01-28 DIAGNOSIS — M47816 Spondylosis without myelopathy or radiculopathy, lumbar region: Secondary | ICD-10-CM

## 2025-01-28 DIAGNOSIS — R262 Difficulty in walking, not elsewhere classified: Secondary | ICD-10-CM

## 2025-01-28 DIAGNOSIS — G8929 Other chronic pain: Secondary | ICD-10-CM

## 2025-01-28 DIAGNOSIS — M6281 Muscle weakness (generalized): Secondary | ICD-10-CM
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
## 2025-02-02 ENCOUNTER — Ambulatory Visit: Admitting: Physical Therapy

## 2025-02-04 ENCOUNTER — Ambulatory Visit: Admitting: Physical Therapy

## 2025-02-09 ENCOUNTER — Ambulatory Visit: Admitting: Physical Therapy

## 2025-02-11 ENCOUNTER — Ambulatory Visit: Admitting: Physical Therapy

## 2025-02-16 ENCOUNTER — Ambulatory Visit: Admitting: Physical Therapy

## 2025-02-18 ENCOUNTER — Ambulatory Visit: Admitting: Physical Therapy
